# Patient Record
Sex: Female | Born: 1950 | Race: White | Hispanic: No | Smoking: Former smoker
Health system: Southern US, Community
[De-identification: ages and names within clinical notes are randomized; demographics above are authoritative.]

## PROBLEM LIST (undated history)

## (undated) DIAGNOSIS — E039 Hypothyroidism, unspecified: Secondary | ICD-10-CM

## (undated) DIAGNOSIS — I10 Essential (primary) hypertension: Secondary | ICD-10-CM

## (undated) DIAGNOSIS — E119 Type 2 diabetes mellitus without complications: Secondary | ICD-10-CM

## (undated) DIAGNOSIS — E785 Hyperlipidemia, unspecified: Secondary | ICD-10-CM

## (undated) DIAGNOSIS — R011 Cardiac murmur, unspecified: Secondary | ICD-10-CM

## (undated) HISTORY — DX: Type 2 diabetes mellitus without complications: E11.9

## (undated) HISTORY — DX: Cardiac murmur, unspecified: R01.1

## (undated) HISTORY — DX: Hypothyroidism, unspecified: E03.9

## (undated) HISTORY — DX: Essential (primary) hypertension: I10

## (undated) HISTORY — PX: BREAST SURGERY: SHX581

## (undated) HISTORY — DX: Hyperlipidemia, unspecified: E78.5

---

## 1989-04-22 HISTORY — PX: ROBOTIC ASSISTED LAPAROSCOPIC VAGINAL HYSTERECTOMY WITH FIBROID REMOVAL: SHX5387

## 1997-09-29 ENCOUNTER — Other Ambulatory Visit: Admission: RE | Admit: 1997-09-29 | Discharge: 1997-09-29 | Payer: Self-pay | Admitting: Obstetrics and Gynecology

## 1998-04-22 HISTORY — PX: ABDOMINAL HYSTERECTOMY: SHX81

## 1998-08-09 ENCOUNTER — Other Ambulatory Visit: Admission: RE | Admit: 1998-08-09 | Discharge: 1998-08-09 | Payer: Self-pay | Admitting: Obstetrics and Gynecology

## 1998-09-11 ENCOUNTER — Other Ambulatory Visit: Admission: RE | Admit: 1998-09-11 | Discharge: 1998-09-11 | Payer: Self-pay | Admitting: Obstetrics and Gynecology

## 1998-10-03 ENCOUNTER — Inpatient Hospital Stay (HOSPITAL_COMMUNITY): Admission: RE | Admit: 1998-10-03 | Discharge: 1998-10-06 | Payer: Self-pay | Admitting: Obstetrics and Gynecology

## 2000-01-30 ENCOUNTER — Other Ambulatory Visit: Admission: RE | Admit: 2000-01-30 | Discharge: 2000-01-30 | Payer: Self-pay | Admitting: Obstetrics and Gynecology

## 2009-04-22 DIAGNOSIS — C50919 Malignant neoplasm of unspecified site of unspecified female breast: Secondary | ICD-10-CM | POA: Insufficient documentation

## 2009-04-22 HISTORY — PX: MASTECTOMY: SHX3

## 2009-04-22 HISTORY — DX: Malignant neoplasm of unspecified site of unspecified female breast: C50.919

## 2017-04-22 HISTORY — PX: CATARACT EXTRACTION: SUR2

## 2017-12-29 LAB — HM DEXA SCAN

## 2019-03-30 LAB — CBC AND DIFFERENTIAL
HCT: 40 (ref 36–46)
Hemoglobin: 13.6 (ref 12.0–16.0)
Platelets: 230 (ref 150–399)
WBC: 6.6

## 2019-03-30 LAB — CBC: RBC: 4.63 (ref 3.87–5.11)

## 2019-03-30 LAB — BASIC METABOLIC PANEL
BUN: 12 (ref 4–21)
CO2: 28 — AB (ref 13–22)
Chloride: 95 — AB (ref 99–108)
Creatinine: 0.7 (ref 0.5–1.1)
Glucose: 125
Potassium: 4.1 (ref 3.4–5.3)
Sodium: 132 — AB (ref 137–147)

## 2019-03-30 LAB — HEPATIC FUNCTION PANEL
ALT: 14 (ref 7–35)
AST: 15 (ref 13–35)
Alkaline Phosphatase: 51 (ref 25–125)
Bilirubin, Total: 0.6

## 2019-03-30 LAB — HEMOGLOBIN A1C: Hemoglobin A1C: 6.9

## 2019-03-31 LAB — COMPREHENSIVE METABOLIC PANEL
Albumin: 4.7 (ref 3.5–5.0)
Calcium: 10 (ref 8.7–10.7)
GFR calc Af Amer: 100
GFR calc non Af Amer: 86
Globulin: 2.6

## 2019-03-31 LAB — LIPID PANEL
Cholesterol: 153 (ref 0–200)
HDL: 64 (ref 35–70)
LDL Cholesterol: 69
Triglycerides: 110 (ref 40–160)

## 2019-03-31 LAB — MICROALBUMIN, URINE: Microalb, Ur: NORMAL

## 2019-07-22 HISTORY — PX: BELPHAROPTOSIS REPAIR: SHX369

## 2019-11-05 LAB — MICROALBUMIN, URINE: Microalb, Ur: NORMAL

## 2019-11-05 LAB — HEMOGLOBIN A1C: Hemoglobin A1C: 7.5

## 2020-02-08 ENCOUNTER — Other Ambulatory Visit: Payer: Self-pay

## 2020-02-08 ENCOUNTER — Encounter: Payer: Self-pay | Admitting: Physician Assistant

## 2020-02-08 ENCOUNTER — Ambulatory Visit (INDEPENDENT_AMBULATORY_CARE_PROVIDER_SITE_OTHER): Payer: Medicare Other | Admitting: Physician Assistant

## 2020-02-08 VITALS — BP 152/85 | HR 86 | Temp 97.7°F | Ht 65.0 in | Wt 201.4 lb

## 2020-02-08 DIAGNOSIS — I10 Essential (primary) hypertension: Secondary | ICD-10-CM | POA: Insufficient documentation

## 2020-02-08 DIAGNOSIS — I700 Atherosclerosis of aorta: Secondary | ICD-10-CM | POA: Insufficient documentation

## 2020-02-08 DIAGNOSIS — E119 Type 2 diabetes mellitus without complications: Secondary | ICD-10-CM | POA: Insufficient documentation

## 2020-02-08 DIAGNOSIS — M8580 Other specified disorders of bone density and structure, unspecified site: Secondary | ICD-10-CM | POA: Insufficient documentation

## 2020-02-08 DIAGNOSIS — E039 Hypothyroidism, unspecified: Secondary | ICD-10-CM | POA: Insufficient documentation

## 2020-02-08 DIAGNOSIS — E785 Hyperlipidemia, unspecified: Secondary | ICD-10-CM | POA: Insufficient documentation

## 2020-02-08 DIAGNOSIS — I890 Lymphedema, not elsewhere classified: Secondary | ICD-10-CM | POA: Insufficient documentation

## 2020-02-08 DIAGNOSIS — R011 Cardiac murmur, unspecified: Secondary | ICD-10-CM | POA: Insufficient documentation

## 2020-02-08 DIAGNOSIS — R918 Other nonspecific abnormal finding of lung field: Secondary | ICD-10-CM | POA: Insufficient documentation

## 2020-02-08 NOTE — Patient Instructions (Signed)
It was great to see you!  Please make an appointment with the lab on your way out. I would like for you to return for lab work within 1-2 weeks. After midnight on the day of the lab draw, please do not eat anything. You may have water, black coffee, unsweetened tea.  Please make an appointment for no sooner than 2 days after labwork for [place] to review your labs in the office and review your medications.  Take care,  Inda Coke PA-C

## 2020-02-08 NOTE — Progress Notes (Signed)
Amanda Rollins is a 69 y.o. female here for a new problem.  History of Present Illness:   Chief Complaint  Patient presents with   Establish Care   Weight Gain    HPI   Diabetes Current DM meds: amaryl 4 mg daily. Has been on metformin prior but had significant diarrhea -- cannot tolerate. Patient is compliant with medications. Denies: hypoglycemic or hyperglycemic episodes or symptoms. This patient's diabetes is complicated by HTN and HLD.  Osteopenia DEXA showed osteoporosis, per patient. She has been on fosamax for a few months and is tolerating well so far. We do not have record of most recent DEXA.  HTN Currently taking Norvasc 5 mg, Losartan-HCTZ 100-12.5 mg. At home blood pressure readings are: well controlled per patient report. Patient denies chest pain, SOB, blurred vision, dizziness, unusual headaches, lower leg swelling. Patient is compliant with medication. Denies excessive caffeine intake, stimulant usage, excessive alcohol intake, or increase in salt consumption.  BP Readings from Last 3 Encounters:  02/08/20 (!) 152/85   HLD Currently on crestor 5 mg daily. Has been on for years. Tolerating well, denies myalgias.  Hypothyroidism Currently on synthroid 50 mcg daily. Tolerating well. Reports that her labs have been stable for a few years.  Past Medical History:  Diagnosis Date   Breast cancer ([place]) 2011   Diabetes mellitus type 2, controlled ([place])    Heart murmur    Hyperlipidemia    Hypertension    Hypothyroidism      Social History   Tobacco Use   Smoking status: Former Smoker    Start date: 02/07/1970   Smokeless tobacco: Never Used  Substance Use Topics   Alcohol use: Yes    Alcohol/week: 3.0 standard drinks    Types: 3 Glasses of wine per week   Drug use: Never    Past Surgical History:  Procedure Laterality Date   ABDOMINAL HYSTERECTOMY  2000   Still has ovaries   BELPHAROPTOSIS REPAIR  07/2019   BREAST SURGERY      CATARACT EXTRACTION  2019   Left eye 2019, Right eye 2020   CESAREAN SECTION  12/08/1990   MASTECTOMY  2011   Bilateral   ROBOTIC ASSISTED LAPAROSCOPIC VAGINAL HYSTERECTOMY WITH FIBROID REMOVAL  1991    Family History  Adopted: Yes  Problem Relation Age of Onset   Cancer Mother        small cell cancer of liver and lungs    No Known Allergies  Current Medications:   Current Outpatient Medications:    alendronate (FOSAMAX) 70 MG tablet, Take 70 mg by mouth once a week., Disp: , Rfl:    amLODipine (NORVASC) 5 MG tablet, Take 5 mg by mouth daily., Disp: , Rfl:    calcium-vitamin D (OSCAL WITH D) 250-125 MG-UNIT tablet, Take 1 tablet by mouth daily., Disp: , Rfl:    glimepiride (AMARYL) 4 MG tablet, Take 4 mg by mouth daily with breakfast., Disp: , Rfl:    levothyroxine (SYNTHROID) 50 MCG tablet, Take 50 mcg by mouth daily., Disp: , Rfl:    losartan-hydrochlorothiazide (HYZAAR) 100-12.5 MG tablet, Take 1 tablet by mouth daily., Disp: , Rfl:    magnesium gluconate (MAGONATE) 500 MG tablet, Take 500 mg by mouth 2 (two) times daily., Disp: , Rfl:    rosuvastatin (CRESTOR) 5 MG tablet, Take 5 mg by mouth daily., Disp: , Rfl:    Review of Systems:   ROS  Negative unless otherwise specified per HPI.  Vitals:  Vitals:   02/08/20 1034  BP: (!) 152/85  Pulse: 86  Temp: 97.7 F (36.5 C)  TempSrc: Temporal  SpO2: 97%  Weight: 201 lb 6.4 oz (91.4 kg)  Height: 5\' 5"  (1.651 m)     Body mass index is 33.51 kg/m.  Physical Exam:   Physical Exam Vitals and nursing note reviewed.  Constitutional:      General: She is not in acute distress.    Appearance: She is well-developed. She is not ill-appearing or toxic-appearing.  Cardiovascular:     Rate and Rhythm: Normal rate and regular rhythm.     Pulses: Normal pulses.     Heart sounds: Normal heart sounds, S1 normal and S2 normal.     Comments: No LE edema Pulmonary:     Effort: Pulmonary effort is normal.      Breath sounds: Normal breath sounds.  Skin:    General: Skin is warm and dry.     Findings: Erythema:    Neurological:     Mental Status: She is alert.     GCS: GCS eye subscore is 4. GCS verbal subscore is 5. GCS motor subscore is 6.  Psychiatric:        Speech: Speech normal.        Behavior: Behavior normal. Behavior is cooperative.     No results found for this or any previous visit.  Assessment and Plan:   B[place] was seen today for establish care and weight gain.  Diagnoses and all orders for this visit:  Controlled type 2 diabetes mellitus without complication, without long-term current use of insulin ([place]) She would like for [place] to await her records (which were done in June 2021?) to determine if she needs repeat labs now or in January.  Update HgbA1c when indicated. Will trial stopping glimepiride and adding GLP-1 for weight loss if agreeable.  Primary hypertension Currently above goal. Will continue current regimen of Losartan 100 - HCTZ 12.5 mg and Norvasc 5 mg daily. If remains elevated at follow-up, will increase Norvasc to 10 mg daily. If BP consistently > 150/90, return to office, otherwise follow-up for scheduled follow-ups.  Hyperlipidemia, unspecified hyperlipidemia type She would like for [place] to await her records (which were done in June 2021?) to determine if she needs repeat labs now or in January.  Will adjust crestor 5 mg as indicated.  Acquired hypothyroidism She would like for [place] to await her records (which were done in June 2021?) to determine if she needs repeat labs now or in January.  Will adjust synthroid 50 mcg as indicated.  Osteopenia, unspecified location Has been on fosamax for a few months. Will await records to review when last DEXA was to schedule next.  Time spent with patient today was 50 minutes which consisted of chart review, discussing diagnosis, work up, treatment answering questions and documentation.   Inda Coke, PA-C

## 2020-02-13 ENCOUNTER — Encounter: Payer: Self-pay | Admitting: Physician Assistant

## 2020-03-27 ENCOUNTER — Other Ambulatory Visit: Payer: Self-pay

## 2020-03-27 ENCOUNTER — Other Ambulatory Visit: Payer: Self-pay | Admitting: Physician Assistant

## 2020-03-27 ENCOUNTER — Encounter: Payer: Self-pay | Admitting: Physician Assistant

## 2020-03-27 ENCOUNTER — Ambulatory Visit (INDEPENDENT_AMBULATORY_CARE_PROVIDER_SITE_OTHER): Payer: Medicare Other | Admitting: Physician Assistant

## 2020-03-27 VITALS — BP 151/76 | HR 82 | Temp 97.8°F | Ht 65.0 in | Wt 197.6 lb

## 2020-03-27 DIAGNOSIS — M8580 Other specified disorders of bone density and structure, unspecified site: Secondary | ICD-10-CM

## 2020-03-27 DIAGNOSIS — I10 Essential (primary) hypertension: Secondary | ICD-10-CM | POA: Diagnosis not present

## 2020-03-27 DIAGNOSIS — E119 Type 2 diabetes mellitus without complications: Secondary | ICD-10-CM

## 2020-03-27 DIAGNOSIS — E785 Hyperlipidemia, unspecified: Secondary | ICD-10-CM

## 2020-03-27 DIAGNOSIS — Z23 Encounter for immunization: Secondary | ICD-10-CM | POA: Diagnosis not present

## 2020-03-27 DIAGNOSIS — E039 Hypothyroidism, unspecified: Secondary | ICD-10-CM

## 2020-03-27 MED ORDER — ALENDRONATE SODIUM 70 MG PO TABS: 70.0000 mg | ORAL_TABLET | ORAL | 1 refills | Status: DC

## 2020-03-27 MED ORDER — AMLODIPINE BESYLATE 5 MG PO TABS: 5.0000 mg | ORAL_TABLET | Freq: Every day | ORAL | 3 refills | Status: DC

## 2020-03-27 MED ORDER — LOSARTAN POTASSIUM-HCTZ 100-12.5 MG PO TABS: 1.0000 | ORAL_TABLET | Freq: Every day | ORAL | 3 refills | Status: DC

## 2020-03-27 MED ORDER — FLUTICASONE PROPIONATE 50 MCG/ACT NA SUSP: 2.0000 | Freq: Every day | NASAL | 2 refills | Status: DC

## 2020-03-27 NOTE — Progress Notes (Signed)
Amanda Rollins is a 69 y.o. female here for a new problem.  History of Present Illness:   Chief Complaint  Patient presents with  . Ear Pain    Bilateral ear pain, pt states 2 weeks ago when she was flying once she got in the air it felt like her ears were going to explode. States her ears has been itching, burning and a ringing noise   . Medication Refill    Fosamax, Crestor and Norvasc    HPI   Ear pain Bilateral ear pain, pt states 2 weeks ago when she was flying once she got in the air it felt like her ears were going to explode. States her ears has been itching, burning and a ringing noise. Has constant noise at baseline so this is not new for her. This happened to her once in the past when she was in airplane travel as well, went away on its own. Denies: nasal congestion, HA, dizziness.  HTN Currently taking Losartan-HCTZ 100-12.5 mg, Norvasc 5 mg. At home blood pressure readings are: not checked. Patient denies chest pain, SOB, blurred vision, dizziness, unusual headaches, lower leg swelling. Patient is compliant with medication -- except for this week she has been out of norvasc 5 mg daily. Denies excessive caffeine intake, stimulant usage, excessive alcohol intake, or increase in salt consumption.  BP Readings from Last 3 Encounters:  03/27/20 (!) 151/76  02/08/20 (!) 152/85   HLD Currently on crestor 5 mg daily. Tolerates without any issues. Due for lipid recheck.  Hypothyroidism Currently on levothyroxine 50 mcg daily. Tolerating well. Would like her TSH rechecked today. Denies any concerning symptoms regarding lack of thyroid control (dry hair, changes in skin, etc.)  Diabetes 6 month follow-up. Current DM meds: amaryl 4 mg daily. Blood sugars at home are: not checked. Patient is compliant with medications. Denies: hypoglycemic or hyperglycemic episodes or symptoms.  Lab Results  Component Value Date   HGBA1C 6.9 03/30/2019   Osteopenia Last DEXA was Sep 2019,  showed osteopenia. She is taking fosamax regularly, denies concerns with this medication. Takes regular Vit D and calcium supplement.    Past Medical History:  Diagnosis Date  . Breast cancer ([place]) 2011  . Diabetes mellitus type 2, controlled ([place])   . Heart murmur   . Hyperlipidemia   . Hypertension   . Hypothyroidism      Social History   Tobacco Use  . Smoking status: Former Smoker    Start date: 02/07/1970  . Smokeless tobacco: Never Used  Substance Use Topics  . Alcohol use: Yes    Alcohol/week: 3.0 standard drinks    Types: 3 Glasses of wine per week  . Drug use: Never    Past Surgical History:  Procedure Laterality Date  . ABDOMINAL HYSTERECTOMY  2000   Still has ovaries  . [place]  07/2019  . BREAST SURGERY    . CATARACT EXTRACTION  2019   Left eye 2019, Right eye 2020  . CESAREAN SECTION  12/08/1990  . MASTECTOMY  2011   Bilateral  . ROBOTIC ASSISTED LAPAROSCOPIC VAGINAL HYSTERECTOMY WITH FIBROID REMOVAL  1991    Family History  Adopted: Yes  Problem Relation Age of Onset  . Cancer Mother        small cell cancer of liver and lungs    No Known Allergies  Current Medications:   Current Outpatient Medications:  .  alendronate (FOSAMAX) 70 MG tablet, Take 1 tablet (70 mg total) by mouth  once a week., Disp: 30 tablet, Rfl: 1 .  amLODipine (NORVASC) 5 MG tablet, Take 1 tablet (5 mg total) by mouth daily., Disp: 90 tablet, Rfl: 3 .  calcium-vitamin D (OSCAL WITH D) 250-125 MG-UNIT tablet, Take 1 tablet by mouth daily., Disp: , Rfl:  .  glimepiride (AMARYL) 4 MG tablet, Take 4 mg by mouth daily with breakfast., Disp: , Rfl:  .  levothyroxine (SYNTHROID) 50 MCG tablet, Take 50 mcg by mouth daily., Disp: , Rfl:  .  losartan-hydrochlorothiazide (HYZAAR) 100-12.5 MG tablet, Take 1 tablet by mouth daily., Disp: 90 tablet, Rfl: 3 .  magnesium gluconate (MAGONATE) 500 MG tablet, Take 500 mg by mouth 2 (two) times daily., Disp: , Rfl:  .   rosuvastatin (CRESTOR) 5 MG tablet, Take 5 mg by mouth daily., Disp: , Rfl:  .  fluticasone (FLONASE) 50 MCG/ACT nasal spray, Place 2 sprays into both nostrils daily., Disp: 16 g, Rfl: 2   Review of Systems:   ROS Negative unless otherwise specified per HPI.  Vitals:   Vitals:   03/27/20 0932  BP: (!) 151/76  Pulse: 82  Temp: 97.8 F (36.6 C)  TempSrc: Temporal  SpO2: 97%  Weight: 197 lb 9.6 oz (89.6 kg)  Height: 5\' 5"  (1.651 m)     Body mass index is 32.88 kg/m.  Physical Exam:   Physical Exam Vitals and nursing note reviewed.  Constitutional:      General: She is not in acute distress.    Appearance: She is well-developed. She is not ill-appearing or toxic-appearing.  HENT:     Head: Normocephalic and atraumatic.     Right Ear: Tympanic membrane, ear canal and external ear normal. Tympanic membrane is not erythematous, retracted or bulging.     Left Ear: Tympanic membrane, ear canal and external ear normal. Tympanic membrane is not erythematous, retracted or bulging.     Nose: Nose normal.     Right Sinus: No maxillary sinus tenderness or frontal sinus tenderness.     Left Sinus: No maxillary sinus tenderness or frontal sinus tenderness.     Mouth/Throat:     Pharynx: Uvula midline. No posterior oropharyngeal erythema.  Eyes:     General: Lids are normal.     Conjunctiva/sclera: Conjunctivae normal.  Neck:     Trachea: Trachea normal.  Cardiovascular:     Rate and Rhythm: Normal rate and regular rhythm.     Pulses: Normal pulses.     Heart sounds: Normal heart sounds, S1 normal and S2 normal.     Comments: No LE edema Pulmonary:     Effort: Pulmonary effort is normal.     Breath sounds: Normal breath sounds. No decreased breath sounds, wheezing, rhonchi or rales.  Lymphadenopathy:     Cervical: No cervical adenopathy.  Skin:    General: Skin is warm and dry.  Neurological:     Mental Status: She is alert.     GCS: GCS eye subscore is 4. GCS verbal subscore  is 5. GCS motor subscore is 6.  Psychiatric:        Speech: Speech normal.        Behavior: Behavior normal. Behavior is cooperative.       Assessment and Plan:   Marieclaire was seen today for ear pain and medication refill.  Diagnoses and all orders for this visit:  Osteopenia, unspecified location Due for updated DEXA. Will repeat DEXA. Continue fosamax, vit D and calcium supplementation. Further recommendations based on DEXA results. -  DG Bone Density; Future  Primary hypertension Above goal today, but has been without norvasc x 1 week. Resume norvasc 5 mg daily and losartan-hctz 100-12.5 mg daily. Follow-up in 4 months, sooner if concerns.  Hyperlipidemia, unspecified hyperlipidemia type Update lipid panel today and adjust crestor 5 mg daily as indicated. -     CBC with Differential/Platelet; Future -     Comprehensive metabolic panel; Future -     Lipid panel; Future  Controlled type 2 diabetes mellitus without complication, without long-term current use of insulin ([facility]) Update A1c today. Likely stop amaryl and trial GLP-1. She is injection-resistant, will possibly sample rybelsus. Follow-up in 4 months, sooner if concerns.  Acquired hypothyroidism Update TSH today and adjust 50 mcg synthroid as indicated by TSH. -     TSH; Future  Other orders -     fluticasone (FLONASE) 50 MCG/ACT nasal spray; Place 2 sprays into both nostrils daily. -     losartan-hydrochlorothiazide (HYZAAR) 100-12.5 MG tablet; Take 1 tablet by mouth daily. -     amLODipine (NORVASC) 5 MG tablet; Take 1 tablet (5 mg total) by mouth daily. -     alendronate (FOSAMAX) 70 MG tablet; Take 1 tablet (70 mg total) by mouth once a week.   Inda Coke, PA-C

## 2020-03-27 NOTE — Patient Instructions (Addendum)
It was great to see you!  For your ears: trial flonase for possible eustachian tube dysfunction.  Labs today.  Follow-up in 4 months.  Please schedule your DEXA scan on the way out.  Eustachian Tube Dysfunction  Eustachian tube dysfunction refers to a condition in which a blockage develops in the narrow passage that connects the middle ear to the back of the nose (eustachian tube). The eustachian tube regulates air pressure in the middle ear by letting air move between the ear and nose. It also helps to drain fluid from the middle ear space. Eustachian tube dysfunction can affect one or both ears. When the eustachian tube does not function properly, air pressure, fluid, or both can build up in the middle ear. What are the causes? This condition occurs when the eustachian tube becomes blocked or cannot open normally. Common causes of this condition include:  Ear infections.  Colds and other infections that affect the nose, mouth, and throat (upper respiratory tract).  Allergies.  Irritation from cigarette smoke.  Irritation from stomach acid coming up into the esophagus (gastroesophageal reflux). The esophagus is the tube that carries food from the mouth to the stomach.  Sudden changes in air pressure, such as from descending in an airplane or scuba diving.  Abnormal growths in the nose or throat, such as: ? Growths that line the nose (nasal polyps). ? Abnormal growth of cells (tumors). ? Enlarged tissue at the back of the throat (adenoids). What increases the risk? You are more likely to develop this condition if:  You smoke.  You are overweight.  You are a child who has: ? Certain birth defects of the mouth, such as cleft palate. ? Large tonsils or adenoids. What are the signs or symptoms? Common symptoms of this condition include:  A feeling of fullness in the ear.  Ear pain.  Clicking or popping noises in the ear.  Ringing in the ear.  Hearing loss.  Loss of  balance.  Dizziness. Symptoms may get worse when the air pressure around you changes, such as when you travel to an area of high elevation, fly on an airplane, or go scuba diving. How is this diagnosed? This condition may be diagnosed based on:  Your symptoms.  A physical exam of your ears, nose, and throat.  Tests, such as those that measure: ? The movement of your eardrum (tympanogram). ? Your hearing (audiometry). How is this treated? Treatment depends on the cause and severity of your condition.  In mild cases, you may relieve your symptoms by moving air into your ears. This is called "popping the ears."  In more severe cases, or if you have symptoms of fluid in your ears, treatment may include: ? Medicines to relieve congestion (decongestants). ? Medicines that treat allergies (antihistamines). ? Nasal sprays or ear drops that contain medicines that reduce swelling (steroids). ? A procedure to drain the fluid in your eardrum (myringotomy). In this procedure, a small tube is placed in the eardrum to:  Drain the fluid.  Restore the air in the middle ear space. ? A procedure to insert a balloon device through the nose to inflate the opening of the eustachian tube (balloon dilation). Follow these instructions at home: Lifestyle  Do not do any of the following until your health care provider approves: ? Travel to high altitudes. ? Fly in airplanes. ? Work in a Pension scheme manager or room. ? Scuba dive.  Do not use any products that contain nicotine or tobacco, such  as cigarettes and e-cigarettes. If you need help quitting, ask your health care provider.  Keep your ears dry. Wear fitted earplugs during showering and bathing. Dry your ears completely after. General instructions  Take over-the-counter and prescription medicines only as told by your health care provider.  Use techniques to help pop your ears as recommended by your health care provider. These may include: ?  Chewing gum. ? Yawning. ? Frequent, forceful swallowing. ? Closing your mouth, holding your nose closed, and gently blowing as if you are trying to blow air out of your nose.  Keep all follow-up visits as told by your health care provider. This is important. Contact a health care provider if:  Your symptoms do not go away after treatment.  Your symptoms come back after treatment.  You are unable to pop your ears.  You have: ? A fever. ? Pain in your ear. ? Pain in your head or neck. ? Fluid draining from your ear.  Your hearing suddenly changes.  You become very dizzy.  You lose your balance. Summary  Eustachian tube dysfunction refers to a condition in which a blockage develops in the eustachian tube.  It can be caused by ear infections, allergies, inhaled irritants, or abnormal growths in the nose or throat.  Symptoms include ear pain, hearing loss, or ringing in the ears.  Mild cases are treated with maneuvers to unblock the ears, such as yawning or ear popping.  Severe cases are treated with medicines. Surgery may also be done (rare). This information is not intended to replace advice given to you by your health care provider. Make sure you discuss any questions you have with your health care provider. Document Revised: 07/29/2017 Document Reviewed: 07/29/2017 Elsevier Patient Education  [place].

## 2020-03-28 ENCOUNTER — Other Ambulatory Visit: Payer: Self-pay | Admitting: Physician Assistant

## 2020-03-28 LAB — CBC WITH DIFFERENTIAL/PLATELET
Absolute Monocytes: 635 cells/uL (ref 200–950)
Basophils Absolute: 73 cells/uL (ref 0–200)
Basophils Relative: 1 %
Eosinophils Absolute: 110 cells/uL (ref 15–500)
Eosinophils Relative: 1.5 %
HCT: 42.1 % (ref 35.0–45.0)
Hemoglobin: 14.1 g/dL (ref 11.7–15.5)
Lymphs Abs: 1409 cells/uL (ref 850–3900)
MCH: 29.1 pg (ref 27.0–33.0)
MCHC: 33.5 g/dL (ref 32.0–36.0)
MCV: 87 fL (ref 80.0–100.0)
MPV: 10.5 fL (ref 7.5–12.5)
Monocytes Relative: 8.7 %
Neutro Abs: 5074 cells/uL (ref 1500–7800)
Neutrophils Relative %: 69.5 %
Platelets: 254 10*3/uL (ref 140–400)
RBC: 4.84 10*6/uL (ref 3.80–5.10)
RDW: 12.4 % (ref 11.0–15.0)
Total Lymphocyte: 19.3 %
WBC: 7.3 10*3/uL (ref 3.8–10.8)

## 2020-03-28 LAB — HEMOGLOBIN A1C
Hgb A1c MFr Bld: 7.5 % of total Hgb — ABNORMAL HIGH (ref <5.7)
Mean Plasma Glucose: 169 mg/dL
eAG (mmol/L): 9.3 mmol/L

## 2020-03-28 LAB — COMPREHENSIVE METABOLIC PANEL
AG Ratio: 1.8 (calc) (ref 1.0–2.5)
ALT: 13 U/L (ref 6–29)
AST: 14 U/L (ref 10–35)
Albumin: 4.5 g/dL (ref 3.6–5.1)
Alkaline phosphatase (APISO): 36 U/L — ABNORMAL LOW (ref 37–153)
BUN: 16 mg/dL (ref 7–25)
CO2: 29 mmol/L (ref 20–32)
Calcium: 10 mg/dL (ref 8.6–10.4)
Chloride: 100 mmol/L (ref 98–110)
Creat: 0.78 mg/dL (ref 0.60–0.93)
Globulin: 2.5 g/dL (calc) (ref 1.9–3.7)
Glucose, Bld: 170 mg/dL — ABNORMAL HIGH (ref 65–99)
Potassium: 4.1 mmol/L (ref 3.5–5.3)
Sodium: 138 mmol/L (ref 135–146)
Total Bilirubin: 0.5 mg/dL (ref 0.2–1.2)
Total Protein: 7 g/dL (ref 6.1–8.1)

## 2020-03-28 LAB — LIPID PANEL
Cholesterol: 167 mg/dL (ref <200)
HDL: 64 mg/dL (ref >=50)
LDL Cholesterol (Calc): 75 mg/dL (calc)
Non-HDL Cholesterol (Calc): 103 mg/dL (calc) (ref <130)
Total CHOL/HDL Ratio: 2.6 (calc) (ref <5.0)
Triglycerides: 181 mg/dL — ABNORMAL HIGH (ref <150)

## 2020-03-28 LAB — TSH: TSH: 2.49 mIU/L (ref 0.40–4.50)

## 2020-03-28 MED ORDER — ROSUVASTATIN CALCIUM 5 MG PO TABS: 5.0000 mg | ORAL_TABLET | Freq: Every day | ORAL | 3 refills | Status: DC

## 2020-03-30 ENCOUNTER — Inpatient Hospital Stay: Admission: RE | Admit: 2020-03-30 | Payer: Medicare Other | Source: Ambulatory Visit

## 2020-04-03 ENCOUNTER — Ambulatory Visit (INDEPENDENT_AMBULATORY_CARE_PROVIDER_SITE_OTHER)
Admission: RE | Admit: 2020-04-03 | Discharge: 2020-04-03 | Disposition: A | Discharge status: Home / Self Care | Payer: Medicare Other | Source: Ambulatory Visit | Attending: Physician Assistant | Admitting: Physician Assistant

## 2020-04-03 ENCOUNTER — Other Ambulatory Visit: Payer: Self-pay

## 2020-04-03 DIAGNOSIS — M8580 Other specified disorders of bone density and structure, unspecified site: Secondary | ICD-10-CM | POA: Diagnosis not present

## 2020-04-04 ENCOUNTER — Encounter: Payer: Self-pay | Admitting: Family Medicine

## 2020-04-04 ENCOUNTER — Other Ambulatory Visit: Payer: Self-pay

## 2020-04-04 ENCOUNTER — Telehealth: Payer: Medicare Other | Admitting: Family Medicine

## 2020-04-04 ENCOUNTER — Ambulatory Visit (INDEPENDENT_AMBULATORY_CARE_PROVIDER_SITE_OTHER): Payer: Medicare Other | Admitting: Family Medicine

## 2020-04-04 VITALS — BP 136/86 | HR 98 | Temp 98.4°F | Ht 65.0 in | Wt 197.0 lb

## 2020-04-04 DIAGNOSIS — L03113 Cellulitis of right upper limb: Secondary | ICD-10-CM | POA: Diagnosis not present

## 2020-04-04 DIAGNOSIS — R519 Headache, unspecified: Secondary | ICD-10-CM

## 2020-04-04 DIAGNOSIS — R229 Localized swelling, mass and lump, unspecified: Secondary | ICD-10-CM

## 2020-04-04 DIAGNOSIS — R110 Nausea: Secondary | ICD-10-CM

## 2020-04-04 MED ORDER — CEPHALEXIN 500 MG PO CAPS: 500.0000 mg | ORAL_CAPSULE | Freq: Four times a day (QID) | ORAL | 0 refills | Status: AC

## 2020-04-04 NOTE — Progress Notes (Signed)
Virtual Visit via Telephone Note  I connected with Amanda Rollins on 04/04/20 at  1:00 PM EST by telephone and verified that I am speaking with the correct person using two identifiers.   I discussed the limitations, risks, security and privacy concerns of performing an evaluation and management service by telephone and the availability of in person appointments. I also discussed with the patient that there may be a patient responsible charge related to this service. The patient expressed understanding and agreed to proceed.  Location patient: home, [name] Location provider: work or home office Participants present for the call: patient, provider Patient did not have a visit with me in the prior 7 days to address this/these issue(s).   History of Present Illness:  Acute telemedicine visit for swelling and redness of the skin: -has hx of mild lymphedema in the R arm from remote surgery for breast ca  -however, starting last night noticed redness and hot, tender and swollen skin in the arm that has spread up the entire arm and now is extending onto the chest, mild HA and some nausea -denies fever, malaise   Observations/Objective: Patient sounds cheerful and well on the phone. I do not appreciate any SOB. Speech and thought processing are grossly intact. Patient reported vitals:  Assessment and Plan:  Skin swelling  Nonintractable headache, unspecified chronicity pattern, unspecified headache type  Nausea  -we discussed possible serious and likely etiologies, options for evaluation and workup, limitations of telemedicine visit vs in person visit, treatment, treatment risks and precautions. Pt prefers to treat via telemedicine empirically rather than in person at this moment. However, given rapid spreading and severity of symptoms advised prompt inperson evaluation right away today. She reports PCP office if full. Discussed other options for inperson care, she agrees to go. Since she was  not able to do video visit do to tech issues and requires inperson care, advised will not charge for this visit.    Lucretia Kern, DO

## 2020-04-04 NOTE — Patient Instructions (Signed)
Seek inperson evaluation today, right away.   I hope you are feeling better soon!  It was nice to meet you today. I help [facility] out with telemedicine visits on Tuesdays and Thursdays and am available for visits on those days. If you have any concerns or questions following this visit please schedule a follow up visit with your Primary Care doctor or seek care at a local urgent care clinic to avoid delays in care.

## 2020-04-04 NOTE — Progress Notes (Signed)
Phone [phone] In person visit   Subjective:   Amanda Rollins is a 69 y.o. year old very pleasant female patient who presents for/with See problem oriented charting Chief Complaint  Patient presents with  . lymphedema    Pt R arm is swollen chest and stomach is red. Pt states the areas are hot, mild burning and the elbow kind of hurt   . Headache    Pt complains of a mild headache    This visit occurred during the SARS-CoV-2 public health emergency.  Safety protocols were in place, including screening questions prior to the visit, additional usage of staff PPE, and extensive cleaning of exam room while observing appropriate contact time as indicated for disinfecting solutions.   Past Medical History-  Patient Active Problem List   Diagnosis Date Noted  . Osteopenia 02/08/2020  . Lymphedema 02/08/2020  . Pulmonary nodules 02/08/2020  . Atherosclerosis of abdominal aorta ([place]) 02/08/2020  . Hypothyroidism   . Hypertension   . Hyperlipidemia   . Heart murmur   . Diabetes mellitus type 2, controlled ([place])   . Breast cancer ([place]) 2011    Medications- reviewed and updated Current Outpatient Medications  Medication Sig Dispense Refill  . alendronate (FOSAMAX) 70 MG tablet TAKE 1 TABLET BY MOUTH ONCE A WEEK 12 tablet 3  . amLODipine (NORVASC) 5 MG tablet Take 1 tablet (5 mg total) by mouth daily. 90 tablet 3  . calcium-vitamin D (OSCAL WITH D) 250-125 MG-UNIT tablet Take 1 tablet by mouth daily.    . fluticasone (FLONASE) 50 MCG/ACT nasal spray SHAKE LIQUID AND USE 2 SPRAYS IN EACH NOSTRIL DAILY 16 g 2  . levothyroxine (SYNTHROID) 50 MCG tablet Take 50 mcg by mouth daily.    Marland Kitchen losartan-hydrochlorothiazide (HYZAAR) 100-12.5 MG tablet Take 1 tablet by mouth daily. 90 tablet 3  . magnesium gluconate (MAGONATE) 500 MG tablet Take 500 mg by mouth 2 (two) times daily.    . rosuvastatin (CRESTOR) 5 MG tablet Take 1 tablet (5 mg total) by mouth daily. 90 tablet 3  . cephALEXin  (KEFLEX) 500 MG capsule Take 1 capsule (500 mg total) by mouth 4 (four) times daily for 7 days. 28 capsule 0   No current facility-administered medications for this visit.     Objective:  BP 136/86   Pulse 98   Temp 98.4 F (36.9 C) (Temporal)   Ht 5\' 5"  (1.651 m)   Wt 197 lb (89.4 kg)   SpO2 97%   BMI 32.78 kg/m  Gen: NAD, resting comfortably, well appearing CV: RRR no murmurs rubs or gallops Lungs: CTAB no crackles, wheeze, rhonchi Ext: significant edema, swelling, warmth in right arm compared to left- extends onto the right upper chest and even some onto abdomen.  Skin: warm, dry       Assessment and Plan   # Right arm swelling, redness, warmth, perhaps mild pain at elbow mild burning S:history of lymphedema in right arm after breast cancer treatment. Arm is always somewhat swollen- wears a compression sleeve at baseline as long as changes sleeve every 6 months . In the past had to see lymphedema specialist in the past and they used an unscented creme.   Last night put some lotion on legs and left arm- opted to put some on right arm (jergens body creme). In the past had a reaction when put a lotion on her stomach and saw dermatology and they recommended only extremities. Wonders if using scented lotion caused issue  Similar  episode of redness in the arm not as extreme that responded to antibiotics.   Has mild headache (but also just started rybelsus x 4 pills) and mild nausea (wondres if from rybelsus). Does not feel bad overall other than perhaps mild fatigue- did not feel like walking to office today and lives at complext next door- but felt ok to drive over and walk in. She didn't sleep well last night A/P: 69 year old female with history of right arm lymphedema after prior breast cancer treatment presenting with right arm swelling/redness/warmth concerning for potential cellulitis -Treat with Keflex 500 mg four times a day -Close follow-up with PCP on Friday.  Thankfully  PCP Inda Coke was able to see patient in the lab and visualize the arm before patient left today -I am suspicious that systemic symptoms of mild headache and nausea may be related to Rybelsus -This could be a reaction to Rybelsus itself though though I think unlikely -We will hold Rybelsus due to potential for causing some of above symptoms and reevaluate once this is cleared whether she can restart -Could also be reaction to scented lotion that she placed on the arm -We will get CBC and CMP though I doubt this would change management unless significant abnormalities  Recommended follow up: Friday follow up or ED if worsens Future Appointments  Date Time Provider [place]  04/07/2020  8:00 AM Inda Coke, [place] [facility]  07/26/2020  9:00 AM Inda Coke, [place] [facility]   Lab/Order associations:   ICD-10-CM   1. Cellulitis of right upper extremity  L03.113 CBC With Differential/Platelet    COMPLETE METABOLIC PANEL WITH GFR    COMPLETE METABOLIC PANEL WITH GFR    CBC With Differential/Platelet   Meds ordered this encounter  Medications  . cephALEXin (KEFLEX) 500 MG capsule    Sig: Take 1 capsule (500 mg total) by mouth 4 (four) times daily for 7 days.    Dispense:  28 capsule    Refill:  0   Return precautions advised.  Garret Reddish, MD

## 2020-04-04 NOTE — Patient Instructions (Addendum)
Hold rybelsus until swelling and redness improves/resolves.   Schedule follow up on Friday if possible with [facility] for recheck  Start keflex four times a day, as soon as you get it and then before bed tonight.   If worsening symptoms or fever, I do want you to go the hospital  Continue to avoid the new lotion.   Recommended follow up: Friday plus Return for as needed for new, worsening, persistent symptoms.   Please stop by lab before you go If you have mychart- we will send your results within 3 business days of [place] receiving them.  If you do not have mychart- we will call you about results within 5 business days of [place] receiving them.  *please note we are currently using [facility] which has a longer processing time than  typically so labs may not come back as quickly as in the past *please also note that you will see labs on mychart as soon as they post. I will later go in and write notes on them- will say "notes from Dr. Yong Channel"

## 2020-04-05 ENCOUNTER — Encounter: Payer: Self-pay | Admitting: Family Medicine

## 2020-04-05 ENCOUNTER — Encounter: Payer: Self-pay | Admitting: Physician Assistant

## 2020-04-05 DIAGNOSIS — F320 Major depressive disorder, single episode, mild: Secondary | ICD-10-CM | POA: Insufficient documentation

## 2020-04-05 LAB — COMPLETE METABOLIC PANEL WITH GFR
AG Ratio: 1.7 (calc) (ref 1.0–2.5)
ALT: 16 U/L (ref 6–29)
AST: 15 U/L (ref 10–35)
Albumin: 4.5 g/dL (ref 3.6–5.1)
Alkaline phosphatase (APISO): 39 U/L (ref 37–153)
BUN: 14 mg/dL (ref 7–25)
CO2: 29 mmol/L (ref 20–32)
Calcium: 10.4 mg/dL (ref 8.6–10.4)
Chloride: 96 mmol/L — ABNORMAL LOW (ref 98–110)
Creat: 0.71 mg/dL (ref 0.60–0.93)
GFR, Est African American: 99 mL/min/{1.73_m2} (ref >=60)
GFR, Est Non African American: 86 mL/min/{1.73_m2} (ref >=60)
Globulin: 2.6 g/dL (calc) (ref 1.9–3.7)
Glucose, Bld: 193 mg/dL — ABNORMAL HIGH (ref 65–99)
Potassium: 3.8 mmol/L (ref 3.5–5.3)
Sodium: 134 mmol/L — ABNORMAL LOW (ref 135–146)
Total Bilirubin: 0.8 mg/dL (ref 0.2–1.2)
Total Protein: 7.1 g/dL (ref 6.1–8.1)

## 2020-04-05 LAB — CBC WITH DIFFERENTIAL/PLATELET
Absolute Monocytes: 869 cells/uL (ref 200–950)
Basophils Absolute: 42 cells/uL (ref 0–200)
Basophils Relative: 0.2 %
Eosinophils Absolute: 0 cells/uL — ABNORMAL LOW (ref 15–500)
Eosinophils Relative: 0 %
HCT: 40.6 % (ref 35.0–45.0)
Hemoglobin: 13.9 g/dL (ref 11.7–15.5)
Lymphs Abs: 1378 cells/uL (ref 850–3900)
MCH: 29.7 pg (ref 27.0–33.0)
MCHC: 34.2 g/dL (ref 32.0–36.0)
MCV: 86.8 fL (ref 80.0–100.0)
MPV: 10.9 fL (ref 7.5–12.5)
Monocytes Relative: 4.1 %
Neutro Abs: 18910 cells/uL — ABNORMAL HIGH (ref 1500–7800)
Neutrophils Relative %: 89.2 %
Platelets: 254 10*3/uL (ref 140–400)
RBC: 4.68 10*6/uL (ref 3.80–5.10)
RDW: 12.3 % (ref 11.0–15.0)
Total Lymphocyte: 6.5 %
WBC: 21.2 10*3/uL — ABNORMAL HIGH (ref 3.8–10.8)

## 2020-04-07 ENCOUNTER — Ambulatory Visit (INDEPENDENT_AMBULATORY_CARE_PROVIDER_SITE_OTHER): Payer: Medicare Other | Admitting: Physician Assistant

## 2020-04-07 ENCOUNTER — Encounter: Payer: Self-pay | Admitting: Physician Assistant

## 2020-04-07 ENCOUNTER — Other Ambulatory Visit: Payer: Self-pay | Admitting: Physician Assistant

## 2020-04-07 ENCOUNTER — Other Ambulatory Visit: Payer: Self-pay

## 2020-04-07 VITALS — BP 124/76 | HR 83 | Temp 97.5°F | Ht 65.0 in | Wt 195.0 lb

## 2020-04-07 DIAGNOSIS — H6981 Other specified disorders of Eustachian tube, right ear: Secondary | ICD-10-CM | POA: Diagnosis not present

## 2020-04-07 DIAGNOSIS — L03113 Cellulitis of right upper limb: Secondary | ICD-10-CM

## 2020-04-07 DIAGNOSIS — C50919 Malignant neoplasm of unspecified site of unspecified female breast: Secondary | ICD-10-CM

## 2020-04-07 DIAGNOSIS — I890 Lymphedema, not elsewhere classified: Secondary | ICD-10-CM

## 2020-04-07 LAB — CBC WITH DIFFERENTIAL/PLATELET
Absolute Monocytes: 703 cells/uL (ref 200–950)
Basophils Absolute: 50 cells/uL (ref 0–200)
Basophils Relative: 0.7 %
Eosinophils Absolute: 192 cells/uL (ref 15–500)
Eosinophils Relative: 2.7 %
HCT: 42.1 % (ref 35.0–45.0)
Hemoglobin: 13.8 g/dL (ref 11.7–15.5)
Lymphs Abs: 1818 cells/uL (ref 850–3900)
MCH: 28.8 pg (ref 27.0–33.0)
MCHC: 32.8 g/dL (ref 32.0–36.0)
MCV: 87.7 fL (ref 80.0–100.0)
MPV: 10.6 fL (ref 7.5–12.5)
Monocytes Relative: 9.9 %
Neutro Abs: 4338 cells/uL (ref 1500–7800)
Neutrophils Relative %: 61.1 %
Platelets: 288 10*3/uL (ref 140–400)
RBC: 4.8 10*6/uL (ref 3.80–5.10)
RDW: 12.4 % (ref 11.0–15.0)
Total Lymphocyte: 25.6 %
WBC: 7.1 10*3/uL (ref 3.8–10.8)

## 2020-04-07 NOTE — Patient Instructions (Signed)
It was great to see you!  Your arm looks much improved! Continue keflex.  We will update your white blood cell counts today to make sure they are improving as well.  Reach out to me if you need me, I'm here Mon - Wed next week.  Take care,  Inda Coke PA-C

## 2020-04-07 NOTE — Progress Notes (Signed)
Amanda Rollins is a 69 y.o. female here for a follow up of a pre-existing problem.   History of Present Illness:   Chief Complaint  Patient presents with   Follow-up    HPI  Cellulitis Patient was seen by my colleague on 04/04/20 for cellulitis. She was started on 500 mg keflex QID x 7 days. CBC at that time was 21.1. Since that time she has had improvement of symptoms. She is taking medication as prescribed without concerning side effects.  Denies: fevers, chills, malaise, pain  ETD Using flonase daily for her ear pressure. Not getting significant relief. Denies: nasal congestion, fever, chills, headaches.    Past Medical History:  Diagnosis Date   Breast cancer ([facility]) 2011   Diabetes mellitus type 2, controlled ([place])    Heart murmur    Hyperlipidemia    Hypertension    Hypothyroidism      Social History   Tobacco Use   Smoking status: Former Smoker    Start date: 02/07/1970   Smokeless tobacco: Never Used  Substance Use Topics   Alcohol use: Yes    Alcohol/week: 3.0 standard drinks    Types: 3 Glasses of wine per week   Drug use: Never    Past Surgical History:  Procedure Laterality Date   ABDOMINAL HYSTERECTOMY  2000   Still has ovaries   BELPHAROPTOSIS REPAIR  07/2019   BREAST SURGERY     CATARACT EXTRACTION  2019   Left eye 2019, Right eye 2020   CESAREAN SECTION  12/08/1990   MASTECTOMY  2011   Bilateral   ROBOTIC ASSISTED LAPAROSCOPIC VAGINAL HYSTERECTOMY WITH FIBROID REMOVAL  1991    Family History  Adopted: Yes  Problem Relation Age of Onset   Cancer Mother        small cell cancer of liver and lungs    Allergies  Allergen Reactions   Sulfa Antibiotics Rash   Metformin Diarrhea    Current Medications:   Current Outpatient Medications:    alendronate (FOSAMAX) 70 MG tablet, TAKE 1 TABLET BY MOUTH ONCE A WEEK, Disp: 12 tablet, Rfl: 3   amLODipine (NORVASC) 5 MG tablet, Take 1 tablet (5 mg total) by mouth  daily., Disp: 90 tablet, Rfl: 3   calcium-vitamin D (OSCAL WITH D) 250-125 MG-UNIT tablet, Take 1 tablet by mouth daily., Disp: , Rfl:    cephALEXin (KEFLEX) 500 MG capsule, Take 1 capsule (500 mg total) by mouth 4 (four) times daily for 7 days., Disp: 28 capsule, Rfl: 0   fluticasone (FLONASE) 50 MCG/ACT nasal spray, SHAKE LIQUID AND USE 2 SPRAYS IN EACH NOSTRIL DAILY, Disp: 16 g, Rfl: 2   levothyroxine (SYNTHROID) 50 MCG tablet, Take 50 mcg by mouth daily., Disp: , Rfl:    losartan-hydrochlorothiazide (HYZAAR) 100-12.5 MG tablet, Take 1 tablet by mouth daily., Disp: 90 tablet, Rfl: 3   magnesium gluconate (MAGONATE) 500 MG tablet, Take 500 mg by mouth 2 (two) times daily., Disp: , Rfl:    rosuvastatin (CRESTOR) 5 MG tablet, Take 1 tablet (5 mg total) by mouth daily., Disp: 90 tablet, Rfl: 3   Review of Systems:   ROS  Negative unless otherwise specified per HPI.  Vitals:   Vitals:   04/07/20 0750  BP: 124/76  Pulse: 83  Temp: (!) 97.5 F (36.4 C)  TempSrc: Temporal  SpO2: 97%  Weight: 195 lb (88.5 kg)  Height: 5\' 5"  (1.651 m)     Body mass index is 32.45 kg/m.  Physical Exam:  Physical Exam Vitals and nursing note reviewed.  Constitutional:      General: She is not in acute distress.    Appearance: She is well-developed. She is not ill-appearing, toxic-appearing or sickly-appearing.  HENT:     Head: Normocephalic and atraumatic.     Right Ear: Tympanic membrane, ear canal and external ear normal. Tympanic membrane is not erythematous, retracted or bulging.     Left Ear: Tympanic membrane, ear canal and external ear normal. Tympanic membrane is not erythematous, retracted or bulging.     Nose: Nose normal.     Right Sinus: No maxillary sinus tenderness or frontal sinus tenderness.     Left Sinus: No maxillary sinus tenderness or frontal sinus tenderness.     Mouth/Throat:     Pharynx: Uvula midline. No posterior oropharyngeal edema or posterior oropharyngeal  erythema.  Eyes:     General: Lids are normal.     Conjunctiva/sclera: Conjunctivae normal.  Neck:     Trachea: Trachea normal.  Cardiovascular:     Rate and Rhythm: Normal rate and regular rhythm.     Pulses: Normal pulses.     Heart sounds: Normal heart sounds, S1 normal and S2 normal.     Comments: No LE edema Pulmonary:     Effort: Pulmonary effort is normal.     Breath sounds: Normal breath sounds. No decreased breath sounds, wheezing, rhonchi or rales.  Lymphadenopathy:     Cervical: No cervical adenopathy.  Skin:    General: Skin is warm, dry and intact.     Comments: R arm with very mild erythema; baseline lymphedema  Significantly improved from last visit  Neurological:     Mental Status: She is alert.     GCS: GCS eye subscore is 4. GCS verbal subscore is 5. GCS motor subscore is 6.  Psychiatric:        Mood and Affect: Mood and affect normal.        Speech: Speech normal.        Behavior: Behavior normal. Behavior is cooperative.       Assessment and Plan:   Amanda Rollins was seen today for follow-up.  Diagnoses and all orders for this visit:  Cellulitis of right upper extremity Improved. Continue keflex as prescribed. Update WBC to ensure trending appropriately. Follow-up as needed. She is aware of worsening precautions. -     CBC with Differential/Platelet; Future -     CBC with Differential/Platelet  Dysfunction of right eustachian tube Trial nasal saline primer, followed by flonase. Discussed doing this BID and following up as needed if no improvement.  CMA or LPN served as scribe during this visit. History, Physical, and Plan performed by medical provider. The above documentation has been reviewed and is accurate and complete.  Inda Coke, PA-C

## 2020-04-09 ENCOUNTER — Encounter: Payer: Self-pay | Admitting: Physician Assistant

## 2020-04-11 ENCOUNTER — Ambulatory Visit: Payer: Medicare Other | Attending: Physician Assistant

## 2020-04-11 ENCOUNTER — Encounter: Payer: Self-pay | Admitting: Physician Assistant

## 2020-04-11 ENCOUNTER — Other Ambulatory Visit: Payer: Self-pay

## 2020-04-11 DIAGNOSIS — R293 Abnormal posture: Secondary | ICD-10-CM | POA: Diagnosis not present

## 2020-04-11 DIAGNOSIS — Z483 Aftercare following surgery for neoplasm: Secondary | ICD-10-CM | POA: Insufficient documentation

## 2020-04-11 DIAGNOSIS — I890 Lymphedema, not elsewhere classified: Secondary | ICD-10-CM | POA: Insufficient documentation

## 2020-04-11 NOTE — Therapy (Signed)
[place], [place], [address] Phone: [phone]   Fax:  [phone]  Physical Therapy Evaluation  Patient Details  Name: Amanda Rollins MRN: [id number] Date of Birth: 08-Mar-1951 Referring Provider (PT): Dr. Morene Rankins   Encounter Date: 04/11/2020   PT End of Session - 04/11/20 1725    Visit Number 1    Number of Visits 19    Date for PT Re-Evaluation 05/23/20    PT Start Time 1003    PT Stop Time [id number]    PT Time Calculation (min) 42 min    Activity Tolerance Patient tolerated treatment well    Behavior During Therapy [facility] for tasks assessed/performed           Past Medical History:  Diagnosis Date   Breast cancer ([place]) 2011   Diabetes mellitus type 2, controlled ([place])    Heart murmur    Hyperlipidemia    Hypertension    Hypothyroidism     Past Surgical History:  Procedure Laterality Date   ABDOMINAL HYSTERECTOMY  2000   Still has ovaries   BELPHAROPTOSIS REPAIR  07/2019   BREAST SURGERY     CATARACT EXTRACTION  2019   Left eye 2019, Right eye 2020   CESAREAN SECTION  12/08/1990   MASTECTOMY  2011   Bilateral   ROBOTIC ASSISTED LAPAROSCOPIC VAGINAL HYSTERECTOMY WITH FIBROID REMOVAL  1991    There were no vitals filed for this visit.    Subjective Assessment - 04/11/20 1008    Subjective Bilateral mastectomies in December  2011 with 20 or so right lymph nodes removed.  [place] in November 2011 and fx right shoulder and developed immediate swelling prior to having breast surgery.  Has been treated 3 different occasions in [place].  Developed cellulitis on Apr 04, 2020 and just finished Keflex .  No longer has pain, and swelling has reduced some.    Pertinent History Pt had bilateral mastectomies with ALND of approx 20 LN in December 2011. Pt. developed cellulitis on Dec 14, and just finished Keflex.   She has  had treatment for lymphedema 3 times in the past at Cancer center  in [place], most recently in 2017.  Pt. wears a  sleeve during the day, and has done some MLD. Right shoulder ROM has been limited since fracturing her humerus in 2011, and left shoulder was hurting some today secondary to a recent fall on stairs.    Currently in Pain? No/denies              [facility] PT Assessment - 04/11/20 0001      Assessment   Medical Diagnosis Right breast Cancer    Referring Provider (PT) Dr. Morene Rankins    Onset Date/Surgical Date --   November 2011   Hand Dominance Right    Prior Therapy yes   3 times in [facility]     Precautions   Precaution Comments risk of infection   just came off antibiotics for cellulitis     Balance Screen   Has the patient fallen in the past 6 months Yes    How many times? 1    Has the patient had a decrease in activity level because of a fear of falling?  Yes    Is the patient reluctant to leave their home because of a fear of falling?  No      Home Social worker Private residence    Living Arrangements Alone  Available Help at Discharge Family;Friend(s)    Type of Home Apartment      Prior Function   Level of Independence Independent    Vocation Retired    Leisure not active      Cognition   Overall Cognitive Status Within Functional Limits for tasks assessed      Observation/Other Assessments   Observations significant swelling throughout right UE.  Improved since cellulitis resolved    Skin Integrity intact      Sensation   Light Touch Appears Intact      Posture/Postural Control   Posture/Postural Control Postural limitations    Postural Limitations Rounded Shoulders;Forward head      AROM   Right/Left Shoulder --   right shoulder limited since fall in 2011, left limited since fall last week   Right Shoulder Extension 43 Degrees    Right Shoulder Flexion 98 Degrees    Right Shoulder ABduction 68 Degrees    Left Shoulder Flexion 147 Degrees    Left Shoulder ABduction 130 Degrees              LYMPHEDEMA/ONCOLOGY QUESTIONNAIRE - 04/11/20 0001      Surgeries   Mastectomy Date --   December 2011   Other Surgery Date --   December 2011   Number Lymph Nodes Removed 20      Treatment   Active Chemotherapy Treatment No    Past Chemotherapy Treatment Yes    Date --   2012   Active Radiation Treatment No    Past Radiation Treatment Yes    Date --   2012   Current Hormone Treatment No    Past Hormone Therapy No      What other symptoms do you have   Are you Having Heaviness or Tightness Yes    Are you having pitting edema Yes    Body Site hand, ulnar border    Is it Hard or Difficult finding clothes that fit Yes    Do you have infections Yes      Lymphedema Stage   Stage STAGE 2 SPONTANEOUSLY IRREVERSIBLE      Right Upper Extremity Lymphedema   At Axilla  40.3 cm    15 cm Proximal to Olecranon Process 36 cm    10 cm Proximal to Olecranon Process 35 cm    Olecranon Process 30 cm    15 cm Proximal to Ulnar Styloid Process 32 cm    10 cm Proximal to Ulnar Styloid Process 27.1 cm    Just Proximal to Ulnar Styloid Process 20.2 cm    Across Hand at [employer] 20.9 cm    At [place] of 2nd Digit 6.7 cm    At [facility] of Thumb 6.4 cm      Left Upper Extremity Lymphedema   At Axilla  35.7 cm    15 cm Proximal to Olecranon Process 32.7 cm    10 cm Proximal to Olecranon Process 29.7 cm    Olecranon Process 25 cm    15 cm Proximal to Ulnar Styloid Process 21.7 cm    10 cm Proximal to Ulnar Styloid Process 18.3 cm    Just Proximal to Ulnar Styloid Process 15.7 cm    Across Hand at [employer] 19.3 cm    At [place] of 2nd Digit 6.5 cm    At [facility] of Thumb 6.2 cm                 [facility]  from 04/11/2020 in [place]  Lymphedema Life Impact Scale Total Score 30.88 %      Objective measurements completed on examination: See above findings.               PT Education - 04/11/20 1100    Education  Details Discussed treatment of lymphedema, and answered pt questions.    Person(s) Educated Patient    Methods Explanation    Comprehension Verbalized understanding               PT Long Term Goals - 04/11/20 1736      PT LONG TERM GOAL #1   Title Pt weill be independent with compression bandaging    Time 3    Period Weeks    Status New    Target Date 05/02/20      PT LONG TERM GOAL #2   Title Pt will be independent with self MLD    Time 4    Period Weeks    Status New    Target Date 05/09/20      PT LONG TERM GOAL #3   Title Pt will have decreased edema at 10 cm prox to ulna and oolecranon by greater than 2.5 cm    Time 6    Period Weeks    Status New    Target Date 05/23/20      PT LONG TERM GOAL #4   Title Pt will be understanding of precautions to prevent exacerbation of lymphedema/cellulits and will know warning signs    Time 6    Period Weeks    Status New    Target Date 05/23/20                  Plan - 04/11/20 1727    Clinical Impression Statement pt is s/p Bilateral Mastectomies with Right ALND in 2011.  She has been treated at a Cancer clinic in [place] 3 times since then.  She recently had cellulitis with increased swelling which has now resolved, and although swelling has reduced some, she could reduce further with rx.  She will benefit from complete decongestive Therapy including bandaging, MLD, Lymphatic exercises, and Education in precautions.  She is wearing a 1 piece sleeve/gauntlet.  She does not have a night time option and does not self bandage. She would benefit from a night time garment and Flexitouch.    Stability/Clinical Decision Making Stable/Uncomplicated    Clinical Decision Making Low    Rehab Potential Good    PT Frequency 3x / week    PT Duration 6 weeks    PT Treatment/Interventions Therapeutic exercise;Patient/family education;Orthotic Fit/Training;Manual techniques;Passive range of motion;Compression bandaging;Manual lymph  drainage;Vasopneumatic Device    PT Next Visit Plan Pt will be away over holidays, returns for a week and is gone again for a week.  Try to teach pt compression bandaging if she will do while away, MLD and instruct pt,    Recommended Other Services Flexitouch?, New sleeve when reduced, Night time garment    Consulted and Agree with Plan of Care Patient           Patient will benefit from skilled therapeutic intervention in order to improve the following deficits and impairments:  Impaired UE functional use,Decreased knowledge of precautions,Increased edema,Postural dysfunction,Decreased range of motion  Visit Diagnosis: Aftercare following surgery for neoplasm  Secondary lymphedema  Abnormal posture     Problem List Patient Active Problem List   Diagnosis Date Noted   Depression, major,  single episode, mild ([place]) 04/05/2020   Osteopenia 02/08/2020   Lymphedema 02/08/2020   Pulmonary nodules 02/08/2020   Atherosclerosis of abdominal aorta ([place]) 02/08/2020   Hypothyroidism    Hypertension    Hyperlipidemia    Heart murmur    Diabetes mellitus type 2, controlled ([place])    Breast cancer [facility]) 2011    Claris Pong 04/11/2020, 5:44 PM  [place], [place], [address] Phone: [phone]   Fax:  [phone]  Name: Amanda Rollins MRN: [id number] Date of Birth: [birth year] Cheral [place]mas, PT 04/11/20 5:45 PM

## 2020-04-24 ENCOUNTER — Ambulatory Visit: Payer: Medicare Other | Attending: Physician Assistant

## 2020-04-24 ENCOUNTER — Other Ambulatory Visit: Payer: Self-pay

## 2020-04-24 DIAGNOSIS — Z483 Aftercare following surgery for neoplasm: Secondary | ICD-10-CM | POA: Insufficient documentation

## 2020-04-24 DIAGNOSIS — R293 Abnormal posture: Secondary | ICD-10-CM | POA: Insufficient documentation

## 2020-04-24 DIAGNOSIS — I890 Lymphedema, not elsewhere classified: Secondary | ICD-10-CM | POA: Diagnosis not present

## 2020-04-24 NOTE — Therapy (Signed)
[facility] [address] [place], [place], [address] Phone: [phone]   Fax:  [phone]  Physical Therapy Treatment  Patient Details  Name: Amanda Rollins MRN: [id number] Date of Birth: 1951-02-22 Referring Provider (PT): Dr. Bufford Buttner   Encounter Date: 04/24/2020   PT End of Session - 04/24/20 0906    Visit Number 2    Number of Visits 19    Date for PT Re-Evaluation 05/23/20    PT Start Time 0759    PT Stop Time 0855    PT Time Calculation (min) 56 min    Activity Tolerance Patient tolerated treatment well    Behavior During Therapy [facility] for tasks assessed/performed           Past Medical History:  Diagnosis Date  . Breast cancer (HCC) 2011  . Diabetes mellitus type 2, controlled (HCC)   . Heart murmur   . Hyperlipidemia   . Hypertension   . Hypothyroidism     Past Surgical History:  Procedure Laterality Date  . ABDOMINAL HYSTERECTOMY  2000   Still has ovaries  . BELPHAROPTOSIS REPAIR  07/2019  . BREAST SURGERY    . CATARACT EXTRACTION  2019   Left eye 2019, Right eye 2020  . CESAREAN SECTION  12/08/1990  . MASTECTOMY  2011   Bilateral  . ROBOTIC ASSISTED LAPAROSCOPIC VAGINAL HYSTERECTOMY WITH FIBROID REMOVAL  1991    There were no vitals filed for this visit.   Subjective Assessment - 04/24/20 0757    Subjective Redness is still gone.  I had to cancel Wednesdays appt  to take a friend for surgery.    Pertinent History Pt had bilateral mastectomies with ALND of approx 20 LN in December 2011. Pt. developed cellulitis on Dec 14, and just finished Keflex.   She has  had treatment for lymphedema 3 times in the past at Cancer center in [place], most recently in 2017.  Pt. wears a  sleeve during the day, and has done some MLD. Right shoulder ROM has been limited since fracturing her humerus in 2011, and left shoulder was hurting some today secondary to a recent fall on stairs.    Currently in Pain? No/denies                      Flowsheet Row Outpatient Rehab from [facility]/11/[facility]  Lymphedema Life Impact Scale Total Score 30.88 %            [facility] Adult PT Treatment/Exercise - 04/24/20 0001      Manual Therapy   Manual Therapy Manual Lymphatic Drainage (MLD);Compression Bandaging    Manual Lymphatic Drainage (MLD) short neck, activated right inguinal and left axillary LN's, 5 diaphragmatic breaths,Anterior interaxillary pathway, right axillo-inguinal pathway, Right lateral arm, medial to lateral and lateral upper arm retracing pathways and to right forearm and hand retracing all pathways.  VC's to pt while performing    Compression Bandaging applied coconut oil, TG soft hand to upper arm.  Wrapped fingers 1-5 with elastomull, artiflex hand to axilla.  6 cm wrap to hand and 2 12 cm wraps hand to axilla with TG soft pulled down over top of bandage. Pt educated in exercises to loosen wraps and shown how to check capillary refill                  PT Education - 04/24/20 0908    Education Details Pt was instructed in compression bandaging as  I was performing and was given written instructions.  She declined practicing herself.  MLD was also verbalized to pt as I was performing.    Person(s) Educated Patient    Methods Explanation    Comprehension Verbalized understanding               PT Long Term Goals - 04/11/20 1736      PT LONG TERM GOAL #1   Title Pt weill be independent with compression bandaging    Time 3    Period Weeks    Status New    Target Date 05/02/20      PT LONG TERM GOAL #2   Title Pt will be independent with self MLD    Time 4    Period Weeks    Status New    Target Date 05/09/20      PT LONG TERM GOAL #3   Title Pt will have decreased edema at 10 cm prox to ulna and oolecranon by greater than 2.5 cm    Time 6    Period Weeks    Status New    Target Date 05/23/20      PT LONG TERM GOAL #4    Title Pt will be understanding of precautions to prevent exacerbation of lymphedema/cellulits and will know warning signs    Time 6    Period Weeks    Status New    Target Date 05/23/20                 Plan - 04/24/20 0909    Clinical Impression Statement therapy consisted of  MLD to Right UE and pathways and compression bandaging to right UE.  Each techniques was verbalized to pt. while performing and discussed easiest way to wrap the arm.  Arm continues with significant swelling from hand to axilla.  Wrapped the easiest way today but may need to add a wrap or foam next visit or 2.    Stability/Clinical Decision Making Stable/Uncomplicated    Clinical Decision Making Low    Rehab Potential Good    PT Frequency 3x / week    PT Duration 6 weeks    PT Treatment/Interventions Therapeutic exercise;Patient/family education;Orthotic Fit/Training;Manual techniques;Passive range of motion;Compression bandaging;Manual lymph drainage;Vasopneumatic Device    PT Next Visit Plan continue MLD and instruct pt, Continue bandaging and instruct pt    Consulted and Agree with Plan of Care Patient           Patient will benefit from skilled therapeutic intervention in order to improve the following deficits and impairments:  Impaired UE functional use,Decreased knowledge of precautions,Increased edema,Postural dysfunction,Decreased range of motion  Visit Diagnosis: Aftercare following surgery for neoplasm  Secondary lymphedema  Abnormal posture     Problem List Patient Active Problem List   Diagnosis Date Noted  . Depression, major, single episode, mild ([place]) 04/05/2020  . Osteopenia 02/08/2020  . Lymphedema 02/08/2020  . Pulmonary nodules 02/08/2020  . Atherosclerosis of abdominal aorta ([place]) 02/08/2020  . Hypothyroidism   . Hypertension   . Hyperlipidemia   . Heart murmur   . Diabetes mellitus type 2, controlled ([place])   . Breast cancer [facility]) 2011    Claris Pong 04/24/2020, 9:13 AM  [place], [place], [address] Phone: [phone]   Fax:  [phone]  Name: Amanda Rollins MRN: [id number] Date of Birth: Mar 01, 1951  Cheral Almas, PT 04/24/20 9:15 AM

## 2020-04-24 NOTE — Patient Instructions (Signed)
Pt was verbally instructed in finger, hand and arm wrapping and was given illustrated and written instructions.  She declined practicing herself, but she has wrapped in the past.

## 2020-04-25 LAB — HM HIV SCREENING LAB: HM HIV Screening: NEGATIVE

## 2020-04-26 ENCOUNTER — Ambulatory Visit: Payer: Medicare Other

## 2020-04-26 ENCOUNTER — Encounter: Payer: Medicare Other | Admitting: Physical Therapy

## 2020-04-26 ENCOUNTER — Other Ambulatory Visit: Payer: Self-pay

## 2020-04-26 DIAGNOSIS — Z483 Aftercare following surgery for neoplasm: Secondary | ICD-10-CM

## 2020-04-26 DIAGNOSIS — R293 Abnormal posture: Secondary | ICD-10-CM

## 2020-04-26 DIAGNOSIS — I890 Lymphedema, not elsewhere classified: Secondary | ICD-10-CM | POA: Diagnosis not present

## 2020-04-26 NOTE — Therapy (Signed)
[facility] [address] [place], [place], [address] Phone: [phone]   Fax:  [phone]  Physical Therapy Treatment  Patient Details  Name: Andreina Outten MRN: [id number] Date of Birth: 04-29-1950 Referring Provider (PT): Dr. Bufford Buttner   Encounter Date: 04/26/2020   PT End of Session - 04/26/20 1735    Visit Number 3    Number of Visits 19    Date for PT Re-Evaluation 05/23/20    PT Start Time 1300    PT Stop Time 1403    PT Time Calculation (min) 63 min    Activity Tolerance Patient tolerated treatment well    Behavior During Therapy [facility] for tasks assessed/performed           Past Medical History:  Diagnosis Date  . Breast cancer (HCC) 2011  . Diabetes mellitus type 2, controlled (HCC)   . Heart murmur   . Hyperlipidemia   . Hypertension   . Hypothyroidism     Past Surgical History:  Procedure Laterality Date  . ABDOMINAL HYSTERECTOMY  2000   Still has ovaries  . BELPHAROPTOSIS REPAIR  07/2019  . BREAST SURGERY    . CATARACT EXTRACTION  2019   Left eye 2019, Right eye 2020  . CESAREAN SECTION  12/08/1990  . MASTECTOMY  2011   Bilateral  . ROBOTIC ASSISTED LAPAROSCOPIC VAGINAL HYSTERECTOMY WITH FIBROID REMOVAL  1991    There were no vitals filed for this visit.   Subjective Assessment - 04/26/20 1257    Subjective Have on the same wrap that we put on Monday.  Finger wrap came off on Tuesday and today. Felt tight the first night and it was hard to sleep, but better last night.    Pertinent History Pt had bilateral mastectomies with ALND of approx 20 LN in December 2011. Pt. developed cellulitis on Dec 14, and just finished Keflex.   She has  had treatment for lymphedema 3 times in the past at Cancer center in [place], most recently in 2017.  Pt. wears a  sleeve during the day, and has done some MLD. Right shoulder ROM has been limited since fracturing her humerus in 2011, and left shoulder was hurting some  today secondary to a recent fall on stairs.    Currently in Pain? No/denies                 LYMPHEDEMA/ONCOLOGY QUESTIONNAIRE - 04/26/20 0001      Right Upper Extremity Lymphedema   10 cm Proximal to Olecranon Process 35 cm (P)     Olecranon Process 28.6 cm (P)     15 cm Proximal to Ulnar Styloid Process 30.6 cm (P)     10 cm Proximal to Ulnar Styloid Process 26.5 cm (P)               Flowsheet Row Outpatient Rehab from 04/11/2020 [facility]  Lymphedema Life Impact Scale Total Score 30.88 %            [facility] Adult PT Treatment/Exercise - 04/26/20 0001      Manual Therapy   Manual Therapy Manual Lymphatic Drainage (MLD);Compression Bandaging    Manual Lymphatic Drainage (MLD) short neck, activated right inguinal and left axillary LN's, 5 diaphragmatic breaths,Anterior interaxillary pathway, right axillo-inguinal pathway, Right lateral arm, medial to lateral and lateral upper arm retracing pathways and to right forearm and hand retracing all pathways.  VC's to pt while performing    Compression Bandaging applied coconut  oil, TG soft hand to upper arm.  Wrapped fingers 1-5 with elastomull, artiflex hand to axilla.  6 cm wrap to hand and 2 12 cm wraps hand to axilla with TG soft pulled down over top of bandage. Pt. was educated in and performed finger wrap x 2 and hand wrap x 1 with mod VC's                  PT Education - 04/26/20 1734    Education Details Pt practiced finger wrap and hand wrap today and required mod VC's/instruction.  She had previously been fgiven illustrated and written instructions    Person(s) Educated Patient    Methods Explanation;Demonstration;Verbal cues    Comprehension Verbalized understanding;Verbal cues required;Need further instruction               PT Long Term Goals - 04/11/20 1736      PT LONG TERM GOAL #1   Title Pt weill be independent with compression bandaging    Time 3     Period Weeks    Status New    Target Date 05/02/20      PT LONG TERM GOAL #2   Title Pt will be independent with self MLD    Time 4    Period Weeks    Status New    Target Date 05/09/20      PT LONG TERM GOAL #3   Title Pt will have decreased edema at 10 cm prox to ulna and oolecranon by greater than 2.5 cm    Time 6    Period Weeks    Status New    Target Date 05/23/20      PT LONG TERM GOAL #4   Title Pt will be understanding of precautions to prevent exacerbation of lymphedema/cellulits and will know warning signs    Time 6    Period Weeks    Status New    Target Date 05/23/20                 Plan - 04/26/20 1735    Clinical Impression Statement Pts wrap had become loose and she cut off the finger wraps.  Arm appeared smaller throughout and was slightly smaller at several measurements taken today.  Pt. practiced finger and hand wrap and does require further review of this.  She will be out of town all week next week but intends to wrap while she is away.    Stability/Clinical Decision Making Stable/Uncomplicated    Clinical Decision Making Low    Rehab Potential Good    PT Frequency 3x / week    PT Duration 6 weeks    PT Treatment/Interventions Therapeutic exercise;Patient/family education;Orthotic Fit/Training;Manual techniques;Passive range of motion;Compression bandaging;Manual lymph drainage;Vasopneumatic Device    PT Next Visit Plan review finger,hand and arm wrap with pt, MLD    Consulted and Agree with Plan of Care Patient           Patient will benefit from skilled therapeutic intervention in order to improve the following deficits and impairments:  Impaired UE functional use,Decreased knowledge of precautions,Increased edema,Postural dysfunction,Decreased range of motion  Visit Diagnosis: Aftercare following surgery for neoplasm  Secondary lymphedema  Abnormal posture     Problem List Patient Active Problem List   Diagnosis Date Noted  .  Depression, major, single episode, mild (HCC) 04/05/2020  . Osteopenia 02/08/2020  . Lymphedema 02/08/2020  . Pulmonary nodules 02/08/2020  . Atherosclerosis of abdominal aorta (HCC) 02/08/2020  . Hypothyroidism   .  Hypertension   . Hyperlipidemia   . Heart murmur   . Diabetes mellitus type 2, controlled (HCC)   . Breast cancer [facility]) 2011    Waynette Buttery 04/26/2020, 5:39 PM  [facility] [address] [place], [place], [address] Phone: [phone]   Fax:  [phone]  Name: Kaitlynn Tramontana MRN: [id number] Date of Birth: 29-May-1950 Alvira Monday, PT 04/26/20 5:40 PM

## 2020-04-28 ENCOUNTER — Ambulatory Visit: Payer: Medicare Other

## 2020-04-28 ENCOUNTER — Other Ambulatory Visit: Payer: Self-pay

## 2020-04-28 DIAGNOSIS — R293 Abnormal posture: Secondary | ICD-10-CM | POA: Diagnosis not present

## 2020-04-28 DIAGNOSIS — I890 Lymphedema, not elsewhere classified: Secondary | ICD-10-CM | POA: Diagnosis not present

## 2020-04-28 DIAGNOSIS — Z483 Aftercare following surgery for neoplasm: Secondary | ICD-10-CM

## 2020-04-28 NOTE — Therapy (Signed)
[place], [place], [address] Phone: [phone]   Fax:  [phone]  Physical Therapy Treatment  Patient Details  Name: Amanda Rollins MRN: [id number] Date of Birth: 1950-06-06 Referring Provider (PT): Dr. Morene Rankins   Encounter Date: 04/28/2020   PT End of Session - 04/28/20 1243    Visit Number 4    Number of Visits 19    Date for PT Re-Evaluation 05/23/20    PT Start Time 1005    PT Stop Time 1055    PT Time Calculation (min) 50 min    Activity Tolerance Patient tolerated treatment well    Behavior During Therapy [facility] for tasks assessed/performed           Past Medical History:  Diagnosis Date  . Breast cancer ([place]) 2011  . Diabetes mellitus type 2, controlled ([place])   . Heart murmur   . Hyperlipidemia   . Hypertension   . Hypothyroidism     Past Surgical History:  Procedure Laterality Date  . ABDOMINAL HYSTERECTOMY  2000   Still has ovaries  . [place]  07/2019  . BREAST SURGERY    . CATARACT EXTRACTION  2019   Left eye 2019, Right eye 2020  . CESAREAN SECTION  12/08/1990  . MASTECTOMY  2011   Bilateral  . ROBOTIC ASSISTED LAPAROSCOPIC VAGINAL HYSTERECTOMY WITH FIBROID REMOVAL  1991    There were no vitals filed for this visit.   Subjective Assessment - 04/28/20 1241    Subjective The finger wrapis so hard to keep on.  It sticks to velcro and I have been trying to care for my friends dogs and its been tough.  I had to cut them off.  This is the wrap you put on Wed.  I didn't have time to redo it.    Pertinent History Pt had bilateral mastectomies with ALND of approx 20 LN in December 2011. Pt. developed cellulitis on Dec 14, and just finished Keflex.   She has  had treatment for lymphedema 3 times in the past at Cancer center in [place], most recently in 2017.  Pt. wears a  sleeve during the day, and has done some MLD. Right shoulder ROM has been limited since fracturing  her humerus in 2011, and left shoulder was hurting some today secondary to a recent fall on stairs.    Currently in Pain? No/denies                     Flowsheet Row Outpatient Rehab from 04/11/2020 [facility]  Lymphedema Life Impact Scale Total Score 30.88 %            [facility] Adult PT Treatment/Exercise - 04/28/20 0001      Manual Therapy   Manual Lymphatic Drainage (MLD) short neck, activated right inguinal and left axillary LN's, 5 diaphragmatic breaths,Anterior interaxillary pathway, right axillo-inguinal pathway, Right lateral arm, medial to lateral and lateral upper arm retracing pathways and to right forearm and hand retracing all pathways.  VC's to pt while performing    Compression Bandaging applied coconut oil, TG soft hand to upper arm.  Wrapped fingers 1-5 with elastomull, artiflex hand to axilla.  6 cm wrap to hand and 2 12 cm wraps hand to axilla with TG soft pulled down over top of bandage.                       PT Long  Term Goals - 04/11/20 1736      PT LONG TERM GOAL #1   Title Pt weill be independent with compression bandaging    Time 3    Period Weeks    Status New    Target Date 05/02/20      PT LONG TERM GOAL #2   Title Pt will be independent with self MLD    Time 4    Period Weeks    Status New    Target Date 05/09/20      PT LONG TERM GOAL #3   Title Pt will have decreased edema at 10 cm prox to ulna and oolecranon by greater than 2.5 cm    Time 6    Period Weeks    Status New    Target Date 05/23/20      PT LONG TERM GOAL #4   Title Pt will be understanding of precautions to prevent exacerbation of lymphedema/cellulits and will know warning signs    Time 6    Period Weeks    Status New    Target Date 05/23/20                 Plan - 04/28/20 1244    Clinical Impression Statement Wraps removed and arm is visibly improved except for fingers where she had cut the wraps off.   She will be out of town now for several days but will try to get a friend to help her wrap while she is in [place]. Pt reminded the wrap must be removed if uncomfortable.  Advised pt to bring her old sleeve with her in case she needs it.   Stability/Clinical Decision Making Stable/Uncomplicated    Rehab Potential Good    PT Frequency 3x / week    PT Duration 6 weeks    PT Treatment/Interventions Therapeutic exercise;Patient/family education;Orthotic Fit/Training;Manual techniques;Passive range of motion;Compression bandaging;Manual lymph drainage;Vasopneumatic Device    PT Next Visit Plan review finger,hand and arm wrap with pt, MLD    Consulted and Agree with Plan of Care Patient           Patient will benefit from skilled therapeutic intervention in order to improve the following deficits and impairments:  Impaired UE functional use,Decreased knowledge of precautions,Increased edema,Postural dysfunction,Decreased range of motion  Visit Diagnosis: Aftercare following surgery for neoplasm  Secondary lymphedema  Abnormal posture     Problem List Patient Active Problem List   Diagnosis Date Noted  . Depression, major, single episode, mild ([place]) 04/05/2020  . Osteopenia 02/08/2020  . Lymphedema 02/08/2020  . Pulmonary nodules 02/08/2020  . Atherosclerosis of abdominal aorta ([place]) 02/08/2020  . Hypothyroidism   . Hypertension   . Hyperlipidemia   . Heart murmur   . Diabetes mellitus type 2, controlled ([place])   . Breast cancer [facility]) 2011    Claris Pong 04/28/2020, 12:46 PM  [place], [place], [address] Phone: [phone]   Fax:  [phone]  Name: Amanda Rollins MRN: [id number] Date of Birth: December 16, 1950 Cheral Almas, PT 04/28/20 12:47 PM

## 2020-05-05 LAB — HEPATITIS B SURFACE ANTIGEN: Hepatitis B Surface Ag: NONREACTIVE

## 2020-05-05 LAB — HIV ANTIBODY (ROUTINE TESTING W REFLEX): HIV 1&2 Ab, 4th Generation: NONREACTIVE

## 2020-05-05 LAB — HM HEPATITIS C SCREENING LAB: HM Hepatitis Screen: NEGATIVE

## 2020-05-11 ENCOUNTER — Telehealth: Payer: Self-pay

## 2020-05-11 NOTE — Telephone Encounter (Signed)
Amy is returning a call from [facility], states they are returning Katie's call for a prior authorization.

## 2020-05-11 NOTE — Telephone Encounter (Signed)
Katie, please see message.

## 2020-05-12 ENCOUNTER — Ambulatory Visit: Payer: Medicare Other

## 2020-05-15 NOTE — Telephone Encounter (Signed)
Scheduled pt for 05/25/2020 at 1:30 pm.

## 2020-05-15 NOTE — Telephone Encounter (Signed)
Please schedule patient for Prolia injection. Approval  # [id number] Member ID # [id number] Jan 24-Jun 15 2020

## 2020-05-16 ENCOUNTER — Other Ambulatory Visit: Payer: Self-pay

## 2020-05-16 ENCOUNTER — Ambulatory Visit: Payer: Medicare Other

## 2020-05-16 DIAGNOSIS — R293 Abnormal posture: Secondary | ICD-10-CM | POA: Diagnosis not present

## 2020-05-16 DIAGNOSIS — I890 Lymphedema, not elsewhere classified: Secondary | ICD-10-CM

## 2020-05-16 DIAGNOSIS — Z483 Aftercare following surgery for neoplasm: Secondary | ICD-10-CM | POA: Diagnosis not present

## 2020-05-16 NOTE — Therapy (Signed)
[place], [place], [address] Phone: [phone]   Fax:  [phone]  Physical Therapy Treatment  Patient Details  Name: Amanda Rollins MRN: [id number] Date of Birth: 25-Dec-1950 Referring Provider (PT): Dr. Morene Rankins   Encounter Date: 05/16/2020   PT End of Session - 05/16/20 1216    Visit Number 5    Number of Visits 19    Date for PT Re-Evaluation 05/23/20    PT Start Time 0958    PT Stop Time 1056    PT Time Calculation (min) 58 min    Activity Tolerance Patient tolerated treatment well    Behavior During Therapy [facility] for tasks assessed/performed           Past Medical History:  Diagnosis Date  . Breast cancer ([place]) 2011  . Diabetes mellitus type 2, controlled ([place])   . Heart murmur   . Hyperlipidemia   . Hypertension   . Hypothyroidism     Past Surgical History:  Procedure Laterality Date  . ABDOMINAL HYSTERECTOMY  2000   Still has ovaries  . [place]  07/2019  . BREAST SURGERY    . CATARACT EXTRACTION  2019   Left eye 2019, Right eye 2020  . CESAREAN SECTION  12/08/1990  . MASTECTOMY  2011   Bilateral  . ROBOTIC ASSISTED LAPAROSCOPIC VAGINAL HYSTERECTOMY WITH FIBROID REMOVAL  1991    There were no vitals filed for this visit.   Subjective Assessment - 05/16/20 0959    Subjective Have been out of town for several weeks.  I tried wrapping but didn't do too well.  We were so busy. Have been wearing my sleeve and gauntlet.  Arm is still swollen, but not as bad as when I first came.    Currently in Pain? No/denies                 LYMPHEDEMA/ONCOLOGY QUESTIONNAIRE - 05/16/20 0001      Right Upper Extremity Lymphedema   At Axilla  40.8 cm (P)     15 cm Proximal to Olecranon Process 36.9 cm (P)     10 cm Proximal to Olecranon Process 33.6 cm (P)     Olecranon Process 30 cm (P)     15 cm Proximal to Ulnar Styloid Process 30.7 cm (P)     10 cm Proximal to  Ulnar Styloid Process 26.7 cm (P)     Just Proximal to Ulnar Styloid Process 20.6 cm (P)     Across Hand at [employer] 21.7 cm (P)     At [place] of 2nd Digit 6.7 cm (P)               Flowsheet Row [facility]patient [facility]  Lymphedema Life Impact Scale Total Score 30.88 %            [facility] Adult PT Treatment/Exercise - 05/16/20 0001      Manual Therapy   Manual Lymphatic Drainage (MLD) short neck, activated right inguinal and left axillary LN's, 5 diaphragmatic breaths,Anterior interaxillary pathway, right axillo-inguinal pathway, Right lateral arm, medial to lateral and lateral upper arm retracing pathways and to right forearm and hand retracing all pathways.  VC's to pt while performing    Compression Bandaging applied coconut oil,  forgot TG soft hand to upper arm.  Wrapped fingers 1-5 with elastomull, artiflex hand to axilla.  6 cm wrap to hand and 2 12 cm wraps hand  to axilla. applied paper tape over fingers to prevent sliding/snagging                  PT Education - 05/16/20 1103    Education Details discussed flexi touch with pt and pt is interested in pursuing if her insurance covers.  We also discussed the need to get her swelling down as much as possible before ordering garments.  She will go out of town again in Feb.    Person(s) Educated Patient    Methods Explanation               PT Long Term Goals - 04/11/20 1736      PT LONG TERM GOAL #1   Title Pt weill be independent with compression bandaging    Time 3    Period Weeks    Status New    Target Date 05/02/20      PT LONG TERM GOAL #2   Title Pt will be independent with self MLD    Time 4    Period Weeks    Status New    Target Date 05/09/20      PT LONG TERM GOAL #3   Title Pt will have decreased edema at 10 cm prox to ulna and oolecranon by greater than 2.5 cm    Time 6    Period Weeks    Status New    Target Date 05/23/20       PT LONG TERM GOAL #4   Title Pt will be understanding of precautions to prevent exacerbation of lymphedema/cellulits and will know warning signs    Time 6    Period Weeks    Status New    Target Date 05/23/20                 Plan - 05/16/20 1217    Clinical Impression Statement P has not been seen in clinic for 2 and a half weeks secondary to being out of town. She did not wrap during that time, but did wear her sleeve.  Her hand is very swollen today and there is some light redness in her forearm likely from the swelling.  We discussed the need to wrap on a daily basis to get best results before ordering garments.    Stability/Clinical Decision Making Stable/Uncomplicated    Rehab Potential Good    PT Frequency 3x / week    PT Duration 6 weeks    PT Treatment/Interventions Therapeutic exercise;Patient/family education;Orthotic Fit/Training;Manual techniques;Passive range of motion;Compression bandaging;Manual lymph drainage;Vasopneumatic Device    PT Next Visit Plan add rosidal foam if available, foam at top of hand, instruct pt is wrapping    Consulted and Agree with Plan of Care Patient           Patient will benefit from skilled therapeutic intervention in order to improve the following deficits and impairments:  Impaired UE functional use,Decreased knowledge of precautions,Increased edema,Postural dysfunction,Decreased range of motion  Visit Diagnosis: Aftercare following surgery for neoplasm  Secondary lymphedema  Abnormal posture     Problem List Patient Active Problem List   Diagnosis Date Noted  . Depression, major, single episode, mild ([place]) 04/05/2020  . Osteopenia 02/08/2020  . Lymphedema 02/08/2020  . Pulmonary nodules 02/08/2020  . Atherosclerosis of abdominal aorta ([place]) 02/08/2020  . Hypothyroidism   . Hypertension   . Hyperlipidemia   . Heart murmur   . Diabetes mellitus type 2, controlled ([place])   . Breast cancer [facility]) 2011  Claris Pong 05/16/2020, 12:20 PM  [place], [place], [address] Phone: [phone]   Fax:  [phone]  Name: Natsha Guidry MRN: [id number] Date of Birth: 15-Feb-1951  Cheral Almas, PT 05/16/20 12:21 PM

## 2020-05-18 ENCOUNTER — Ambulatory Visit: Payer: Medicare Other | Admitting: Physical Therapy

## 2020-05-18 ENCOUNTER — Telehealth: Payer: Self-pay

## 2020-05-18 ENCOUNTER — Encounter: Payer: Self-pay | Admitting: Physical Therapy

## 2020-05-18 ENCOUNTER — Other Ambulatory Visit: Payer: Self-pay

## 2020-05-18 DIAGNOSIS — I890 Lymphedema, not elsewhere classified: Secondary | ICD-10-CM

## 2020-05-18 DIAGNOSIS — Z483 Aftercare following surgery for neoplasm: Secondary | ICD-10-CM | POA: Diagnosis not present

## 2020-05-18 DIAGNOSIS — R293 Abnormal posture: Secondary | ICD-10-CM | POA: Diagnosis not present

## 2020-05-18 NOTE — Therapy (Signed)
[place], [place], [address] Phone: [phone]   Fax:  [phone]  Physical Therapy Treatment  Patient Details  Name: Amanda Rollins MRN: [id number] Date of Birth: 26-Jan-1951 Referring Provider (PT): Dr. Morene Rankins   Encounter Date: 05/18/2020   PT End of Session - 05/18/20 1100    Visit Number 6    Number of Visits 19    Date for PT Re-Evaluation 05/23/20    PT Start Time 1006    PT Stop Time 1059    PT Time Calculation (min) 53 min    Activity Tolerance Patient tolerated treatment well    Behavior During Therapy [facility] for tasks assessed/performed           Past Medical History:  Diagnosis Date  . Breast cancer ([place]) 2011  . Diabetes mellitus type 2, controlled ([place])   . Heart murmur   . Hyperlipidemia   . Hypertension   . Hypothyroidism     Past Surgical History:  Procedure Laterality Date  . ABDOMINAL HYSTERECTOMY  2000   Still has ovaries  . [place]  07/2019  . BREAST SURGERY    . CATARACT EXTRACTION  2019   Left eye 2019, Right eye 2020  . CESAREAN SECTION  12/08/1990  . MASTECTOMY  2011   Bilateral  . ROBOTIC ASSISTED LAPAROSCOPIC VAGINAL HYSTERECTOMY WITH FIBROID REMOVAL  1991    There were no vitals filed for this visit.   Subjective Assessment - 05/18/20 1007    Subjective I took the bandages off last night and took a shower and then I put them back on. It is awkward to do with one hand.I took the bandages off again this morning and put the sleeve on.    Pertinent History Pt had bilateral mastectomies with ALND of approx 20 LN in December 2011. Pt. developed cellulitis on Dec 14, and just finished Keflex.   She has  had treatment for lymphedema 3 times in the past at Cancer center in [place], most recently in 2017.  Pt. wears a  sleeve during the day, and has done some MLD. Right shoulder ROM has been limited since fracturing her humerus in 2011, and left shoulder  was hurting some today secondary to a recent fall on stairs.    Currently in Pain? No/denies                 LYMPHEDEMA/ONCOLOGY QUESTIONNAIRE - 05/18/20 0001      Right Upper Extremity Lymphedema   At Axilla  41 cm    15 cm Proximal to Olecranon Process 36.5 cm    10 cm Proximal to Olecranon Process 34.1 cm    Olecranon Process 29.5 cm    15 cm Proximal to Ulnar Styloid Process 29.3 cm    10 cm Proximal to Ulnar Styloid Process 36.5 cm    Just Proximal to Ulnar Styloid Process 19.4 cm    Across Hand at [employer] 20.6 cm    At [place] of 2nd Digit 6.5 cm              Flowsheet Row [facility]patient Re[facility]  Lymphedema Life Impact Scale Total Score 30.88 %            [facility] Adult PT Treatment/Exercise - 05/18/20 0001      Manual Therapy   Manual Lymphatic Drainage (MLD) short neck, activated right inguinal and left axillary LN's, 5 diaphragmatic breaths,Anterior interaxillary pathway,  right axillo-inguinal pathway, Right lateral arm, medial to lateral and lateral upper arm retracing pathways and to right forearm and hand retracing all pathways.    Compression Bandaging applied lotion, rosidal from wrist to upper arm.  Wrapped fingers 1-5 with elastomull, .  6 cm wrap to hand and 2 12 cm wraps hand to axilla. applied paper tape over fingers to prevent sliding/snagging                       PT Long Term Goals - 04/11/20 1736      PT LONG TERM GOAL #1   Title Pt weill be independent with compression bandaging    Time 3    Period Weeks    Status New    Target Date 05/02/20      PT LONG TERM GOAL #2   Title Pt will be independent with self MLD    Time 4    Period Weeks    Status New    Target Date 05/09/20      PT LONG TERM GOAL #3   Title Pt will have decreased edema at 10 cm prox to ulna and oolecranon by greater than 2.5 cm    Time 6    Period Weeks    Status New    Target Date  05/23/20      PT LONG TERM GOAL #4   Title Pt will be understanding of precautions to prevent exacerbation of lymphedema/cellulits and will know warning signs    Time 6    Period Weeks    Status New    Target Date 05/23/20                 Plan - 05/18/20 1101    Clinical Impression Statement Remeasured circumferences today and pt demonstrates decreases since last session. She was able to leave bandages intact until last night and then she showered and rebandaged herself. Added rosidal to entire arm to continue to decrease swelling. Signed pt up to be measured for a compression sleeve on 2/4. Educated pt to be very consistent with banding so her arm will be reduced as much as possible prior to being measured.    PT Frequency 3x / week    PT Duration 6 weeks    PT Treatment/Interventions Therapeutic exercise;Patient/family education;Orthotic Fit/Training;Manual techniques;Passive range of motion;Compression bandaging;Manual lymph drainage;Vasopneumatic Device    PT Next Visit Plan see how rosidal was - add foam to back of hand    Consulted and Agree with Plan of Care Patient           Patient will benefit from skilled therapeutic intervention in order to improve the following deficits and impairments:  Impaired UE functional use,Decreased knowledge of precautions,Increased edema,Postural dysfunction,Decreased range of motion  Visit Diagnosis: Secondary lymphedema     Problem List Patient Active Problem List   Diagnosis Date Noted  . Depression, major, single episode, mild ([place]) 04/05/2020  . Osteopenia 02/08/2020  . Lymphedema 02/08/2020  . Pulmonary nodules 02/08/2020  . Atherosclerosis of abdominal aorta ([place]) 02/08/2020  . Hypothyroidism   . Hypertension   . Hyperlipidemia   . Heart murmur   . Diabetes mellitus type 2, controlled ([place])   . Breast cancer [facility]) 2011    Allyson Sabal [facility] 05/18/2020, 11:04 AM  [place], [place], [address] Phone: [phone]   Fax:  [phone]  Name: Rogan Ecklund MRN: [id number] Date of Birth:  10/24/1950  Manus Gunning, PT 05/18/20 11:04 AM

## 2020-05-18 NOTE — Telephone Encounter (Signed)
Spoke to Addison asked her to please fax the form over and I will have [place] sign and fax back. Addison verbalized understanding.

## 2020-05-18 NOTE — Telephone Encounter (Signed)
Received fax put in Samantha's box to sign.

## 2020-05-18 NOTE — Telephone Encounter (Signed)
Addison from [employer] is calling in stating they have a form that needs to be filled out for a medical device for patients Lymphedema. Asked if Dr.[place] or her could sign this today if all possible or able to give them a call.

## 2020-05-23 ENCOUNTER — Ambulatory Visit: Payer: Medicare Other | Attending: Physician Assistant

## 2020-05-23 ENCOUNTER — Other Ambulatory Visit: Payer: Self-pay

## 2020-05-23 DIAGNOSIS — R293 Abnormal posture: Secondary | ICD-10-CM | POA: Diagnosis not present

## 2020-05-23 DIAGNOSIS — Z483 Aftercare following surgery for neoplasm: Secondary | ICD-10-CM | POA: Insufficient documentation

## 2020-05-23 DIAGNOSIS — I972 Postmastectomy lymphedema syndrome: Secondary | ICD-10-CM | POA: Diagnosis not present

## 2020-05-23 DIAGNOSIS — I890 Lymphedema, not elsewhere classified: Secondary | ICD-10-CM | POA: Insufficient documentation

## 2020-05-23 NOTE — Therapy (Signed)
[place], [place], [address] Phone: [phone]   Fax:  [phone]  Physical Therapy Treatment  Patient Details  Name: Amanda Rollins MRN: [id number] Date of Birth: 12-14-1950 Referring Provider (PT): Dr. Morene Rankins   Encounter Date: 05/23/2020   PT End of Session - 05/23/20 1007    Visit Number 7    Number of Visits 19    Date for PT Re-Evaluation 07/04/20    PT Start Time 1003    PT Stop Time 1102    PT Time Calculation (min) 59 min    Activity Tolerance Patient tolerated treatment well    Behavior During Therapy [facility] for tasks assessed/performed           Past Medical History:  Diagnosis Date  . Breast cancer ([place]) 2011  . Diabetes mellitus type 2, controlled ([place])   . Heart murmur   . Hyperlipidemia   . Hypertension   . Hypothyroidism     Past Surgical History:  Procedure Laterality Date  . ABDOMINAL HYSTERECTOMY  2000   Still has ovaries  . [place]  07/2019  . BREAST SURGERY    . CATARACT EXTRACTION  2019   Left eye 2019, Right eye 2020  . CESAREAN SECTION  12/08/1990  . MASTECTOMY  2011   Bilateral  . ROBOTIC ASSISTED LAPAROSCOPIC VAGINAL HYSTERECTOMY WITH FIBROID REMOVAL  1991    There were no vitals filed for this visit.   Subjective Assessment - 05/23/20 1003    Subjective They set up the Flexi touch for me yesterday.  I loved it. It felt really good.  I hope insurance approves it.  Had the wrap on through yesterday am. Addison told me to put the sleeve back on so I have kept it on until this am.  Feel like my arm is doing better    Pertinent History Pt had bilateral mastectomies with ALND of approx 20 LN in December 2011. Pt. developed cellulitis on Dec 14, and just finished Keflex.   She has  had treatment for lymphedema 3 times in the past at Cancer center in [place], most recently in 2017.  Pt. wears a  sleeve during the day, and has done some MLD. Right  shoulder ROM has been limited since fracturing her humerus in 2011, and left shoulder was hurting some today secondary to a recent fall on stairs.                 LYMPHEDEMA/ONCOLOGY QUESTIONNAIRE - 05/23/20 0001      Surgeries   Mastectomy Date --   December 2011   Number Lymph Nodes Removed 20      What other symptoms do you have   Do you have infections Yes    Comments has had 2 cellulitis infections      Lymphedema Stage   Stage STAGE 2 SPONTANEOUSLY IRREVERSIBLE      Right Upper Extremity Lymphedema   At Axilla  39.7 cm    15 cm Proximal to Olecranon Process 37.3 cm    10 cm Proximal to Olecranon Process 33.3 cm    Olecranon Process 29 cm    15 cm Proximal to Ulnar Styloid Process 30 cm    10 cm Proximal to Ulnar Styloid Process 25 cm    Just Proximal to Ulnar Styloid Process 19.1 cm    Across Hand at [employer] 20.5 cm    At [place] of 2nd Digit 6.7 cm  Flowsheet Row Outpatient Rehab from 04/11/2020 in [place]  Lymphedema Life Impact Scale Total Score 30.88 %            [facility] Adult PT Treatment/Exercise - 05/23/20 0001      Manual Therapy   Edema Management chip pack made for dorsum of hand    Manual Lymphatic Drainage (MLD) short neck, activated right inguinal and left axillary LN's, 5 diaphragmatic breaths,Anterior interaxillary pathway, right axillo-inguinal pathway, Right lateral arm, medial to lateral and lateral upper arm retracing pathways and to right forearm and hand retracing all pathways.    Compression Bandaging applied coconut oil, TG soft hand to upper arm.  Wrapped fingers 1-5 with elastomull, Rosidal hand to axilla.  6 cm wrap to hand over chip pack made to decrease swelling and 2 12 cm wraps hand to axilla with TG soft pulled down over top of bandage.                       PT Long Term Goals - 05/23/20 1130      PT LONG TERM GOAL #1   Title Pt weill be independent  with compression bandaging    Baseline is wrapping, but has some difficulty still    Time 3    Period Weeks    Status On-going    Target Date 06/13/20      PT LONG TERM GOAL #2   Title Pt will be independent with self MLD    Period Weeks    Status On-going    Target Date 06/20/20      PT LONG TERM GOAL #3   Title Pt will have decreased edema at 10 cm prox to ulna and oolecranon by greater than 2.5 cm    Baseline 2 cm today    Time 6    Period Weeks    Status On-going    Target Date 07/04/20      PT LONG TERM GOAL #4   Title Pt will be understanding of precautions to prevent exacerbation of lymphedema/cellulits and will know warning signs    Time 3    Period Weeks    Status On-going      PT LONG TERM GOAL #5   Title Pt will be fit for new compression garment for improved edema management    Time 6    Period Weeks    Status New    Target Date 07/04/20                 Plan - 05/23/20 1117    Clinical Impression Statement Pt. has had limited visits over 6 weeks secondary to being out of town. Pt was remeasured today with approx 2 cm decrease in circumference at forearm and upper arm but with continued ability to further reduce.  Chip pack was made for dorsum of hand and applied during wrapping by PT.  Pt did Flexi touch trial yesterday and felt it was very beneficial.  Pt has had over 4 weeks of conservative  home therapies  including compression, elevation and exercise and is also noted to have areas of hyperplasia and trunkal edema remaining.  She would benefit from a Flexi touch to further reduce her right UE and to allow her to be independent in her self management upon release from PT.  This is imperative as Lymphedema requires lifelong management as it has no known cure.    Stability/Clinical Decision Making Stable/Uncomplicated    Rehab Potential  Good    PT Frequency 3x / week    PT Duration 6 weeks    PT Treatment/Interventions Therapeutic exercise;Patient/family  education;Orthotic Fit/Training;Manual techniques;Passive range of motion;Compression bandaging;Manual lymph drainage;Vasopneumatic Device    PT Next Visit Plan assess benefit of chip pack, determine if ready for measuring Friday, review self MLD    Consulted and Agree with Plan of Care Patient           Patient will benefit from skilled therapeutic intervention in order to improve the following deficits and impairments:  Impaired UE functional use,Decreased knowledge of precautions,Increased edema,Postural dysfunction,Decreased range of motion  Visit Diagnosis: Secondary lymphedema  Postmastectomy lymphedema syndrome  Aftercare following surgery for neoplasm  Abnormal posture     Problem List Patient Active Problem List   Diagnosis Date Noted  . Depression, major, single episode, mild ([place]) 04/05/2020  . Osteopenia 02/08/2020  . Lymphedema 02/08/2020  . Pulmonary nodules 02/08/2020  . Atherosclerosis of abdominal aorta ([place]) 02/08/2020  . Hypothyroidism   . Hypertension   . Hyperlipidemia   . Heart murmur   . Diabetes mellitus type 2, controlled ([place])   . Breast cancer [facility]) 2011    Claris Pong 05/23/2020, 11:34 AM  [place], [place], [address] Phone: [phone]   Fax:  [phone]  Name: Amanda Rollins MRN: [id number] Date of Birth: 05/11/1950 Cheral Almas, PT 05/23/20 11:36 AM

## 2020-05-24 ENCOUNTER — Ambulatory Visit: Payer: Medicare Other

## 2020-05-25 ENCOUNTER — Ambulatory Visit (INDEPENDENT_AMBULATORY_CARE_PROVIDER_SITE_OTHER): Payer: Medicare Other

## 2020-05-25 ENCOUNTER — Other Ambulatory Visit: Payer: Self-pay

## 2020-05-25 ENCOUNTER — Ambulatory Visit: Payer: Medicare Other

## 2020-05-25 DIAGNOSIS — R293 Abnormal posture: Secondary | ICD-10-CM

## 2020-05-25 DIAGNOSIS — Z483 Aftercare following surgery for neoplasm: Secondary | ICD-10-CM | POA: Diagnosis not present

## 2020-05-25 DIAGNOSIS — I972 Postmastectomy lymphedema syndrome: Secondary | ICD-10-CM

## 2020-05-25 DIAGNOSIS — I890 Lymphedema, not elsewhere classified: Secondary | ICD-10-CM

## 2020-05-25 DIAGNOSIS — M810 Age-related osteoporosis without current pathological fracture: Secondary | ICD-10-CM | POA: Diagnosis not present

## 2020-05-25 MED ORDER — DENOSUMAB 60 MG/ML ~~LOC~~ SOSY
60.0000 mg | PREFILLED_SYRINGE | Freq: Once | SUBCUTANEOUS | Status: AC
  Administered 2020-05-25: 60 mg via SUBCUTANEOUS

## 2020-05-25 NOTE — Therapy (Signed)
[place], [place], [address] Phone: [phone]   Fax:  [phone]  Physical Therapy Treatment  Patient Details  Name: Amanda Rollins MRN: [id number] Date of Birth: 22-May-1950 Referring Provider (PT): Dr. Morene Rankins   Encounter Date: 05/25/2020   PT End of Session - 05/25/20 1110    Visit Number 8    Number of Visits 19    Date for PT Re-Evaluation 07/04/20    PT Start Time 1003    PT Stop Time 1059    PT Time Calculation (min) 56 min    Activity Tolerance Patient tolerated treatment well    Behavior During Therapy [facility] for tasks assessed/performed           Past Medical History:  Diagnosis Date  . Breast cancer ([place]) 2011  . Diabetes mellitus type 2, controlled ([place])   . Heart murmur   . Hyperlipidemia   . Hypertension   . Hypothyroidism     Past Surgical History:  Procedure Laterality Date  . ABDOMINAL HYSTERECTOMY  2000   Still has ovaries  . [place]  07/2019  . BREAST SURGERY    . CATARACT EXTRACTION  2019   Left eye 2019, Right eye 2020  . CESAREAN SECTION  12/08/1990  . MASTECTOMY  2011   Bilateral  . ROBOTIC ASSISTED LAPAROSCOPIC VAGINAL HYSTERECTOMY WITH FIBROID REMOVAL  1991    There were no vitals filed for this visit.   Subjective Assessment - 05/25/20 1105    Subjective My arm is doing better.  It all looks reduced to me.  the chip pack did seem to really help the back of my hand and made lots of impressions on it.  I took the wrap off this am so I could roll the bandages and put on my sleeve.    Pertinent History Pt had bilateral mastectomies with ALND of approx 20 LN in December 2011. Pt. developed cellulitis on Dec 14, and just finished Keflex.   She has  had treatment for lymphedema 3 times in the past at Cancer center in [place], most recently in 2017.  Pt. wears a  sleeve during the day, and has done some MLD. Right shoulder ROM has been limited since  fracturing her humerus in 2011, and left shoulder was hurting some today secondary to a recent fall on stairs.    Currently in Pain? No/denies                     Flowsheet Row Outpatient Re[facility]  Lymphedema Life Impact Scale Total Score 30.88 %            [facility] Adult PT Treatment/Exercise - 05/25/20 0001      Manual Therapy   Manual Lymphatic Drainage (MLD) short neck, activated right inguinal and left axillary LN's, 5 diaphragmatic breaths,Anterior interaxillary pathway, right axillo-inguinal pathway, Right lateral arm, medial to lateral and lateral upper arm retracing pathways and to right forearm and hand retracing all pathways.    Compression Bandaging applied coconut oil, TG soft hand to upper arm.  Wrapped fingers 1-5 with elastomull, Rosidal hand to axilla.  6 cm wrap to hand over chip pack made to decrease swelling and 2 12 cm wraps hand to axilla with TG soft pulled down over top of bandage.                  PT Education - 05/25/20 1108  Education Details Discussed flat knit sleeve/glove and tribute for night time.  Pt will be measured tomorrow and will order after she knows the cost to her.  Will also try the pink custom sleeve in our office for fit.    Person(s) Educated Patient    Methods Explanation    Comprehension Verbalized understanding               PT Long Term Goals - 05/23/20 1130      PT LONG TERM GOAL #1   Title Pt weill be independent with compression bandaging    Baseline is wrapping, but has some difficulty still    Time 3    Period Weeks    Status On-going    Target Date 06/13/20      PT LONG TERM GOAL #2   Title Pt will be independent with self MLD    Period Weeks    Status On-going    Target Date 06/20/20      PT LONG TERM GOAL #3   Title Pt will have decreased edema at 10 cm prox to ulna and oolecranon by greater than 2.5 cm    Baseline 2 cm today     Time 6    Period Weeks    Status On-going    Target Date 07/04/20      PT LONG TERM GOAL #4   Title Pt will be understanding of precautions to prevent exacerbation of lymphedema/cellulits and will know warning signs    Time 3    Period Weeks    Status On-going      PT LONG TERM GOAL #5   Title Pt will be fit for new compression garment for improved edema management    Time 6    Period Weeks    Status New    Target Date 07/04/20                 Plan - 05/25/20 1110    Stability/Clinical Decision Making Stable/Uncomplicated    Rehab Potential Good    PT Frequency 3x / week    PT Duration 6 weeks    PT Treatment/Interventions Therapeutic exercise;Patient/family education;Orthotic Fit/Training;Manual techniques;Passive range of motion;Compression bandaging;Manual lymph drainage;Vasopneumatic Device    PT Next Visit Plan being measured tomorrow, try pink sleeve from office, continue MLD, Bandaging until garments arrive.    Consulted and Agree with Plan of Care Patient           Patient will benefit from skilled therapeutic intervention in order to improve the following deficits and impairments:  Impaired UE functional use,Decreased knowledge of precautions,Increased edema,Postural dysfunction,Decreased range of motion  Visit Diagnosis: Postmastectomy lymphedema syndrome  Secondary lymphedema  Aftercare following surgery for neoplasm  Abnormal posture     Problem List Patient Active Problem List   Diagnosis Date Noted  . Depression, major, single episode, mild ([place]) 04/05/2020  . Osteopenia 02/08/2020  . Lymphedema 02/08/2020  . Pulmonary nodules 02/08/2020  . Atherosclerosis of abdominal aorta ([place]) 02/08/2020  . Hypothyroidism   . Hypertension   . Hyperlipidemia   . Heart murmur   . Diabetes mellitus type 2, controlled ([place])   . Breast cancer [facility]) 2011    Claris Pong 05/25/2020, [place], [place], [address] Phone: [phone]   Fax:  [phone]  Name: Amanda Rollins MRN: [id number] Date of Birth: Aug 13, 1950 Cheral Almas, PT 05/25/20 11:15 AM

## 2020-05-25 NOTE — Progress Notes (Addendum)
Amanda Rollins 69 yr old female presents to office today for her first Prolia injection per Inda Coke, [place]. Administered DENOSUMAB 60 mg/mL subcutaneous left arm. Patient tolerated well.   Agree with the above information.  Inda Coke PA-C

## 2020-05-26 ENCOUNTER — Ambulatory Visit (INDEPENDENT_AMBULATORY_CARE_PROVIDER_SITE_OTHER): Payer: Medicare Other

## 2020-05-26 VITALS — BP 110/68 | HR 97 | Temp 98.2°F | Wt 194.8 lb

## 2020-05-26 DIAGNOSIS — I972 Postmastectomy lymphedema syndrome: Secondary | ICD-10-CM | POA: Diagnosis not present

## 2020-05-26 DIAGNOSIS — Z0000 Encounter for general adult medical examination without abnormal findings: Secondary | ICD-10-CM

## 2020-05-26 NOTE — Progress Notes (Addendum)
Subjective:   Amanda Rollins is a 70 y.o. female who presents for an Initial Medicare Annual Wellness Visit.  Review of Systems     Cardiac Risk Factors include: advanced age (>94men, >57 women);diabetes mellitus;dyslipidemia;hypertension;obesity (BMI >30kg/m2)     Objective:    Today's Vitals   05/26/20 1305 05/26/20 1309  BP: 110/68   Pulse: 97   Temp: 98.2 F (36.8 C)   SpO2: 97%   Weight: 194 lb 12.8 oz (88.4 kg)   PainSc:  1    Body mass index is 32.42 kg/m.  Advanced Directives 05/26/2020 04/11/2020  Does Patient Have a Medical Advance Directive? Yes Yes  Type of Paramedic of [place];Living will;Out of facility DNR (pink MOST or yellow form)  Copy of [place] in Chart? No - copy requested -    Current Medications (verified) Outpatient Encounter Medications as of 05/26/2020  Medication Sig  . amLODipine (NORVASC) 5 MG tablet Take 1 tablet (5 mg total) by mouth daily.  . calcium-vitamin D (OSCAL WITH D) 250-125 MG-UNIT tablet Take 1 tablet by mouth daily.  Marland Kitchen denosumab (PROLIA) 60 MG/ML SOSY injection Inject 60 mg into the skin every 6 (six) months.  . fluticasone (FLONASE) 50 MCG/ACT nasal spray SHAKE LIQUID AND USE 2 SPRAYS IN EACH NOSTRIL DAILY  . levothyroxine (SYNTHROID) 50 MCG tablet Take 50 mcg by mouth daily.  Marland Kitchen losartan-hydrochlorothiazide (HYZAAR) 100-12.5 MG tablet Take 1 tablet by mouth daily.  . magnesium gluconate (MAGONATE) 500 MG tablet Take 500 mg by mouth 2 (two) times daily.  . rosuvastatin (CRESTOR) 5 MG tablet Take 1 tablet (5 mg total) by mouth daily.  . Semaglutide (RYBELSUS PO) Take by mouth.  . [DISCONTINUED] alendronate (FOSAMAX) 70 MG tablet TAKE 1 TABLET BY MOUTH ONCE A WEEK (Patient not taking: Reported on 05/26/2020)   No facility-administered encounter medications on file as of 05/26/2020.    Allergies (verified) Sulfa antibiotics and Metformin   History: Past  Medical History:  Diagnosis Date  . Breast cancer ([place]) 2011  . Diabetes mellitus type 2, controlled ([place])   . Heart murmur   . Hyperlipidemia   . Hypertension   . Hypothyroidism    Past Surgical History:  Procedure Laterality Date  . ABDOMINAL HYSTERECTOMY  2000   Still has ovaries  . [place]  07/2019  . BREAST SURGERY    . CATARACT EXTRACTION  2019   Left eye 2019, Right eye 2020  . CESAREAN SECTION  12/08/1990  . MASTECTOMY  2011   Bilateral  . ROBOTIC ASSISTED LAPAROSCOPIC VAGINAL HYSTERECTOMY WITH FIBROID REMOVAL  1991   Family History  Adopted: Yes  Problem Relation Age of Onset  . Cancer Mother        small cell cancer of liver and lungs   Social History   Socioeconomic History  . Marital status: Widowed    Spouse name: Not on file  . Number of children: Not on file  . Years of education: Not on file  . Highest education level: Not on file  Occupational History  . Occupation: Retired   Tobacco Use  . Smoking status: Former Smoker    Start date: 02/07/1970  . Smokeless tobacco: Never Used  Substance and Sexual Activity  . Alcohol use: Yes    Alcohol/week: 3.0 standard drinks    Types: 3 Glasses of wine per week  . Drug use: Never  . Sexual activity: Not on file  Other Topics Concern  . Not on file  Social History Narrative   Widow   Moved from [place] in about    Dentist -- former   Social Determinants of Radio broadcast assistant Strain: Low Risk   . Difficulty of Paying Living Expenses: Not hard at all  Food Insecurity: No Food Insecurity  . Worried About Charity fundraiser in the Last Year: Never true  . Ran Out of Food in the Last Year: Never true  Transportation Needs: No Transportation Needs  . Lack of Transportation (Medical): No  . Lack of Transportation (Non-Medical): No  Physical Activity: Inactive  . Days of Exercise per Week: 0 days  . Minutes of Exercise per Session: 0 min  Stress: No Stress Concern Present   . Feeling of Stress : Not at all  Social Connections: Moderately Integrated  . Frequency of Communication with Friends and Family: Twice a week  . Frequency of Social Gatherings with Friends and Family: Once a week  . Attends Religious Services: More than 4 times per year  . Active Member of Clubs or Organizations: Yes  . Attends Archivist Meetings: 1 to 4 times per year  . Marital Status: Widowed    Tobacco Counseling Counseling given: Not Answered   Clinical Intake:  Pre-visit preparation completed: Yes  Pain : 0-10 Pain Score: 1  Pain Location: Shoulder Pain Orientation: Left Pain Descriptors / Indicators: Stabbing Pain Frequency: Intermittent     BMI - recorded: 32.42 Nutritional Status: BMI > 30  Obese Nutritional Risks: None Diabetes: Yes CBG done?: No Did pt. bring in CBG monitor from home?: No  How often do you need to have someone help you when you read instructions, pamphlets, or other written materials from your doctor or pharmacy?: 1 - Never  Diabetic?Nutrition Risk Assessment:  Has the patient had any N/V/D within the last 2 months?  No  Does the patient have any non-healing wounds?  No  Has the patient had any unintentional weight loss or weight gain?  No   Diabetes:  Is the patient diabetic?  Yes  If diabetic, was a CBG obtained today?  No  Did the patient bring in their glucometer from home?  No  How often do you monitor your CBG's? N/A.   Financial Strains and Diabetes Management:  Are you having any financial strains with the device, your supplies or your medication? No .  Does the patient want to be seen by Chronic Care Management for management of their diabetes?  No  Would the patient like to be referred to a Nutritionist or for Diabetic Management?  No   Diabetic Exams:  Diabetic Eye Exam: Overdue for diabetic eye exam. Pt has been advised about the importance in completing this exam. Patient advised to call and schedule an  eye exam. Diabetic Foot Exam: Overdue, Pt has been advised about the importance in completing this exam. Pt is scheduled for diabetic foot exam on 08/15/20 appt with Dr.   Caryl Asp Needed?: No  Information entered by :: Charlott Rakes, LPN   Activities of Daily Living In your present state of health, do you have any difficulty performing the following activities: 05/26/2020 02/08/2020  Hearing? Y Y  Comment mild loss Pt says that her hearing is not what it used to be.  Vision? N N  Difficulty concentrating or making decisions? N N  Walking or climbing stairs? N N  Dressing or bathing? N N  Doing errands,  shopping? N N  Preparing Food and eating ? N -  Using the Toilet? N -  Managing your Medications? N -  Managing your Finances? N -  Housekeeping or managing your Housekeeping? N -  Some recent data might be hidden    Patient Care Team: Inda Coke, [place] as PCP - General (Physician Assistant)  Indicate any recent Medical Services you may have received from other than [facility] providers in the past year (date may be approximate).     Assessment:   This is a routine wellness examination for Sanyiah.  Hearing/Vision screen  Hearing Screening   125Hz  250Hz  500Hz  1000Hz  2000Hz  3000Hz  4000Hz  6000Hz  8000Hz   Right ear:           Left ear:           Comments: Mild loss   Vision Screening Comments: Pt has just moved and will need to find provider  Dietary issues and exercise activities discussed: Current Exercise Habits: The patient does not participate in regular exercise at present  Goals    . Patient Stated     Lose weight       Depression Screen PHQ 2/9 Scores 05/26/2020 02/08/2020  PHQ - 2 Score 0 0    Fall Risk Fall Risk  05/26/2020 02/08/2020  Falls in the past year? 1 0  Number falls in past yr: 1 -  Injury with Fall? 0 -  Risk for fall due to : - No Fall Risks  Follow up Falls prevention discussed -    [place]:  Any  stairs in or around the home? Yes  If so, are there any without handrails? No  Home free of loose throw rugs in walkways, pet beds, electrical cords, etc? Yes  Adequate lighting in your home to reduce risk of falls? Yes   ASSISTIVE DEVICES UTILIZED TO PREVENT FALLS:  Life alert? No  Use of a cane, walker or w/c? No  Grab bars in the bathroom? No  Shower chair or bench in shower? No  Elevated toilet seat or a handicapped toilet? No   TIMED UP AND GO:  Was the test performed? Yes .  Length of time to ambulate 10 feet: 15 sec.   Gait steady and fast without use of assistive device  Cognitive Function:     6CIT Screen 05/26/2020  What Year? 0 points  What month? 0 points  Count back from 20 0 points  Months in reverse 0 points  Repeat phrase 2 points    Immunizations Immunization History  Administered Date(s) Administered  . Fluad Quad(high Dose 65+) 04/07/2019, 03/27/2020  . Influenza Split 02/05/2012  . Moderna Sars-Covid-2 Vaccination 06/25/2019, 07/23/2019, 04/11/2020  . Pneumococcal Conjugate-13 12/10/2017  . Pneumococcal Polysaccharide-23 03/24/2019  . Td 03/02/2010    TDAP status: Due, Education has been provided regarding the importance of this vaccine. Advised may receive this vaccine at local pharmacy or Health Dept. Aware to provide a copy of the vaccination record if obtained from local pharmacy or Health Dept. Verbalized acceptance and understanding.  Flu Vaccine status: Up to date  Done 03/27/20 Pneumococcal vaccine status: Up to date  Covid-19 vaccine status: Completed vaccines  Qualifies for Shingles Vaccine? Yes   Zostavax completed No   Shingrix Completed?: No.    Education has been provided regarding the importance of this vaccine. Patient has been advised to call insurance company to determine out of pocket expense if they have not yet received this vaccine.  Advised may also receive vaccine at local pharmacy or Health Dept. Verbalized acceptance and  understanding.  Screening Tests Health Maintenance  Topic Date Due  . Hepatitis C Screening  Never done  . FOOT EXAM  07/21/2020 (Originally 09/06/1960)  . OPHTHALMOLOGY EXAM  07/21/2020 (Originally 09/06/1960)  . COLONOSCOPY (Pts 45-6yrs Insurance coverage will need to be confirmed)  05/26/2021 (Originally 09/05/1993)  . TETANUS/TDAP  05/26/2021 (Originally 03/02/2020)  . HEMOGLOBIN A1C  09/25/2020  . COVID-19 Vaccine (4 - Booster for Moderna series) 10/10/2020  . INFLUENZA VACCINE  Completed  . DEXA SCAN  Completed  . PNA vac Low Risk Adult  Completed  . MAMMOGRAM  Discontinued    Health Maintenance  Health Maintenance Due  Topic Date Due  . Hepatitis C Screening  Never done    Colorectal cancer screening: No longer required.   Mammogram status: No longer required due to pt .  Bone Density status: Completed 04/03/20. Results reflect: Bone density results: OSTEOPENIA. Repeat every 2 years.    Additional Screening:  Hepatitis C Screening: does qualify  Vision Screening: Recommended annual ophthalmology exams for early detection of glaucoma and other disorders of the eye. Is the patient up to date with their annual eye exam?  No  Who is the provider or what is the name of the office in which the patient attends annual eye exams? Needs to find provider If pt is not established with a provider, would they like to be referred to a provider to establish care? Yes .   Dental Screening: Recommended annual dental exams for proper oral hygiene  Community Resource Referral / Chronic Care Management: CRR required this visit?  No   CCM required this visit?  No      Plan:     I have personally reviewed and noted the following in the patient's chart:   . Medical and social history . Use of alcohol, tobacco or illicit drugs  . Current medications and supplements . Functional ability and status . Nutritional status . Physical activity . Advanced directives . List of other  physicians . Hospitalizations, surgeries, and ER visits in previous 12 months . Vitals . Screenings to include cognitive, depression, and falls . Referrals and appointments  In addition, I have reviewed and discussed with patient certain preventive protocols, quality metrics, and best practice recommendations. A written personalized care plan for preventive services as well as general preventive health recommendations were provided to patient.     Willette Brace, LPN   [id number]   Nurse Notes: None  I have reviewed documentation for AWV and Advance Care planning provided by [facility], I agree with documentation, I was immediately available for any questions. Inda Coke, [place]

## 2020-05-26 NOTE — Patient Instructions (Addendum)
Amanda Rollins , Thank you for taking time to come for your Medicare Wellness Visit. I appreciate your ongoing commitment to your health goals. Please review the following plan we discussed and let me know if I can assist you in the future.   Screening recommendations/referrals: Colonoscopy: Declined  Mammogram: No longer required Bone Density: Done 04/03/20 Recommended yearly ophthalmology/optometry visit for glaucoma screening and checkup Recommended yearly dental visit for hygiene and checkup  Vaccinations: Influenza vaccine: Up to date Pneumococcal vaccine: Up to date Tdap vaccine: Due and discussed Shingles vaccine: Shingrix discussed. Please contact your pharmacy for coverage information.    Covid-19:Completed 06/25/19, 07/23/19, & 04/11/20  Advanced directives: Please bring a copy of your health care power of attorney and living will to the office at your convenience.  Conditions/risks identified: Lose weight   Next appointment: Follow up in one year for your annual wellness visit    Preventive Care 72 Years and Older, Female Preventive care refers to lifestyle choices and visits with your health care provider that can promote health and wellness. What does preventive care include?  A yearly physical exam. This is also called an annual well check.  Dental exams once or twice a year.  Routine eye exams. Ask your health care provider how often you should have your eyes checked.  Personal lifestyle choices, including:  Daily care of your teeth and gums.  Regular physical activity.  Eating a healthy diet.  Avoiding tobacco and drug use.  Limiting alcohol use.  Practicing safe sex.  Taking low-dose aspirin every day.  Taking vitamin and mineral supplements as recommended by your health care provider. What happens during an annual well check? The services and screenings done by your health care provider during your annual well check will depend on your age, overall health,  lifestyle risk factors, and family history of disease. Counseling  Your health care provider may ask you questions about your:  Alcohol use.  Tobacco use.  Drug use.  Emotional well-being.  Home and relationship well-being.  Sexual activity.  Eating habits.  History of falls.  Memory and ability to understand (cognition).  Work and work Statistician.  Reproductive health. Screening  You may have the following tests or measurements:  Height, weight, and BMI.  Blood pressure.  Lipid and cholesterol levels. These may be checked every 5 years, or more frequently if you are over 60 years old.  Skin check.  Lung cancer screening. You may have this screening every year starting at age 72 if you have a 30-pack-year history of smoking and currently smoke or have quit within the past 15 years.  Fecal occult blood test (FOBT) of the stool. You may have this test every year starting at age 72.  Flexible sigmoidoscopy or colonoscopy. You may have a sigmoidoscopy every 5 years or a colonoscopy every 10 years starting at age 72.  Hepatitis C blood test.  Hepatitis B blood test.  Sexually transmitted disease (STD) testing.  Diabetes screening. This is done by checking your blood sugar (glucose) after you have not eaten for a while (fasting). You may have this done every 1-3 years.  Bone density scan. This is done to screen for osteoporosis. You may have this done starting at age 72.  Mammogram. This may be done every 1-2 years. Talk with your health care provider about how often you should have regular mammograms. Talk with your health care provider about how often you should have regular mammograms.  Talk to your health care provider about how often you should have regular mammograms. Talk with your health care provider about your test results, treatment options, and if necessary, the need for more tests.  Vaccines  Your health care provider may recommend certain vaccines, such as:  Influenza vaccine. This is recommended every year.  Tetanus, diphtheria, and acellular pertussis (Tdap, Td) vaccine. You may need a Td booster every 10 years.  Zoster  vaccine. You may need this after age 72.  Pneumococcal 13-valent conjugate (PCV13) vaccine. One dose is recommended after age 72.  Pneumococcal polysaccharide (PPSV23) vaccine. One dose is recommended after age 72. Talk to your health care provider about which screenings and vaccines you need and how often you need them. This information is not intended to replace advice given to you by your health care provider. Make sure you discuss any questions you have with your health care provider. Document Released: 05/05/2015 Document Revised: 12/27/2015 Document Reviewed: 02/07/2015 Elsevier Interactive Patient Education  2017 [place] Prevention in the Home Falls can cause injuries. They can happen to people of all ages. There are many things you can do to make your home safe and to help prevent falls. What can I do on the outside of my home?  Regularly fix the edges of walkways and driveways and fix any cracks.  Remove anything that might make you trip as you walk through a door, such as a raised step or threshold.  Trim any bushes or trees on the path to your home.  Use bright outdoor lighting.  Clear any walking paths of anything that might make someone trip, such as rocks or tools.  Regularly check to see if handrails are loose or broken. Make sure that both sides of any steps have handrails.  Any raised decks and porches should have guardrails on the edges.  Have any leaves, snow, or ice cleared regularly.  Use sand or salt on walking paths during winter.  Clean up any spills in your garage right away. This includes oil or grease spills. What can I do in the bathroom?  Use night lights.  Install grab bars by the toilet and in the tub and shower. Do not use towel bars as grab bars.  Use non-skid mats or decals in the tub or shower.  If you need to sit down in the shower, use a plastic, non-slip stool.  Keep the floor dry. Clean up any water that spills on the  floor as soon as it happens.  Remove soap buildup in the tub or shower regularly.  Attach bath mats securely with double-sided non-slip rug tape.  Do not have throw rugs and other things on the floor that can make you trip. What can I do in the bedroom?  Use night lights.  Make sure that you have a light by your bed that is easy to reach.  Do not use any sheets or blankets that are too big for your bed. They should not hang down onto the floor.  Have a firm chair that has side arms. You can use this for support while you get dressed.  Do not have throw rugs and other things on the floor that can make you trip. What can I do in the kitchen?  Clean up any spills right away.  Avoid walking on wet floors.  Keep items that you use a lot in easy-to-reach places.  If you need to reach something above you, use a strong step stool that has a grab bar.  Keep electrical cords out of the way.  Do not use floor polish or wax that makes floors slippery. If you must use wax, use non-skid floor wax.  Do not have throw rugs and other things on the floor that can make you trip. What can I do with my stairs?  Do not leave any items on the stairs.  Make sure that there are handrails on both sides of the stairs and use them. Fix handrails that are broken or loose. Make sure that handrails are as long as the stairways.  Check any carpeting to make sure that it is firmly attached to the stairs. Fix any carpet that is loose or worn.  Avoid having throw rugs at the top or bottom of the stairs. If you do have throw rugs, attach them to the floor with carpet tape.  Make sure that you have a light switch at the top of the stairs and the bottom of the stairs. If you do not have them, ask someone to add them for you. What else can I do to help prevent falls?  Wear shoes that:  Do not have high heels.  Have rubber bottoms.  Are comfortable and fit you well.  Are closed at the toe. Do not wear  sandals.  If you use a stepladder:  Make sure that it is fully opened. Do not climb a closed stepladder.  Make sure that both sides of the stepladder are locked into place.  Ask someone to hold it for you, if possible.  Clearly mark and make sure that you can see:  Any grab bars or handrails.  First and last steps.  Where the edge of each step is.  Use tools that help you move around (mobility aids) if they are needed. These include:  Canes.  Walkers.  Scooters.  Crutches.  Turn on the lights when you go into a dark area. Replace any light bulbs as soon as they burn out.  Set up your furniture so you have a clear path. Avoid moving your furniture around.  If any of your floors are uneven, fix them.  If there are any pets around you, be aware of where they are.  Review your medicines with your doctor. Some medicines can make you feel dizzy. This can increase your chance of falling. Ask your doctor what other things that you can do to help prevent falls. This information is not intended to replace advice given to you by your health care provider. Make sure you discuss any questions you have with your health care provider. Document Released: 02/02/2009 Document Revised: 09/14/2015 Document Reviewed: 05/13/2014 Elsevier Interactive Patient Education  2017 [employer].

## 2020-05-30 ENCOUNTER — Other Ambulatory Visit: Payer: Self-pay

## 2020-05-30 ENCOUNTER — Ambulatory Visit: Payer: Medicare Other | Admitting: Physical Therapy

## 2020-05-30 ENCOUNTER — Encounter: Payer: Self-pay | Admitting: Physical Therapy

## 2020-05-30 DIAGNOSIS — I890 Lymphedema, not elsewhere classified: Secondary | ICD-10-CM | POA: Diagnosis not present

## 2020-05-30 DIAGNOSIS — I972 Postmastectomy lymphedema syndrome: Secondary | ICD-10-CM

## 2020-05-30 DIAGNOSIS — Z483 Aftercare following surgery for neoplasm: Secondary | ICD-10-CM | POA: Diagnosis not present

## 2020-05-30 DIAGNOSIS — R293 Abnormal posture: Secondary | ICD-10-CM | POA: Diagnosis not present

## 2020-05-30 NOTE — Therapy (Signed)
[place], [place], [address] Phone: [phone]   Fax:  [phone]  Physical Therapy Treatment  Patient Details  Name: Amanda Rollins MRN: [id number] Date of Birth: 1950-11-14 Referring Provider (PT): Dr. Morene Rankins   Encounter Date: 05/30/2020   PT End of Session - 05/30/20 1245    Visit Number 9    Number of Visits 19    Date for PT Re-Evaluation 07/04/20    PT Start Time 1003    PT Stop Time 1054    PT Time Calculation (min) 51 min    Activity Tolerance Patient tolerated treatment well    Behavior During Therapy [facility] for tasks assessed/performed           Past Medical History:  Diagnosis Date  . Breast cancer ([place]) 2011  . Diabetes mellitus type 2, controlled ([place])   . Heart murmur   . Hyperlipidemia   . Hypertension   . Hypothyroidism     Past Surgical History:  Procedure Laterality Date  . ABDOMINAL HYSTERECTOMY  2000   Still has ovaries  . [place]  07/2019  . BREAST SURGERY    . CATARACT EXTRACTION  2019   Left eye 2019, Right eye 2020  . CESAREAN SECTION  12/08/1990  . MASTECTOMY  2011   Bilateral  . ROBOTIC ASSISTED LAPAROSCOPIC VAGINAL HYSTERECTOMY WITH FIBROID REMOVAL  1991    There were no vitals filed for this visit.   Subjective Assessment - 05/30/20 1004    Subjective I got my glove but they had to special order my sleeve. It is hard to wrap my own arm. I tried to do it the other day but it didn't go well and I had to take them off the next day.    Pertinent History Pt had bilateral mastectomies with ALND of approx 20 LN in December 2011. Pt. developed cellulitis on Dec 14, and just finished Keflex.   She has  had treatment for lymphedema 3 times in the past at Cancer center in [place], most recently in 2017.  Pt. wears a  sleeve during the day, and has done some MLD. Right shoulder ROM has been limited since fracturing her humerus in 2011, and left shoulder  was hurting some today secondary to a recent fall on stairs.    Currently in Pain? No/denies    Pain Score 0-No pain                 LYMPHEDEMA/ONCOLOGY QUESTIONNAIRE - 05/30/20 0001      Right Upper Extremity Lymphedema   At Axilla  41.4 cm    15 cm Proximal to Olecranon Process 36 cm    10 cm Proximal to Olecranon Process 33.5 cm    Olecranon Process 29 cm    15 cm Proximal to Ulnar Styloid Process 28 cm    10 cm Proximal to Ulnar Styloid Process 24.8 cm    Just Proximal to Ulnar Styloid Process 18.7 cm    Across Hand at [employer] 20.8 cm    At [place] of 2nd Digit 6.3 cm              Flowsheet Row Outpatient [facility]  Lymphedema Life Impact Scale Total Score 30.88 %            [facility] Adult PT Treatment/Exercise - 05/30/20 0001      Manual Therapy   Manual Lymphatic Drainage (MLD) short neck,  activated right inguinal and left axillary LN's, 5 diaphragmatic breaths,Anterior interaxillary pathway, right axillo-inguinal pathway, Right lateral arm, medial to lateral and lateral upper arm retracing pathways and to right forearm and hand retracing all pathways.    Compression Bandaging applied coconut oil, TG soft hand to upper arm.  Wrapped fingers 1-5 with elastomull, Rosidal hand to axilla.  6 cm wrap to hand over chip pack made to decrease swelling and 2 12 cm wraps hand to axilla with TG soft pulled down over top of bandage.                       PT Long Term Goals - 05/23/20 1130      PT LONG TERM GOAL #1   Title Pt weill be independent with compression bandaging    Baseline is wrapping, but has some difficulty still    Time 3    Period Weeks    Status On-going    Target Date 06/13/20      PT LONG TERM GOAL #2   Title Pt will be independent with self MLD    Period Weeks    Status On-going    Target Date 06/20/20      PT LONG TERM GOAL #3   Title Pt will have decreased  edema at 10 cm prox to ulna and oolecranon by greater than 2.5 cm    Baseline 2 cm today    Time 6    Period Weeks    Status On-going    Target Date 07/04/20      PT LONG TERM GOAL #4   Title Pt will be understanding of precautions to prevent exacerbation of lymphedema/cellulits and will know warning signs    Time 3    Period Weeks    Status On-going      PT LONG TERM GOAL #5   Title Pt will be fit for new compression garment for improved edema management    Time 6    Period Weeks    Status New    Target Date 07/04/20                 Plan - 05/30/20 1246    Clinical Impression Statement Remeasured pt's circumferences today and overall she has reduced. She received her compression glove and still awaiting her sleeve. Continued with MLD to RUE and compression bandaging. Pt will be receiving her compression pump soon. Will continue to treat pt until she receives her compression sleeve and is able to independently manage.    PT Frequency 3x / week    PT Duration 6 weeks    PT Treatment/Interventions Therapeutic exercise;Patient/family education;Orthotic Fit/Training;Manual techniques;Passive range of motion;Compression bandaging;Manual lymph drainage;Vasopneumatic Device    PT Next Visit Plan try pink sleeve from office, continue MLD, Bandaging until garments arrive.    Consulted and Agree with Plan of Care Patient           Patient will benefit from skilled therapeutic intervention in order to improve the following deficits and impairments:  Impaired UE functional use,Decreased knowledge of precautions,Increased edema,Postural dysfunction,Decreased range of motion  Visit Diagnosis: Postmastectomy lymphedema syndrome     Problem List Patient Active Problem List   Diagnosis Date Noted  . Depression, major, single episode, mild ([place]) 04/05/2020  . Osteopenia 02/08/2020  . Lymphedema 02/08/2020  . Pulmonary nodules 02/08/2020  . Atherosclerosis of abdominal aorta  ([place]) 02/08/2020  . Hypothyroidism   . Hypertension   . Hyperlipidemia   .  Heart murmur   . Diabetes mellitus type 2, controlled ([place])   . Breast cancer [facility]) 2011    Allyson Sabal [facility] 05/30/2020, 12:49 PM  [place], [place], [address] Phone: [phone]   Fax:  [phone]  Name: Verbena Boeding MRN: [id number] Date of Birth: 1950-12-13  Manus Gunning, PT 05/30/20 12:49 PM

## 2020-06-01 ENCOUNTER — Ambulatory Visit: Payer: Medicare Other

## 2020-06-01 ENCOUNTER — Other Ambulatory Visit: Payer: Self-pay

## 2020-06-01 DIAGNOSIS — Z483 Aftercare following surgery for neoplasm: Secondary | ICD-10-CM | POA: Diagnosis not present

## 2020-06-01 DIAGNOSIS — I972 Postmastectomy lymphedema syndrome: Secondary | ICD-10-CM | POA: Diagnosis not present

## 2020-06-01 DIAGNOSIS — I890 Lymphedema, not elsewhere classified: Secondary | ICD-10-CM

## 2020-06-01 DIAGNOSIS — R293 Abnormal posture: Secondary | ICD-10-CM

## 2020-06-01 NOTE — Therapy (Signed)
[place], [place], [address] Phone: [phone]   Fax:  [phone]  Physical Therapy Treatment  Patient Details  Name: Amanda Rollins MRN: [id number] Date of Birth: [birth year] Referring Provider (PT): Dr. Morene Rankins   Encounter Date: 06/01/2020   PT End of Session - 06/01/20 1107    Visit Number 10    Number of Visits 19    Date for PT Re-Evaluation 07/04/20    PT Start Time 1005    PT Stop Time 1102    PT Time Calculation (min) 57 min    Activity Tolerance Patient tolerated treatment well    Behavior During Therapy [facility] for tasks assessed/performed           Past Medical History:  Diagnosis Date  . Breast cancer ([place]) 2011  . Diabetes mellitus type 2, controlled ([place])   . Heart murmur   . Hyperlipidemia   . Hypertension   . Hypothyroidism     Past Surgical History:  Procedure Laterality Date  . ABDOMINAL HYSTERECTOMY  2000   Still has ovaries  . [place]  07/2019  . BREAST SURGERY    . CATARACT EXTRACTION  2019   Left eye 2019, Right eye 2020  . CESAREAN SECTION  12/08/1990  . MASTECTOMY  2011   Bilateral  . ROBOTIC ASSISTED LAPAROSCOPIC VAGINAL HYSTERECTOMY WITH FIBROID REMOVAL  1991    There were no vitals filed for this visit.   Subjective Assessment - 06/01/20 1104    Pertinent History I got my glove and the flexi touch.  I tried the glove on and it feels good.  I might try and set up the flexi touch today.  Advised her to call [facility] and see if someone can go out and help her set it up.  My arm is looking good.  Its still hard to wrap it myself    Currently in Pain? No/denies                     Flowsheet Row Outpatient Re[facility]11/[facility]  Lymphedema Life Impact Scale Total Score 30.88 %            [facility] Adult PT Treatment/Exercise - 06/01/20 0001      Manual Therapy   Manual Lymphatic  Drainage (MLD) short neck, activated right inguinal and left axillary LN's, 5 diaphragmatic breaths,Anterior interaxillary pathway, right axillo-inguinal pathway, Right lateral arm, medial to lateral and lateral upper arm retracing pathways and to right forearm and hand retracing all pathways.    Compression Bandaging applied coconut oil, TG soft hand to upper arm.  Wrapped fingers 1-5 with elastomull, Rosidal hand to axilla.  6 cm wrap to hand over chip pack made to decrease swelling and 2 12 cm wraps hand to axilla with TG soft pulled down over top of bandage.                       PT Long Term Goals - 05/23/20 1130      PT LONG TERM GOAL #1   Title Pt weill be independent with compression bandaging    Baseline is wrapping, but has some difficulty still    Time 3    Period Weeks    Status On-going    Target Date 06/13/20      PT LONG TERM GOAL #2   Title Pt will be independent with self MLD  Period Weeks    Status On-going    Target Date 06/20/20      PT LONG TERM GOAL #3   Title Pt will have decreased edema at 10 cm prox to ulna and oolecranon by greater than 2.5 cm    Baseline 2 cm today    Time 6    Period Weeks    Status On-going    Target Date 07/04/20      PT LONG TERM GOAL #4   Title Pt will be understanding of precautions to prevent exacerbation of lymphedema/cellulits and will know warning signs    Time 3    Period Weeks    Status On-going      PT LONG TERM GOAL #5   Title Pt will be fit for new compression garment for improved edema management    Time 6    Period Weeks    Status New    Target Date 07/04/20                 Plan - 06/01/20 1108    Clinical Impression Statement Pts hand and arm continues to look very good.  She tried on the glove and it is comfortable, but is still awaiting her sleeve.  Compression pump arrived yesterday.  Pt was advised to call [facility] to have someone come out to set it up for her.  Awaiting compression  sleeve and will continue treatment until she can independently manage her lymphedema.  She goes out of town next Tuuesday through the weekend.    Stability/Clinical Decision Making Stable/Uncomplicated    Rehab Potential Good    PT Frequency 3x / week    PT Duration 6 weeks    PT Treatment/Interventions Therapeutic exercise;Patient/family education;Orthotic Fit/Training;Manual techniques;Passive range of motion;Compression bandaging;Manual lymph drainage;Vasopneumatic Device    PT Next Visit Plan continue MLD, bandaging until garments arrive    Consulted and Agree with Plan of Care Patient           Patient will benefit from skilled therapeutic intervention in order to improve the following deficits and impairments:  Impaired UE functional use,Decreased knowledge of precautions,Increased edema,Postural dysfunction,Decreased range of motion  Visit Diagnosis: Postmastectomy lymphedema syndrome  Secondary lymphedema  Aftercare following surgery for neoplasm  Abnormal posture     Problem List Patient Active Problem List   Diagnosis Date Noted  . Depression, major, single episode, mild ([place]) 04/05/2020  . Osteopenia 02/08/2020  . Lymphedema 02/08/2020  . Pulmonary nodules 02/08/2020  . Atherosclerosis of abdominal aorta ([place]) 02/08/2020  . Hypothyroidism   . Hypertension   . Hyperlipidemia   . Heart murmur   . Diabetes mellitus type 2, controlled ([place])   . Breast cancer [facility]) 2011    Amanda Rollins 06/01/2020, 11:12 AM  [place], [place], [address] Phone: [phone]   Fax:  [phone]  Name: Amanda Rollins MRN: [id number] Date of Birth: Oct 31, 1950 Cheral Almas, PT 06/01/20 11:12 AM

## 2020-06-05 ENCOUNTER — Ambulatory Visit: Payer: Medicare Other

## 2020-06-05 ENCOUNTER — Other Ambulatory Visit: Payer: Self-pay

## 2020-06-05 DIAGNOSIS — Z483 Aftercare following surgery for neoplasm: Secondary | ICD-10-CM | POA: Diagnosis not present

## 2020-06-05 DIAGNOSIS — I972 Postmastectomy lymphedema syndrome: Secondary | ICD-10-CM | POA: Diagnosis not present

## 2020-06-05 DIAGNOSIS — R293 Abnormal posture: Secondary | ICD-10-CM | POA: Diagnosis not present

## 2020-06-05 DIAGNOSIS — I890 Lymphedema, not elsewhere classified: Secondary | ICD-10-CM | POA: Diagnosis not present

## 2020-06-05 NOTE — Therapy (Signed)
[place], [place], [address] Phone: [phone]   Fax:  [phone]  Physical Therapy Treatment  Patient Details  Name: Amanda Rollins MRN: [id number] Date of Birth: 1951/03/01 Referring Provider (PT): Dr. Morene Rankins   Encounter Date: 06/05/2020   PT End of Session - 06/05/20 1158    Visit Number 11    Number of Visits 19    Date for PT Re-Evaluation 07/04/20    PT Start Time 1004    PT Stop Time 1100    PT Time Calculation (min) 56 min    Activity Tolerance Patient tolerated treatment well    Behavior During Therapy [facility] for tasks assessed/performed           Past Medical History:  Diagnosis Date  . Breast cancer ([place]) 2011  . Diabetes mellitus type 2, controlled ([place])   . Heart murmur   . Hyperlipidemia   . Hypertension   . Hypothyroidism     Past Surgical History:  Procedure Laterality Date  . ABDOMINAL HYSTERECTOMY  2000   Still has ovaries  . [place]  07/2019  . BREAST SURGERY    . CATARACT EXTRACTION  2019   Left eye 2019, Right eye 2020  . CESAREAN SECTION  12/08/1990  . MASTECTOMY  2011   Bilateral  . ROBOTIC ASSISTED LAPAROSCOPIC VAGINAL HYSTERECTOMY WITH FIBROID REMOVAL  1991    There were no vitals filed for this visit.   Subjective Assessment - 06/05/20 1006    Subjective Got my sleeve over the weekend.  wore it yesterday and it feels comfortable. It might need to go a little higher.  My arm looks good. [name] is coming to set up the pump next Monday.    Pertinent History Pt had bilateral mastectomies with ALND of approx 20 LN in December 2011. Pt. developed cellulitis on Dec 14, and just finished Keflex.   She has  had treatment for lymphedema 3 times in the past at Cancer center in [place], most recently in 2017.  Pt. wears a  sleeve during the day, and has done some MLD. Right shoulder ROM has been limited since fracturing her humerus in 2011, and left  shoulder was hurting from a recent fall on stairs.    Currently in Pain? No/denies                 LYMPHEDEMA/ONCOLOGY QUESTIONNAIRE - 06/05/20 0001      Right Upper Extremity Lymphedema   At Axilla  40 cm    15 cm Proximal to Olecranon Process 35 cm    10 cm Proximal to Olecranon Process 33.3 cm    Olecranon Process 28.5 cm    15 cm Proximal to Ulnar Styloid Process 28.1 cm    10 cm Proximal to Ulnar Styloid Process 24.5 cm    Just Proximal to Ulnar Styloid Process 18 cm    Across Hand at [employer] 19.6 cm    At [place] of 2nd Digit 6.4 cm              Flowsheet Row Outpatient [facility]  Lymphedema Life Impact Scale Total Score 30.88 %            [facility] Adult PT Treatment/Exercise - 06/05/20 0001      Manual Therapy   Manual Lymphatic Drainage (MLD) short neck, activated right inguinal and left axillary LN's, 5 diaphragmatic breaths,Anterior interaxillary pathway, right axillo-inguinal pathway, Right  lateral arm, medial to lateral and lateral upper arm retracing pathways and to right forearm and hand retracing all pathways.    Compression Bandaging TG soft hand to upper arm.  Wrapped fingers 1-5 with elastomull, Rosidal hand to axilla.  6 cm wrap to hand  2 12 cm wraps hand to axilla with TG soft pulled down over top of bandage tape applied to fingers 1-5 to prevent picking of elastomull                       PT Long Term Goals - 05/23/20 1130      PT LONG TERM GOAL #1   Title Pt weill be independent with compression bandaging    Baseline is wrapping, but has some difficulty still    Time 3    Period Weeks    Status On-going    Target Date 06/13/20      PT LONG TERM GOAL #2   Title Pt will be independent with self MLD    Period Weeks    Status On-going    Target Date 06/20/20      PT LONG TERM GOAL #3   Title Pt will have decreased edema at 10 cm prox to ulna and oolecranon  by greater than 2.5 cm    Baseline 2 cm today    Time 6    Period Weeks    Status On-going    Target Date 07/04/20      PT LONG TERM GOAL #4   Title Pt will be understanding of precautions to prevent exacerbation of lymphedema/cellulits and will know warning signs    Time 3    Period Weeks    Status On-going      PT LONG TERM GOAL #5   Title Pt will be fit for new compression garment for improved edema management    Time 6    Period Weeks    Status New    Target Date 07/04/20                 Plan - 06/05/20 1159    Clinical Impression Statement Pt has been wearing her compression sleeve and glove and is happy with the fit and feel although she had been wearing lower at her upper arm than she should have.  Arm was remeasured with good results.  She has recieved her flexi touch and [name] is going to meet her next Monday to help her set it up at home.  We will have a follow up next week to be sure she is all set and to make sure she can don the sleeve and wear correctly, and has no questions going forward. She is out of town until this weekend.   Stability/Clinical Decision Making Stable/Uncomplicated    Rehab Potential Good    PT Frequency 3x / week    PT Duration 6 weeks    PT Treatment/Interventions Therapeutic exercise;Patient/family education;Orthotic Fit/Training;Manual techniques;Passive range of motion;Compression bandaging;Manual lymph drainage;Vasopneumatic Device    PT Next Visit Plan Review donning/doffing sleeve, check length of sleeve, remeasure arm, check ease compliance with flexitouch, review precautions    Consulted and Agree with Plan of Care Patient           Patient will benefit from skilled therapeutic intervention in order to improve the following deficits and impairments:  Impaired UE functional use,Decreased knowledge of precautions,Increased edema,Postural dysfunction,Decreased range of motion  Visit Diagnosis: Postmastectomy lymphedema  syndrome  Secondary lymphedema  Aftercare  following surgery for neoplasm  Abnormal posture     Problem List Patient Active Problem List   Diagnosis Date Noted  . Depression, major, single episode, mild ([place]) 04/05/2020  . Osteopenia 02/08/2020  . Lymphedema 02/08/2020  . Pulmonary nodules 02/08/2020  . Atherosclerosis of abdominal aorta ([place]) 02/08/2020  . Hypothyroidism   . Hypertension   . Hyperlipidemia   . Heart murmur   . Diabetes mellitus type 2, controlled ([place])   . Breast cancer [facility]) 2011    Claris Pong 06/05/2020, 12:08 PM  [place], [place], [address] Phone: [phone]   Fax:  [phone]  Name: Amanda Rollins MRN: [id number] Date of Birth: Aug 31, 1950 Cheral Almas, PT 06/05/20 12:08 PM

## 2020-06-12 ENCOUNTER — Encounter: Payer: Self-pay | Admitting: Physician Assistant

## 2020-06-12 MED ORDER — RYBELSUS 3 MG PO TABS: 1.0000 | ORAL_TABLET | Freq: Every day | ORAL | 2 refills | Status: DC

## 2020-06-13 ENCOUNTER — Telehealth: Payer: Self-pay | Admitting: *Deleted

## 2020-06-13 NOTE — Telephone Encounter (Signed)
Received fax from pharmacy PA needed for Rybelsus. Called BC/BS at [phone] and spoke to [place] answered all questions. She said as far as she can tell will be approved will send to pharmacist and will call pt and send [place] a fax once officially approved. Awaiting response.

## 2020-06-15 ENCOUNTER — Ambulatory Visit: Payer: Medicare Other

## 2020-06-15 ENCOUNTER — Other Ambulatory Visit: Payer: Self-pay

## 2020-06-15 DIAGNOSIS — I972 Postmastectomy lymphedema syndrome: Secondary | ICD-10-CM

## 2020-06-15 DIAGNOSIS — I890 Lymphedema, not elsewhere classified: Secondary | ICD-10-CM

## 2020-06-15 DIAGNOSIS — Z483 Aftercare following surgery for neoplasm: Secondary | ICD-10-CM | POA: Diagnosis not present

## 2020-06-15 DIAGNOSIS — R293 Abnormal posture: Secondary | ICD-10-CM | POA: Diagnosis not present

## 2020-06-15 NOTE — Therapy (Signed)
[place], [place], [address] Phone: [phone]   Fax:  [phone]  Physical Therapy Treatment  Patient Details  Name: Amanda Rollins MRN: [id number] Date of Birth: 01-23-1951 Referring Provider (PT): Dr. Morene Rankins   Encounter Date: 06/15/2020   PT End of Session - 06/15/20 1211    Visit Number 12    Number of Visits 19    Date for PT Re-Evaluation 07/04/20    PT Start Time 1102    PT Stop Time 1205    PT Time Calculation (min) 63 min    Activity Tolerance Patient tolerated treatment well    Behavior During Therapy [facility] for tasks assessed/performed           Past Medical History:  Diagnosis Date  . Breast cancer ([place]) 2011  . Diabetes mellitus type 2, controlled ([place])   . Heart murmur   . Hyperlipidemia   . Hypertension   . Hypothyroidism     Past Surgical History:  Procedure Laterality Date  . ABDOMINAL HYSTERECTOMY  2000   Still has ovaries  . [place]  07/2019  . BREAST SURGERY    . CATARACT EXTRACTION  2019   Left eye 2019, Right eye 2020  . CESAREAN SECTION  12/08/1990  . MASTECTOMY  2011   Bilateral  . ROBOTIC ASSISTED LAPAROSCOPIC VAGINAL HYSTERECTOMY WITH FIBROID REMOVAL  1991    There were no vitals filed for this visit.   Subjective Assessment - 06/15/20 1101    Subjective Sleeve is doing well I think.  [name] didn't come to set up the pump on Monday but she will come tomorrow.    Pertinent History Pt had bilateral mastectomies with ALND of approx 20 LN in December 2011. Pt. developed cellulitis on Dec 14, and just finished Keflex.   She has  had treatment for lymphedema 3 times in the past at Cancer center in [place], most recently in 2017.  Pt. wears a  sleeve during the day, and has done some MLD. Right shoulder ROM has been limited since fracturing her humerus in 2011, and left shoulder was hurting from a recent fall on stairs.    Currently in Pain? No/denies     Pain Score 0-No pain                 LYMPHEDEMA/ONCOLOGY QUESTIONNAIRE - 06/15/20 0001      Right Upper Extremity Lymphedema   At Axilla  39.2 cm    15 cm Proximal to Olecranon Process 34.5 cm    10 cm Proximal to Olecranon Process 33.4 cm    Olecranon Process 28.3 cm    15 cm Proximal to Ulnar Styloid Process 28.8 cm    10 cm Proximal to Ulnar Styloid Process 24.8 cm    Just Proximal to Ulnar Styloid Process 18.6 cm    Across Hand at [employer] 20.2 cm    At [place] of 2nd Digit 6.4 cm              Flowsheet Row [facility]patient Re[facility]  Lymphedema Life Impact Scale Total Score 30.88 %            [facility] Adult PT Treatment/Exercise - 06/15/20 0001      Manual Therapy   Edema Management remeasured circumference    Manual Lymphatic Drainage (MLD) short neck, activated right inguinal and left axillary LN's, 5 diaphragmatic breaths,Anterior interaxillary pathway, right axillo-inguinal pathway, Right lateral  arm, medial to lateral and lateral upper arm retracing pathways and to right forearm and hand retracing all pathways.    Compression Bandaging TG soft hand to upper arm.  Wrapped fingers 1-5 with elastomull, Rosidal hand to axilla.  6 cm wrap to hand  over chip pack, 2 12 cm wraps hand to axilla with TG soft pulled down over top of bandage tape applied to fingers 1-5 to prevent picking of elastomull                       PT Long Term Goals - 05/23/20 1130      PT LONG TERM GOAL #1   Title Pt weill be independent with compression bandaging    Baseline is wrapping, but has some difficulty still    Time 3    Period Weeks    Status On-going    Target Date 06/13/20      PT LONG TERM GOAL #2   Title Pt will be independent with self MLD    Period Weeks    Status On-going    Target Date 06/20/20      PT LONG TERM GOAL #3   Title Pt will have decreased edema at 10 cm prox to ulna and  oolecranon by greater than 2.5 cm    Baseline 2 cm today    Time 6    Period Weeks    Status On-going    Target Date 07/04/20      PT LONG TERM GOAL #4   Title Pt will be understanding of precautions to prevent exacerbation of lymphedema/cellulits and will know warning signs    Time 3    Period Weeks    Status On-going      PT LONG TERM GOAL #5   Title Pt will be fit for new compression garment for improved edema management    Time 6    Period Weeks    Status New    Target Date 07/04/20                 Plan - 06/15/20 1212    Clinical Impression Statement pt has been compliant with sleeve and glove wear, but she has not laundered recently.  She will have Flexi touch set up tomorrow and is looking forward to using it.She is able to don/doff her sleeve and glove but does sometimes have trouble getting it high enough.  Circumferential measurements are down overall, but increased a small amount in several places since last measurement. pt will continue self management with sleeve/glove and Flexi touch and will follow up on 06/28/20    Stability/Clinical Decision Making Stable/Uncomplicated    Rehab Potential Good    PT Frequency 3x / week    PT Duration 6 weeks    PT Treatment/Interventions Therapeutic exercise;Patient/family education;Orthotic Fit/Training;Manual techniques;Passive range of motion;Compression bandaging;Manual lymph drainage;Vasopneumatic Device    PT Next Visit Plan remeasure arm, discuss flexi touchprn, review precautions.  DC or Hold,    Consulted and Agree with Plan of Care Patient           Patient will benefit from skilled therapeutic intervention in order to improve the following deficits and impairments:  Impaired UE functional use,Decreased knowledge of precautions,Increased edema,Postural dysfunction,Decreased range of motion  Visit Diagnosis: Secondary lymphedema  Postmastectomy lymphedema syndrome  Aftercare following surgery for  neoplasm  Abnormal posture     Problem List Patient Active Problem List   Diagnosis Date Noted  . Depression,  major, single episode, mild ([place]) 04/05/2020  . Osteopenia 02/08/2020  . Lymphedema 02/08/2020  . Pulmonary nodules 02/08/2020  . Atherosclerosis of abdominal aorta ([place]) 02/08/2020  . Hypothyroidism   . Hypertension   . Hyperlipidemia   . Heart murmur   . Diabetes mellitus type 2, controlled ([place])   . Breast cancer [facility]) 2011    Claris Pong 06/15/2020, 12:16 PM  [place], [place], [address] Phone: [phone]   Fax:  [phone]  Name: Danyal Adorno MRN: [id number] Date of Birth: 12/18/1950 Cheral Almas, PT 06/15/20 12:17 PM

## 2020-06-15 NOTE — Telephone Encounter (Signed)
Received fax from BC/BS for approval of Rybelsus 3 mg tablets for one year. Called [facility] and spoke to [place] told her PA for Rybelsus has been approved. Jasmine ran the Rx it went through and will be $37.00. told her okay thanks.

## 2020-06-15 NOTE — Telephone Encounter (Signed)
Spoke to pt told her PA for Rybelsus 3 mg tablets has been approved for one year. The pharmacy said it will be $37.00. Pt verbalized understanding.

## 2020-06-15 NOTE — Telephone Encounter (Signed)
Patient is returning a call from Butch Penny about medication.

## 2020-06-15 NOTE — Telephone Encounter (Signed)
Left message on voicemail to call office.  

## 2020-06-15 NOTE — Telephone Encounter (Signed)
Received call from [place] with BC/BS, calling about Rx for pt Rybelsus 3 mg has been approved. Effective 06/13/2020 through 06/13/2021. Told her okay please send fax approval. Tiffany said she will fax it over. Awaiting fax.

## 2020-06-22 ENCOUNTER — Encounter: Payer: Self-pay | Admitting: Physician Assistant

## 2020-06-22 MED ORDER — LEVOTHYROXINE SODIUM 50 MCG PO TABS: 50.0000 ug | ORAL_TABLET | Freq: Every day | ORAL | 1 refills | Status: DC

## 2020-06-22 NOTE — Telephone Encounter (Signed)
Spoke to pt told her I have not received anything about Rx's . The Crestor Rx was sent in Dec with 3 refills and it looks like we have not filled your Levothyroxine. I will send that Rx now for you, pharmacy clarified. Pt verbalized understanding.

## 2020-06-27 ENCOUNTER — Other Ambulatory Visit: Payer: Self-pay

## 2020-06-27 ENCOUNTER — Ambulatory Visit: Payer: Medicare Other | Attending: Physician Assistant

## 2020-06-27 DIAGNOSIS — I890 Lymphedema, not elsewhere classified: Secondary | ICD-10-CM | POA: Insufficient documentation

## 2020-06-27 DIAGNOSIS — I972 Postmastectomy lymphedema syndrome: Secondary | ICD-10-CM | POA: Insufficient documentation

## 2020-06-27 DIAGNOSIS — R293 Abnormal posture: Secondary | ICD-10-CM | POA: Diagnosis not present

## 2020-06-27 DIAGNOSIS — Z483 Aftercare following surgery for neoplasm: Secondary | ICD-10-CM | POA: Insufficient documentation

## 2020-06-27 NOTE — Therapy (Signed)
[place], [place], [address] Phone: [phone]   Fax:  [phone]  Physical Therapy Treatment  Patient Details  Name: Amanda Rollins MRN: [id number] Date of Birth: 03/02/1951 Referring Provider (PT): Dr. Morene Rankins   Encounter Date: 06/27/2020   PT End of Session - 06/27/20 1054    Visit Number 13    Number of Visits 19    Date for PT Re-Evaluation 07/04/20    PT Start Time 1003    PT Stop Time 1054    PT Time Calculation (min) 51 min    Activity Tolerance Patient tolerated treatment well    Behavior During Therapy [facility] for tasks assessed/performed           Past Medical History:  Diagnosis Date  . Breast cancer ([place]) 2011  . Diabetes mellitus type 2, controlled ([place])   . Heart murmur   . Hyperlipidemia   . Hypertension   . Hypothyroidism     Past Surgical History:  Procedure Laterality Date  . ABDOMINAL HYSTERECTOMY  2000   Still has ovaries  . [place]  07/2019  . BREAST SURGERY    . CATARACT EXTRACTION  2019   Left eye 2019, Right eye 2020  . CESAREAN SECTION  12/08/1990  . MASTECTOMY  2011   Bilateral  . ROBOTIC ASSISTED LAPAROSCOPIC VAGINAL HYSTERECTOMY WITH FIBROID REMOVAL  1991    There were no vitals filed for this visit.   Subjective Assessment - 06/27/20 1006    Subjective Things are going well.  I got my pump and I have used it every day.  It feels really good and my arm is alot smaller. I leave to go out of town tomorrow to [facility].    Pertinent History Pt had bilateral mastectomies with ALND of approx 20 LN in December 2011. Pt. developed cellulitis on Dec 14, and just finished Keflex.   She has  had treatment for lymphedema 3 times in the past at Cancer center in [place], most recently in 2017.  Pt. wears a  sleeve during the day, and has done some MLD. Right shoulder ROM has been limited since fracturing her humerus in 2011, and left shoulder was  hurting from a recent fall on stairs.    Currently in Pain? No/denies    Pain Score 0-No pain                 LYMPHEDEMA/ONCOLOGY QUESTIONNAIRE - 06/27/20 0001      Right Upper Extremity Lymphedema   At Axilla  39.5 cm    15 cm Proximal to Olecranon Process 35 cm    10 cm Proximal to Olecranon Process 33.4 cm    Olecranon Process 28 cm    15 cm Proximal to Ulnar Styloid Process 29.2 cm    10 cm Proximal to Ulnar Styloid Process 25 cm    Just Proximal to Ulnar Styloid Process 19 cm    Across Hand at [employer] 20.2 cm    At [place] of 2nd Digit 6.6 cm      Left Upper Extremity Lymphedema   15 cm Proximal to Olecranon Process 33.3 cm    10 cm Proximal to Olecranon Process 29.8 cm    Olecranon Process 25.2 cm    15 cm Proximal to Ulnar Styloid Process 21.4 cm    10 cm Proximal to Ulnar Styloid Process 18.1 cm    Just Proximal to Ulnar Styloid Process 15.2 cm  Across Hand at [employer] 19.5 cm    At [place] of 2nd Digit 6.6 cm              Flowsheet Row Outpatient Rehab from 04/11/2020 in [place]  Lymphedema Life Impact Scale Total Score 30.88 %            [facility] Adult PT Treatment/Exercise - 06/27/20 0001      Manual Therapy   Edema Management remeasured circumference    Manual Lymphatic Drainage (MLD) short neck, activated right inguinal and left axillary LN's, 5 diaphragmatic breaths,Anterior interaxillary pathway, right axillo-inguinal pathway, Right lateral arm, medial to lateral and lateral upper arm retracing pathways and to right forearm and hand retracing all pathways.    Compression Bandaging Pt applied compression sleeve and glove after MLD                      PT Long Term Goals - 05/23/20 1130      PT LONG TERM GOAL #1   Title Pt weill be independent with compression bandaging    Baseline is wrapping, but has some difficulty still    Time 3    Period Weeks    Status On-going     Target Date 06/13/20      PT LONG TERM GOAL #2   Title Pt will be independent with self MLD    Period Weeks    Status On-going    Target Date 06/20/20      PT LONG TERM GOAL #3   Title Pt will have decreased edema at 10 cm prox to ulna and oolecranon by greater than 2.5 cm    Baseline 2 cm today    Time 6    Period Weeks    Status On-going    Target Date 07/04/20      PT LONG TERM GOAL #4   Title Pt will be understanding of precautions to prevent exacerbation of lymphedema/cellulits and will know warning signs    Time 3    Period Weeks    Status On-going      PT LONG TERM GOAL #5   Title Pt will be fit for new compression garment for improved edema management    Time 6    Period Weeks    Status New    Target Date 07/04/20                 Plan - 06/27/20 1055    Clinical Impression Statement pt is compliant with sleeve and glove and is using her flexitouch on a daily basis.  She is very pleased with the reduction she gets with the pump.  She is not wrapping at night and does not want the cost of a [place].  We discussed that she may wrap prn if she starts to see more swelling that develops at night.  We will follow up again in 4 weeks and likely DC at that point provided she continues to do well.    Personal Factors and Comorbidities Education    Stability/Clinical Decision Making Stable/Uncomplicated    Rehab Potential Good    PT Frequency 3x / week    PT Duration 6 weeks    PT Treatment/Interventions Therapeutic exercise;Patient/family education;Orthotic Fit/Training;Manual techniques;Passive range of motion;Compression bandaging;Manual lymph drainage;Vasopneumatic Device    PT Next Visit Plan remeasure arm, discuss flexi touchprn, review precautions.  DC or Hold,    Consulted and Agree with Plan of Care Patient  Patient will benefit from skilled therapeutic intervention in order to improve the following deficits and impairments:  Impaired UE functional  use,Decreased knowledge of precautions,Increased edema,Postural dysfunction,Decreased range of motion  Visit Diagnosis: Secondary lymphedema  Postmastectomy lymphedema syndrome  Aftercare following surgery for neoplasm  Abnormal posture     Problem List Patient Active Problem List   Diagnosis Date Noted  . Depression, major, single episode, mild ([place]) 04/05/2020  . Osteopenia 02/08/2020  . Lymphedema 02/08/2020  . Pulmonary nodules 02/08/2020  . Atherosclerosis of abdominal aorta ([place]) 02/08/2020  . Hypothyroidism   . Hypertension   . Hyperlipidemia   . Heart murmur   . Diabetes mellitus type 2, controlled ([place])   . Breast cancer [facility]) 2011    Claris Pong 06/27/2020, 11:00 AM  [place], [place], [address] Phone: [phone]   Fax:  [phone]  Name: Amanda Rollins MRN: [id number] Date of Birth: 21-Jun-1950  Cheral Almas, PT 06/27/20 11:01 AM

## 2020-07-24 ENCOUNTER — Encounter: Payer: Self-pay | Admitting: Physician Assistant

## 2020-07-26 ENCOUNTER — Ambulatory Visit (INDEPENDENT_AMBULATORY_CARE_PROVIDER_SITE_OTHER): Payer: Medicare Other | Admitting: Physician Assistant

## 2020-07-26 ENCOUNTER — Encounter: Payer: Self-pay | Admitting: Physician Assistant

## 2020-07-26 ENCOUNTER — Other Ambulatory Visit: Payer: Self-pay

## 2020-07-26 ENCOUNTER — Other Ambulatory Visit: Payer: Self-pay | Admitting: Physician Assistant

## 2020-07-26 VITALS — BP 120/60 | HR 86 | Temp 97.9°F | Ht 65.0 in | Wt 189.4 lb

## 2020-07-26 DIAGNOSIS — E039 Hypothyroidism, unspecified: Secondary | ICD-10-CM | POA: Diagnosis not present

## 2020-07-26 DIAGNOSIS — L989 Disorder of the skin and subcutaneous tissue, unspecified: Secondary | ICD-10-CM

## 2020-07-26 DIAGNOSIS — I10 Essential (primary) hypertension: Secondary | ICD-10-CM | POA: Diagnosis not present

## 2020-07-26 DIAGNOSIS — E119 Type 2 diabetes mellitus without complications: Secondary | ICD-10-CM

## 2020-07-26 DIAGNOSIS — E785 Hyperlipidemia, unspecified: Secondary | ICD-10-CM | POA: Diagnosis not present

## 2020-07-26 LAB — COMPREHENSIVE METABOLIC PANEL
ALT: 12 U/L (ref 0–35)
AST: 13 U/L (ref 0–37)
Albumin: 4.4 g/dL (ref 3.5–5.2)
Alkaline Phosphatase: 35 U/L — ABNORMAL LOW (ref 39–117)
BUN: 13 mg/dL (ref 6–23)
CO2: 28 mEq/L (ref 19–32)
Calcium: 10 mg/dL (ref 8.4–10.5)
Chloride: 98 mEq/L (ref 96–112)
Creatinine, Ser: 0.72 mg/dL (ref 0.40–1.20)
GFR: 83.84 mL/min (ref >60.00)
Glucose, Bld: 157 mg/dL — ABNORMAL HIGH (ref 70–99)
Potassium: 4 mEq/L (ref 3.5–5.1)
Sodium: 135 mEq/L (ref 135–145)
Total Bilirubin: 0.5 mg/dL (ref 0.2–1.2)
Total Protein: 6.9 g/dL (ref 6.0–8.3)

## 2020-07-26 LAB — LIPID PANEL
Cholesterol: 173 mg/dL (ref 0–200)
HDL: 53.9 mg/dL (ref >39.00)
LDL Cholesterol: 92 mg/dL (ref 0–99)
NonHDL: 119.36
Total CHOL/HDL Ratio: 3
Triglycerides: 138 mg/dL (ref 0.0–149.0)
VLDL: 27.6 mg/dL (ref 0.0–40.0)

## 2020-07-26 LAB — HEMOGLOBIN A1C: Hgb A1c MFr Bld: 7.8 % — ABNORMAL HIGH (ref 4.6–6.5)

## 2020-07-26 LAB — TSH: TSH: 1.9 u[IU]/mL (ref 0.35–4.50)

## 2020-07-26 MED ORDER — AMLODIPINE BESYLATE 5 MG PO TABS: 5.0000 mg | ORAL_TABLET | Freq: Every day | ORAL | 3 refills | Status: DC

## 2020-07-26 MED ORDER — LOSARTAN POTASSIUM-HCTZ 100-12.5 MG PO TABS: 1.0000 | ORAL_TABLET | Freq: Every day | ORAL | 3 refills | Status: DC

## 2020-07-26 MED ORDER — ROSUVASTATIN CALCIUM 5 MG PO TABS: 5.0000 mg | ORAL_TABLET | Freq: Every day | ORAL | 1 refills | Status: DC

## 2020-07-26 MED ORDER — LEVOTHYROXINE SODIUM 50 MCG PO TABS: 50.0000 ug | ORAL_TABLET | Freq: Every day | ORAL | 1 refills | Status: DC

## 2020-07-26 MED ORDER — TRIAMCINOLONE ACETONIDE 0.5 % EX OINT
TOPICAL_OINTMENT | CUTANEOUS | 0 refills | Status: DC
Start: 2020-07-26 — End: 2023-07-28

## 2020-07-26 NOTE — Progress Notes (Signed)
Amanda Rollins is a 70 y.o. female is here for follow up.  I acted as a Education administrator for [employer], PA-C Anselmo Pickler, LPN   History of Present Illness:   Chief Complaint  Patient presents with  . Diabetes  . Hypertension  . Hypothyroidism  . Hyperlipidemia    HPI   Diabetes Currently taking Rybelsus 3 mg daily. Compliant with medication. She is not checking blood sugars at home. Denies symptoms of hypoglycemia or hyperglycemia. No neuropathy. Feels like it is curbing her appetite. She is walking 2+ miles daily. Has stopped artificial sugar, watching portions.  Wt Readings from Last 5 Encounters:  07/26/20 189 lb 6.1 oz (85.9 kg)  05/26/20 194 lb 12.8 oz (88.4 kg)  04/07/20 195 lb (88.5 kg)  04/04/20 197 lb (89.4 kg)  03/27/20 197 lb 9.6 oz (89.6 kg)   Lab Results  Component Value Date   HGBA1C 7.5 (H) 03/27/2020    Hypertension Currently taking Losartan-HCTZ 100-12.5 mg, Norvasc 5 mg. At home blood pressure readings are: not checked. Pt denies headaches, dizziness, blurred vision, chest pain, SOB or lower leg edema. Denies excessive caffeine intake, stimulant usage, excessive alcohol intake or increase in salt consumption.  BP Readings from Last 3 Encounters:  07/26/20 120/60  05/26/20 110/68  04/07/20 124/76    Hypothyroidism Currently taking Levothyroxine 50 mcg daily. Compliant with medication. Denies fatigue, no hair or nail changes, heat/cold intolerance, weight change, palpitations or nervousness.   Hyperlipidemia Currently taking Crestor 5 mg daily. Needs refill. Denies muscle cramps.   Skin lesion Has had a small skin lesion to her R forearm for several years. Feels like its getting more irritated/itchy/raised. Has not tried any rx cream for it. No fam hx of skin cancer.   There are no preventive care reminders to display for this patient.  Past Medical History:  Diagnosis Date  . Breast cancer ([place]) 2011  . Diabetes mellitus type 2,  controlled ([place])   . Heart murmur   . Hyperlipidemia   . Hypertension   . Hypothyroidism      Social History   Tobacco Use  . Smoking status: Former Smoker    Start date: 02/07/1970  . Smokeless tobacco: Never Used  Substance Use Topics  . Alcohol use: Yes    Alcohol/week: 3.0 standard drinks    Types: 3 Glasses of wine per week  . Drug use: Never    Past Surgical History:  Procedure Laterality Date  . ABDOMINAL HYSTERECTOMY  2000   Still has ovaries  . [place]  07/2019  . BREAST SURGERY    . CATARACT EXTRACTION  2019   Left eye 2019, Right eye 2020  . CESAREAN SECTION  12/08/1990  . MASTECTOMY  2011   Bilateral  . ROBOTIC ASSISTED LAPAROSCOPIC VAGINAL HYSTERECTOMY WITH FIBROID REMOVAL  1991    Family History  Adopted: Yes  Problem Relation Age of Onset  . Cancer Mother        small cell cancer of liver and lungs    PMHx, SurgHx, SocialHx, FamHx, Medications, and Allergies were reviewed in the Visit Navigator and updated as appropriate.   Patient Active Problem List   Diagnosis Date Noted  . Depression, major, single episode, mild ([place]) 04/05/2020  . Osteopenia 02/08/2020  . Lymphedema 02/08/2020  . Pulmonary nodules 02/08/2020  . Atherosclerosis of abdominal aorta ([place]) 02/08/2020  . Hypothyroidism   . Hypertension   . Hyperlipidemia   . Heart murmur   . Diabetes  mellitus type 2, controlled ([place])   . Breast cancer ([place]) 2011    Social History   Tobacco Use  . Smoking status: Former Smoker    Start date: 02/07/1970  . Smokeless tobacco: Never Used  Substance Use Topics  . Alcohol use: Yes    Alcohol/week: 3.0 standard drinks    Types: 3 Glasses of wine per week  . Drug use: Never    Current Medications and Allergies:    Current Outpatient Medications:  .  amLODipine (NORVASC) 5 MG tablet, Take 1 tablet (5 mg total) by mouth daily., Disp: 90 tablet, Rfl: 3 .  calcium-vitamin D (OSCAL WITH D) 250-125 MG-UNIT tablet, Take 1 tablet  by mouth daily., Disp: , Rfl:  .  denosumab (PROLIA) 60 MG/ML SOSY injection, Inject 60 mg into the skin every 6 (six) months., Disp: , Rfl:  .  fluticasone (FLONASE) 50 MCG/ACT nasal spray, SHAKE LIQUID AND USE 2 SPRAYS IN EACH NOSTRIL DAILY, Disp: 16 g, Rfl: 2 .  levothyroxine (SYNTHROID) 50 MCG tablet, Take 1 tablet (50 mcg total) by mouth daily., Disp: 90 tablet, Rfl: 1 .  losartan-hydrochlorothiazide (HYZAAR) 100-12.5 MG tablet, Take 1 tablet by mouth daily., Disp: 90 tablet, Rfl: 3 .  magnesium gluconate (MAGONATE) 500 MG tablet, Take 500 mg by mouth 2 (two) times daily., Disp: , Rfl:  .  rosuvastatin (CRESTOR) 5 MG tablet, Take 1 tablet (5 mg total) by mouth daily., Disp: 90 tablet, Rfl: 1 .  Semaglutide (RYBELSUS) 3 MG TABS, Take 1 tablet by mouth daily in the afternoon., Disp: 30 tablet, Rfl: 2 .  triamcinolone ointment (KENALOG) 0.5 %, Use twice daily, Disp: 15 g, Rfl: 0   Allergies  Allergen Reactions  . Sulfa Antibiotics Rash  . Metformin Diarrhea    Review of Systems   ROS Negative unless otherwise specified per HPI.  Vitals:   Vitals:   07/26/20 0904  BP: 120/60  Pulse: 86  Temp: 97.9 F (36.6 C)  TempSrc: Temporal  SpO2: 94%  Weight: 189 lb 6.1 oz (85.9 kg)  Height: 5\' 5"  (1.651 m)     Body mass index is 31.51 kg/m.   Physical Exam:    Physical Exam Vitals and nursing note reviewed.  Constitutional:      General: She is not in acute distress.    Appearance: She is well-developed. She is not ill-appearing or toxic-appearing.  Cardiovascular:     Rate and Rhythm: Normal rate and regular rhythm.     Pulses: Normal pulses.     Heart sounds: Normal heart sounds, S1 normal and S2 normal.     Comments: No LE edema Pulmonary:     Effort: Pulmonary effort is normal.     Breath sounds: Normal breath sounds.  Skin:    General: Skin is warm and dry.     Comments: Erythematous well-defined plaquw to R posterior forearm  Neurological:     Mental Status: She  is alert.     GCS: GCS eye subscore is 4. GCS verbal subscore is 5. GCS motor subscore is 6.  Psychiatric:        Speech: Speech normal.        Behavior: Behavior normal. Behavior is cooperative.      Assessment and Plan:    Beckie was seen today for diabetes, hypertension, hypothyroidism and hyperlipidemia.  Diagnoses and all orders for this visit:  Controlled type 2 diabetes mellitus without complication, without long-term current use of insulin ([place]) Update HgbA1c and provide  recommendations accordingly. She likes rybelsus 3 mg and would like to stay on this dose. -     Hemoglobin A1c  Primary hypertension Normotensive. Continue Losartan-HCTZ 100-12.5 mg, Norvasc 5 mg -     Comprehensive metabolic panel  Acquired hypothyroidism Update TSH Adjust 50 mcg levothyroxine as indicated -     TSH  Hyperlipidemia, unspecified hyperlipidemia type Update lipid panel and adjust crestor 5 mg as indicated by labs. -     Lipid panel  Skin lesion Unclear etiology. Possible dermatitis? Trial kenalog ointment. Refer to dermatology in case this is unsuccessful for f/u.  Other orders -     rosuvastatin (CRESTOR) 5 MG tablet; Take 1 tablet (5 mg total) by mouth daily. -     triamcinolone ointment (KENALOG) 0.5 %; Use twice daily  CMA or LPN served as scribe during this visit. History, Physical, and Plan performed by medical provider. The above documentation has been reviewed and is accurate and complete.  Inda Coke, PA-C [place], [place] 07/26/2020  Follow-up: No follow-ups on file.

## 2020-07-26 NOTE — Patient Instructions (Signed)
It was great to see you!  Will send in all refills after blood work has returned.  Please bring by your recent labs for me to review.  I will put in dermatology referral, in the meantime use the rx steroid ointment that I have sent in on your rash.  Let's follow-up in 5-6 months, sooner if you have concerns.  If a referral was placed today, you will be contacted for an appointment. Please note that routine referrals can sometimes take up to 3-4 weeks to process. Please call our office if you haven't heard anything after this time frame.  Take care,  Inda Coke PA-C

## 2020-07-27 DIAGNOSIS — I972 Postmastectomy lymphedema syndrome: Secondary | ICD-10-CM | POA: Diagnosis not present

## 2020-07-27 NOTE — Telephone Encounter (Signed)
Pt is going to make an appt to discuss.

## 2020-07-28 ENCOUNTER — Encounter: Payer: Self-pay | Admitting: Physician Assistant

## 2020-07-28 ENCOUNTER — Ambulatory Visit: Payer: Medicare Other | Attending: Physician Assistant | Admitting: Rehabilitation

## 2020-07-28 ENCOUNTER — Telehealth: Payer: Self-pay | Admitting: *Deleted

## 2020-07-28 ENCOUNTER — Other Ambulatory Visit: Payer: Self-pay

## 2020-07-28 ENCOUNTER — Encounter: Payer: Self-pay | Admitting: Rehabilitation

## 2020-07-28 DIAGNOSIS — R293 Abnormal posture: Secondary | ICD-10-CM | POA: Diagnosis not present

## 2020-07-28 DIAGNOSIS — I972 Postmastectomy lymphedema syndrome: Secondary | ICD-10-CM | POA: Diagnosis not present

## 2020-07-28 DIAGNOSIS — I890 Lymphedema, not elsewhere classified: Secondary | ICD-10-CM | POA: Diagnosis not present

## 2020-07-28 DIAGNOSIS — Z483 Aftercare following surgery for neoplasm: Secondary | ICD-10-CM | POA: Insufficient documentation

## 2020-07-28 LAB — RPR: RPR Screen: NONREACTIVE

## 2020-07-28 LAB — HEPATITIS B CORE ANTIBODY, TOTAL: HEPATITIS B CORE AB TOTAL (REFL): REACTIVE

## 2020-07-28 NOTE — Telephone Encounter (Signed)
Spoke to pt told her [facility] Bar reviewed of lab results you brought from [facility] and she said the Hep B core antibodies test results = prior Hep B infection no further work-up needed. Pt verbalized understanding.

## 2020-07-28 NOTE — Therapy (Signed)
[place], [place], [address] Phone: [phone]   Fax:  [phone]  Physical Therapy Treatment  Patient Details  Name: Amanda Rollins MRN: [id number] Date of Birth: 06-18-1950 Referring Provider (PT): Dr. Morene Rankins   Encounter Date: 07/28/2020   PT End of Session - 07/28/20 1023    Visit Number 14    Number of Visits 19    PT Start Time 1000    PT Stop Time 1020    PT Time Calculation (min) 20 min    Activity Tolerance Patient tolerated treatment well    Behavior During Therapy [facility] for tasks assessed/performed           Past Medical History:  Diagnosis Date  . Breast cancer ([place]) 2011  . Diabetes mellitus type 2, controlled ([place])   . Heart murmur   . Hyperlipidemia   . Hypertension   . Hypothyroidism     Past Surgical History:  Procedure Laterality Date  . ABDOMINAL HYSTERECTOMY  2000   Still has ovaries  . [place]  07/2019  . BREAST SURGERY    . CATARACT EXTRACTION  2019   Left eye 2019, Right eye 2020  . CESAREAN SECTION  12/08/1990  . MASTECTOMY  2011   Bilateral  . ROBOTIC ASSISTED LAPAROSCOPIC VAGINAL HYSTERECTOMY WITH FIBROID REMOVAL  1991    There were no vitals filed for this visit.   Subjective Assessment - 07/28/20 1001    Subjective I really like the pump.  garments working well.    Pertinent History Pt had bilateral mastectomies with ALND of approx 20 LN in December 2011. Pt. developed cellulitis on Dec 14, and just finished Keflex.   She has  had treatment for lymphedema 3 times in the past at Cancer center in [place], most recently in 2017.  Pt. wears a  sleeve during the day, and has done some MLD. Right shoulder ROM has been limited since fracturing her humerus in 2011, and left shoulder was hurting from a recent fall on stairs.    Currently in Pain? No/denies                 LYMPHEDEMA/ONCOLOGY QUESTIONNAIRE - 07/28/20 0001      Right Upper  Extremity Lymphedema   15 cm Proximal to Olecranon Process 36 cm    10 cm Proximal to Olecranon Process 33.4 cm    Olecranon Process 28.2 cm    15 cm Proximal to Ulnar Styloid Process 29.5 cm    10 cm Proximal to Ulnar Styloid Process 25.9 cm    Just Proximal to Ulnar Styloid Process 18.1 cm    Across Hand at [employer] 20 cm    At [place] of 2nd Digit 6.7 cm              Flowsheet Row Outpatient Rehab from [facility]  Lymphedema Life Impact Scale Total Score 30.88 %            [facility] Adult PT Treatment/Exercise - 07/28/20 0001      Manual Therapy   Manual therapy comments reviewed donning of sleeve with instruction on folding sleeve in half to not tug on the top as much and when to replace.    Edema Management Remeasured circumferences.  Gave pt instruction on self measuring and current measurements                       PT  Long Term Goals - 07/28/20 1024      PT LONG TERM GOAL #1   Title Pt weill be independent with compression bandaging    Status Achieved      PT LONG TERM GOAL #2   Title Pt will be independent with self MLD    Status Achieved      PT LONG TERM GOAL #3   Title Pt will have decreased edema at 10 cm prox to ulna and oolecranon by greater than 2.5 cm    Status Partially Met      PT LONG TERM GOAL #4   Title Pt will be understanding of precautions to prevent exacerbation of lymphedema/cellulits and will know warning signs    Status Achieved      PT LONG TERM GOAL #5   Title Pt will be fit for new compression garment for improved edema management    Status Achieved                 Plan - 07/28/20 1023    Clinical Impression Statement Pt returns for 1 month use of pump and garments check holding steady with measurements except for slight increase but not significant at the upper arm by 1cm.  Pt is enjoying the pump and knows how to control her lymphedema at this time.     Consulted and Agree with Plan of Care Patient           Patient will benefit from skilled therapeutic intervention in order to improve the following deficits and impairments:     Visit Diagnosis: Secondary lymphedema  Postmastectomy lymphedema syndrome  Aftercare following surgery for neoplasm  Abnormal posture     Problem List Patient Active Problem List   Diagnosis Date Noted  . Depression, major, single episode, mild ([place]) 04/05/2020  . Osteopenia 02/08/2020  . Lymphedema 02/08/2020  . Pulmonary nodules 02/08/2020  . Atherosclerosis of abdominal aorta ([place]) 02/08/2020  . Hypothyroidism   . Hypertension   . Hyperlipidemia   . Heart murmur   . Diabetes mellitus type 2, controlled ([place])   . Breast cancer [facility]) 2011    Stark Bray 07/28/2020, 10:25 AM  [place], [place], [address] Phone: [phone]   Fax:  [phone]  Name: Amanda Rollins MRN: [id number] Date of Birth: February 08, 1951  PHYSICAL THERAPY DISCHARGE SUMMARY  Visits from Start of Care: 14  Current functional level related to goals / functional outcomes: See above   Remaining deficits:chronic lymphedema, recurrent cellulitis   Education / Equipment: See above Plan: Patient agrees to discharge.  Patient goals were partially met. Patient is being discharged due to meeting the stated rehab goals.  ?????     Shan Levans, PT

## 2020-09-12 ENCOUNTER — Other Ambulatory Visit: Payer: Self-pay | Admitting: Physician Assistant

## 2020-11-30 ENCOUNTER — Ambulatory Visit: Payer: Medicare Other | Admitting: Physician Assistant

## 2020-12-17 ENCOUNTER — Other Ambulatory Visit: Payer: Self-pay | Admitting: Family Medicine

## 2021-01-15 ENCOUNTER — Ambulatory Visit: Payer: Medicare Other | Admitting: Physician Assistant

## 2021-01-17 NOTE — Progress Notes (Signed)
Amanda Rollins is a 70 y.o. female here for arthritis pain.  History of Present Illness:   Chief Complaint  Patient presents with   Joint Pain    Pt c/o joint pain in shoulders, bilateral knees and back. Has been going on for 1 year but has worsened in the past 2 months. She is taking Advil, Aleve and Tylenol with some relief.     HPI  Arthritis Amanda Rollins presents with c/o intermittent joint pain in shoulders, bilateral knees, and back. This pain has been ongoing for a year, worsening over the past two months,with only slight relief while using OTC medications. Amanda Rollins states that it affects her upper back the most, but due to a high pain tolerance she is able to push through. Amanda Rollins also expressed her hands going numb while sleeping, but reveals she keeps her hands bent at night.   Amanda Rollins has recently moved apartments and thinks that could've contributed to the pain worsening. She believes having a torn meniscus and receiving a Cortisone injection in her right leg two years ago which was helpful. She has found it difficult to walk on some days due to her pain and has stopped exercising altogether.   Diabetes Currently she has been taking rybelsus 3mg  tabs. She has found it very helpful to suppress her hunger and craving for sweets in the first three months. The only problems she has is the price of this medication. She has not been compliant with diet.   Hypertension She says she woke up late this morning and that could be a reason for the reading. She has been compliant with hyzaar 100-12.5 mg and norvasc 5 mg with no adverse effects.   BP Readings from Last 3 Encounters:  01/18/21 (!) 113/59  07/26/20 120/60  05/26/20 110/68   High Cholesterol She has been compliant with taking crestor 5mg  with no adverse effects. She denies believing it to be the cause of her joint pain due to it not being a familiar sensation she once felt while taking a previous medication.    Hypothyroidism She is currently on synthroid 50 mcg daily and tolerating well.   Past Medical History:  Diagnosis Date   Breast cancer ([place]) 2011   Diabetes mellitus type 2, controlled ([place])    Heart murmur    Hyperlipidemia    Hypertension    Hypothyroidism      Social History   Tobacco Use   Smoking status: Former    Types: Cigarettes    Start date: 02/07/1970   Smokeless tobacco: Never  Substance Use Topics   Alcohol use: Yes    Alcohol/week: 3.0 standard drinks    Types: 3 Glasses of wine per week   Drug use: Never    Past Surgical History:  Procedure Laterality Date   ABDOMINAL HYSTERECTOMY  2000   Still has ovaries   BELPHAROPTOSIS REPAIR  07/2019   BREAST SURGERY     CATARACT EXTRACTION  2019   Left eye 2019, Right eye 2020   CESAREAN SECTION  12/08/1990   MASTECTOMY  2011   Bilateral   ROBOTIC ASSISTED LAPAROSCOPIC VAGINAL HYSTERECTOMY WITH FIBROID REMOVAL  1991    Family History  Adopted: Yes  Problem Relation Age of Onset   Cancer Mother        small cell cancer of liver and lungs    Allergies  Allergen Reactions   Sulfa Antibiotics Rash   Metformin Diarrhea    Current Medications:   Current  Outpatient Medications:    amLODipine (NORVASC) 5 MG tablet, Take 1 tablet (5 mg total) by mouth daily., Disp: 90 tablet, Rfl: 3   calcium-vitamin D (OSCAL WITH D) 250-125 MG-UNIT tablet, Take 1 tablet by mouth daily., Disp: , Rfl:    denosumab (PROLIA) 60 MG/ML SOSY injection, Inject 60 mg into the skin every 6 (six) months., Disp: , Rfl:    fluticasone (FLONASE) 50 MCG/ACT nasal spray, SHAKE LIQUID AND USE 2 SPRAYS IN EACH NOSTRIL DAILY, Disp: 16 g, Rfl: 2   levothyroxine (SYNTHROID) 50 MCG tablet, Take 1 tablet (50 mcg total) by mouth daily., Disp: 90 tablet, Rfl: 1   losartan-hydrochlorothiazide (HYZAAR) 100-12.5 MG tablet, Take 1 tablet by mouth daily., Disp: 90 tablet, Rfl: 3   magnesium gluconate (MAGONATE) 500 MG tablet, Take 500 mg by mouth 2  (two) times daily., Disp: , Rfl:    rosuvastatin (CRESTOR) 5 MG tablet, Take 1 tablet (5 mg total) by mouth daily., Disp: 90 tablet, Rfl: 1   RYBELSUS 3 MG TABS, TAKE 1 TABLET BY MOUTH DAILY IN THE AFTERNOON, Disp: 30 tablet, Rfl: 2   triamcinolone ointment (KENALOG) 0.5 %, Use twice daily, Disp: 15 g, Rfl: 0   Review of Systems:   ROS Negative unless otherwise specified per HPI.  Vitals:   Vitals:   01/18/21 0833  BP: (!) 113/59  Pulse: 78  Temp: (!) 97.2 F (36.2 C)  TempSrc: Temporal  SpO2: 95%  Weight: 188 lb (85.3 kg)  Height: 5\' 5"  (1.651 m)     Body mass index is 31.28 kg/m.  Physical Exam:   Physical Exam Vitals and nursing note reviewed.  Constitutional:      General: She is not in acute distress.    Appearance: She is well-developed. She is not ill-appearing or toxic-appearing.  Cardiovascular:     Rate and Rhythm: Normal rate and regular rhythm.     Pulses: Normal pulses.     Heart sounds: Normal heart sounds, S1 normal and S2 normal.  Pulmonary:     Effort: Pulmonary effort is normal.     Breath sounds: Normal breath sounds.  Musculoskeletal:     Comments: Right knee lateral knee swelling appreciated. Normal ROM. No tenderness.   No bony tenderness along spine.   Skin:    General: Skin is warm and dry.  Neurological:     Mental Status: She is alert.     GCS: GCS eye subscore is 4. GCS verbal subscore is 5. GCS motor subscore is 6.  Psychiatric:        Speech: Speech normal.        Behavior: Behavior normal. Behavior is cooperative.    Assessment and Plan:   Controlled type 2 diabetes mellitus without complication, without long-term current use of insulin ([facility]) Update A1c today  Continue Rybelsus 3 mg daily until January Then we can increase Rebylsus to 7 mg daily after new year and out of donut hole Follow-up 3-6 months  Primary hypertension Normotensive Continue hyzaar 100-12.5 mg and norvasc 5 mg Follow-up in 3-6 months  Acquired  hypothyroidism Update blood work today and provide recommendations to current levothyroxine 50 mcg daily  Hyperlipidemia, unspecified hyperlipidemia type Update lipid panel and provide recommendations to crestor 5 mg daily We did briefly discuss holding crestor due to pain, but she declined today  Arthralgia No red flags Start diclofenac 75 mg BID Consider PT after returning from [place]  R knee pain Referral to sports med    [place]  C R[place],acting as a scribe for [employer], PA.,have documented all relevant documentation on the behalf of Inda Coke, PA,as directed by  Inda Coke, PA while in the presence of Inda Coke, [place].   I, Inda Coke, [place], have reviewed all documentation for this visit. The documentation on 01/18/21 for the exam, diagnosis, procedures, and orders are all accurate and complete.   Inda Coke, PA-C

## 2021-01-18 ENCOUNTER — Ambulatory Visit (INDEPENDENT_AMBULATORY_CARE_PROVIDER_SITE_OTHER): Payer: Medicare Other | Admitting: Physician Assistant

## 2021-01-18 ENCOUNTER — Other Ambulatory Visit: Payer: Self-pay

## 2021-01-18 ENCOUNTER — Encounter: Payer: Self-pay | Admitting: Physician Assistant

## 2021-01-18 VITALS — BP 113/59 | HR 78 | Temp 97.2°F | Ht 65.0 in | Wt 188.0 lb

## 2021-01-18 DIAGNOSIS — I10 Essential (primary) hypertension: Secondary | ICD-10-CM

## 2021-01-18 DIAGNOSIS — M25561 Pain in right knee: Secondary | ICD-10-CM

## 2021-01-18 DIAGNOSIS — E119 Type 2 diabetes mellitus without complications: Secondary | ICD-10-CM | POA: Diagnosis not present

## 2021-01-18 DIAGNOSIS — E039 Hypothyroidism, unspecified: Secondary | ICD-10-CM

## 2021-01-18 DIAGNOSIS — Z23 Encounter for immunization: Secondary | ICD-10-CM

## 2021-01-18 DIAGNOSIS — M2559 Pain in other specified joint: Secondary | ICD-10-CM

## 2021-01-18 DIAGNOSIS — E785 Hyperlipidemia, unspecified: Secondary | ICD-10-CM

## 2021-01-18 LAB — CBC WITH DIFFERENTIAL/PLATELET
Basophils Absolute: 0 10*3/uL (ref 0.0–0.1)
Basophils Relative: 0.7 % (ref 0.0–3.0)
Eosinophils Absolute: 0.1 10*3/uL (ref 0.0–0.7)
Eosinophils Relative: 2.1 % (ref 0.0–5.0)
HCT: 40.4 % (ref 36.0–46.0)
Hemoglobin: 13.3 g/dL (ref 12.0–15.0)
Lymphocytes Relative: 23.3 % (ref 12.0–46.0)
Lymphs Abs: 1.6 10*3/uL (ref 0.7–4.0)
MCHC: 32.9 g/dL (ref 30.0–36.0)
MCV: 86.7 fl (ref 78.0–100.0)
Monocytes Absolute: 0.7 10*3/uL (ref 0.1–1.0)
Monocytes Relative: 9.8 % (ref 3.0–12.0)
Neutro Abs: 4.3 10*3/uL (ref 1.4–7.7)
Neutrophils Relative %: 64.1 % (ref 43.0–77.0)
Platelets: 226 10*3/uL (ref 150.0–400.0)
RBC: 4.65 Mil/uL (ref 3.87–5.11)
RDW: 13.3 % (ref 11.5–15.5)
WBC: 6.7 10*3/uL (ref 4.0–10.5)

## 2021-01-18 LAB — COMPREHENSIVE METABOLIC PANEL
ALT: 15 U/L (ref 0–35)
AST: 17 U/L (ref 0–37)
Albumin: 4.3 g/dL (ref 3.5–5.2)
Alkaline Phosphatase: 34 U/L — ABNORMAL LOW (ref 39–117)
BUN: 13 mg/dL (ref 6–23)
CO2: 29 mEq/L (ref 19–32)
Calcium: 9.8 mg/dL (ref 8.4–10.5)
Chloride: 100 mEq/L (ref 96–112)
Creatinine, Ser: 0.68 mg/dL (ref 0.40–1.20)
GFR: 87.04 mL/min (ref >60.00)
Glucose, Bld: 143 mg/dL — ABNORMAL HIGH (ref 70–99)
Potassium: 4 mEq/L (ref 3.5–5.1)
Sodium: 137 mEq/L (ref 135–145)
Total Bilirubin: 0.5 mg/dL (ref 0.2–1.2)
Total Protein: 7.3 g/dL (ref 6.0–8.3)

## 2021-01-18 LAB — LIPID PANEL
Cholesterol: 165 mg/dL (ref 0–200)
HDL: 57.2 mg/dL (ref >39.00)
LDL Cholesterol: 82 mg/dL (ref 0–99)
NonHDL: 108.06
Total CHOL/HDL Ratio: 3
Triglycerides: 131 mg/dL (ref 0.0–149.0)
VLDL: 26.2 mg/dL (ref 0.0–40.0)

## 2021-01-18 LAB — TSH: TSH: 3.19 u[IU]/mL (ref 0.35–5.50)

## 2021-01-18 LAB — HEMOGLOBIN A1C: Hgb A1c MFr Bld: 7.7 % — ABNORMAL HIGH (ref 4.6–6.5)

## 2021-01-18 MED ORDER — RYBELSUS 3 MG PO TABS: 1.0000 | ORAL_TABLET | Freq: Every day | ORAL | 0 refills | Status: DC

## 2021-01-18 NOTE — Addendum Note (Signed)
Addended by: Marian Sorrow on: 01/18/2021 12:28 PM   Modules accepted: Orders

## 2021-01-18 NOTE — Patient Instructions (Signed)
It was great to see you!  For your diabetes Update A1c today  Continue Rybelsus 3 mg daily until January Then we can increase Rebylsus to 7 mg daily  For your arthritis: Start diclofenac twice daily  For your knee: Sports medicine referral placed, they will call you  We will update your blood work today.  Let's follow-up in 3-6 months, sooner if you have concerns.  If a referral was placed today, you will be contacted for an appointment. Please note that routine referrals can sometimes take up to 3-4 weeks to process. Please call our office if you haven't heard anything after this time frame.  Take care,  Inda Coke PA-C

## 2021-01-19 ENCOUNTER — Other Ambulatory Visit: Payer: Self-pay | Admitting: Physician Assistant

## 2021-01-19 MED ORDER — RYBELSUS 7 MG PO TABS: 7.0000 mg | ORAL_TABLET | Freq: Every day | ORAL | 2 refills | Status: DC

## 2021-01-19 NOTE — Progress Notes (Signed)
I, Peterson Lombard, LAT, ATC acting as a scribe for Lynne Leader, MD.  Subjective:    CC: R knee pain  HPI: Pt is a 70 y/o female c/o R knee pain ongoing for 1 year, but worsening over the last 2 months. Pt locates pain to the anterior aspect of R knee. Pt notes a hx of a meniscal tear in her R knee and had prior steroid injection (by provider in [facility]), providing almost a year of relief.  R knee swelling: yes Mechanical symptoms: yes Aggravates: standing, walking Treatments tried: IBU, naproxen, Tylenol, prior steroid injection  Additionally she notes some left forearm pain and left thumb pain.  Forearm pain is located just distal to the lateral malleolus and the lateral forearm.  Thumb pain is located at the base of the left thumb.  Both are worse with using her thumb a lot on the phone.  Pertinent review of Systems: No fevers or chills  Relevant historical information: Breast cancer history of mastectomy right side with right-sided lymphedema.   Objective:    Vitals:   01/22/21 0836  BP: (!) 168/98  Pulse: 86  SpO2: 97%   General: Well Developed, well nourished, and in no acute distress.   MSK:  Right knee normal-appearing without significant effusion.  Normal motion with crepitation. Tender palpation lateral joint line. Stable ligamentous exam.  Left forearm tender palpation approximately 3 cm distal to lateral malleolus.  Nontender at lateral malleolus. Normal elbow motion.  Normal elbow strength. No pain with resisted wrist extension.  Pain with resisted pronation and supination.  Left wrist and thumb tender palpation base of the thumb.  Thumb and wrist motion intact.  Lab and Radiology Results  Procedure: Real-time Ultrasound Guided Injection of right knee superior lateral patellar space Device: Philips Affiniti 50G Images permanently stored and available for review in PACS Ultrasound evaluation prior to injection reveals no significant joint effusion.   Slightly narrowed medial joint line.  No Baker's cyst. Verbal informed consent obtained.  Discussed risks and benefits of procedure. Warned about infection bleeding damage to structures skin hypopigmentation and fat atrophy among others. Patient expresses understanding and agreement Time-out conducted.   Noted no overlying erythema, induration, or other signs of local infection.   Skin prepped in a sterile fashion.   Local anesthesia: Topical Ethyl chloride.   With sterile technique and under real time ultrasound guidance: 40 mg of Kenalog and 2 mL of lidocaine injected into knee joint. Fluid seen entering the joint capsule.   Completed without difficulty   Pain immediately resolved suggesting accurate placement of the medication.   Advised to call if fevers/chills, erythema, induration, drainage, or persistent bleeding.   Images permanently stored and available for review in the ultrasound unit.  Impression: Technically successful ultrasound guided injection.    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  No acute fractures.  No aggressive appearing bony lesions. Await formal radiology review   Impression and Recommendations:    Assessment and Plan: 70 y.o. female with right knee pain thought primarily due to degenerative changes.  Plan for steroid injection and Voltaren gel.  Recheck in about 6 weeks.  Left forearm pain and left thumb pain.  Forearm pain thought possibly be due to pronator tendinopathy..  Thumb pain due to first [place] DJD.  Patient is excellent candidate for hand PT.  Plan for referral to hand PT.  Also recommend Voltaren gel.  Recheck in 6 weeks.  PDMP not reviewed this encounter. Orders Placed This Encounter  Procedures   [place] LIMITED JOINT SPACE STRUCTURES LOW RIGHT(NO LINKED CHARGES)    Standing Status:   Future    Number of Occurrences:   1    Standing Expiration Date:   07/23/2021     Order Specific Question:   Reason for Exam (SYMPTOM  OR DIAGNOSIS REQUIRED)    Answer:   right knee pain    Order Specific Question:   Preferred imaging location?    Answer:   [facility]   DG Knee AP/LAT W/[facility] Right    Standing Status:   Future    Number of Occurrences:   1    Standing Expiration Date:   01/22/2022    Order Specific Question:   Reason for Exam (SYMPTOM  OR DIAGNOSIS REQUIRED)    Answer:   right knee pain    Order Specific Question:   Preferred imaging location?    Answer:   [facility]   Ambulatory referral to Physical Therapy    Referral Priority:   Routine    Referral Type:   Physical Medicine    Referral Reason:   Specialty Services Required    Requested Specialty:   Physical Therapy    Number of Visits Requested:   1   No orders of the defined types were placed in this encounter.   Discussed warning signs or symptoms. Please see discharge instructions. Patient expresses understanding.   The above documentation has been reviewed and is accurate and complete Lynne Leader, M.D.

## 2021-01-22 ENCOUNTER — Other Ambulatory Visit: Payer: Self-pay

## 2021-01-22 ENCOUNTER — Ambulatory Visit: Payer: Self-pay

## 2021-01-22 ENCOUNTER — Ambulatory Visit (INDEPENDENT_AMBULATORY_CARE_PROVIDER_SITE_OTHER): Payer: Medicare Other

## 2021-01-22 ENCOUNTER — Ambulatory Visit: Payer: Medicare Other | Admitting: Family Medicine

## 2021-01-22 VITALS — BP 168/98 | HR 86 | Ht 65.0 in | Wt 191.8 lb

## 2021-01-22 DIAGNOSIS — M25561 Pain in right knee: Secondary | ICD-10-CM

## 2021-01-22 DIAGNOSIS — M79632 Pain in left forearm: Secondary | ICD-10-CM | POA: Diagnosis not present

## 2021-01-22 DIAGNOSIS — G8929 Other chronic pain: Secondary | ICD-10-CM

## 2021-01-22 DIAGNOSIS — M79645 Pain in left finger(s): Secondary | ICD-10-CM | POA: Diagnosis not present

## 2021-01-22 NOTE — Patient Instructions (Addendum)
Thank you for coming in today.   Please get an Xray today before you leave   I've referred you to Physical Therapy.  Let us know if you don't hear from them in one week.   Please use Voltaren gel (Generic Diclofenac Gel) up to 4x daily for pain as needed.  This is available over-the-counter as both the name brand Voltaren gel and the generic diclofenac gel.   Recheck in about 6 weeks.

## 2021-01-23 NOTE — Progress Notes (Signed)
Right knee x-ray shows mild arthritis changes

## 2021-01-23 NOTE — Progress Notes (Signed)
Amanda Rollins is a 70 y.o. female here for cellulitis.  History of Present Illness:   Chief Complaint  Patient presents with   Cellulitis    Right forearm. Redness and heat appeared yesterday     HPI  Cellulitis  Amanda Rollins presents with c/o constant redness and swelling in her right forearm, spreading to her shoulder -- started yesterday. Amanda Rollins has experienced cellulitis in this arm in the past that was treated successfully with keflex. Denies fever, chills, nausea, vomiting, inciting event.  Past Medical History:  Diagnosis Date   Breast cancer ([place]) 2011   Diabetes mellitus type 2, controlled ([place])    Heart murmur    Hyperlipidemia    Hypertension    Hypothyroidism      Social History   Tobacco Use   Smoking status: Former    Types: Cigarettes    Start date: 02/07/1970   Smokeless tobacco: Never  Substance Use Topics   Alcohol use: Yes    Alcohol/week: 3.0 standard drinks    Types: 3 Glasses of wine per week   Drug use: Never    Past Surgical History:  Procedure Laterality Date   ABDOMINAL HYSTERECTOMY  2000   Still has ovaries   BELPHAROPTOSIS REPAIR  07/2019   BREAST SURGERY     CATARACT EXTRACTION  2019   Left eye 2019, Right eye 2020   CESAREAN SECTION  12/08/1990   MASTECTOMY  2011   Bilateral   ROBOTIC ASSISTED LAPAROSCOPIC VAGINAL HYSTERECTOMY WITH FIBROID REMOVAL  1991    Family History  Adopted: Yes  Problem Relation Age of Onset   Cancer Mother        small cell cancer of liver and lungs    Allergies  Allergen Reactions   Sulfa Antibiotics Rash   Metformin Diarrhea    Current Medications:   Current Outpatient Medications:    amLODipine (NORVASC) 5 MG tablet, Take 1 tablet (5 mg total) by mouth daily., Disp: 90 tablet, Rfl: 3   calcium-vitamin D (OSCAL WITH D) 250-125 MG-UNIT tablet, Take 1 tablet by mouth daily., Disp: , Rfl:    denosumab (PROLIA) 60 MG/ML SOSY injection, Inject 60 mg into the skin every 6 (six) months., Disp: ,  Rfl:    fluticasone (FLONASE) 50 MCG/ACT nasal spray, SHAKE LIQUID AND USE 2 SPRAYS IN EACH NOSTRIL DAILY, Disp: 16 g, Rfl: 2   levothyroxine (SYNTHROID) 50 MCG tablet, Take 1 tablet (50 mcg total) by mouth daily., Disp: 90 tablet, Rfl: 1   losartan-hydrochlorothiazide (HYZAAR) 100-12.5 MG tablet, Take 1 tablet by mouth daily., Disp: 90 tablet, Rfl: 3   magnesium gluconate (MAGONATE) 500 MG tablet, Take 500 mg by mouth 2 (two) times daily., Disp: , Rfl:    rosuvastatin (CRESTOR) 5 MG tablet, Take 1 tablet (5 mg total) by mouth daily., Disp: 90 tablet, Rfl: 1   Semaglutide (RYBELSUS) 7 MG TABS, Take 7 mg by mouth daily., Disp: 30 tablet, Rfl: 2   triamcinolone ointment (KENALOG) 0.5 %, Use twice daily, Disp: 15 g, Rfl: 0   Review of Systems:   ROS Negative unless otherwise specified per HPI.  Vitals:   Vitals:   01/24/21 1127  BP: 118/74  Pulse: 64  Temp: 97.6 F (36.4 C)  TempSrc: Temporal  SpO2: 97%  Weight: 188 lb (85.3 kg)     Body mass index is 31.28 kg/m.  Physical Exam:   Physical Exam Vitals and nursing note reviewed.  Constitutional:      General: She  is not in acute distress.    Appearance: She is well-developed. She is not ill-appearing or toxic-appearing.  Cardiovascular:     Rate and Rhythm: Normal rate and regular rhythm.     Pulses: Normal pulses.     Heart sounds: Normal heart sounds, S1 normal and S2 normal.  Pulmonary:     Effort: Pulmonary effort is normal.     Breath sounds: Normal breath sounds.  Skin:    General: Skin is warm and dry.     Findings: Erythema (Diffuse along right arm from hand to shoulder) present.  Neurological:     Mental Status: She is alert.     GCS: GCS eye subscore is 4. GCS verbal subscore is 5. GCS motor subscore is 6.  Psychiatric:        Speech: Speech normal.        Behavior: Behavior normal. Behavior is cooperative.    Assessment and Plan:   Cellulitis of right upper extremity Start oral keflex 500 mg QID x 7  days CBC today Worsening precautions advised Follow-up with me Monday for recheck  I,Havlyn C Ratchford,acting as a scribe for [employer], PA.,have documented all relevant documentation on the behalf of Inda Coke, PA,as directed by  Inda Coke, PA while in the presence of Inda Coke, [place].  I, Inda Coke, [place], have reviewed all documentation for this visit. The documentation on 01/24/21 for the exam, diagnosis, procedures, and orders are all accurate and complete.   Inda Coke, PA-C

## 2021-01-24 ENCOUNTER — Ambulatory Visit (INDEPENDENT_AMBULATORY_CARE_PROVIDER_SITE_OTHER): Payer: Medicare Other | Admitting: Physician Assistant

## 2021-01-24 ENCOUNTER — Other Ambulatory Visit: Payer: Self-pay

## 2021-01-24 ENCOUNTER — Encounter: Payer: Self-pay | Admitting: Physician Assistant

## 2021-01-24 VITALS — BP 118/74 | HR 64 | Temp 97.6°F | Wt 188.0 lb

## 2021-01-24 DIAGNOSIS — L03113 Cellulitis of right upper limb: Secondary | ICD-10-CM

## 2021-01-24 LAB — CBC WITH DIFFERENTIAL/PLATELET
Basophils Absolute: 0.1 10*3/uL (ref 0.0–0.1)
Basophils Relative: 0.7 % (ref 0.0–3.0)
Eosinophils Absolute: 0.1 10*3/uL (ref 0.0–0.7)
Eosinophils Relative: 0.8 % (ref 0.0–5.0)
HCT: 40.2 % (ref 36.0–46.0)
Hemoglobin: 13.4 g/dL (ref 12.0–15.0)
Lymphocytes Relative: 19.7 % (ref 12.0–46.0)
Lymphs Abs: 2 10*3/uL (ref 0.7–4.0)
MCHC: 33.4 g/dL (ref 30.0–36.0)
MCV: 86 fl (ref 78.0–100.0)
Monocytes Absolute: 1 10*3/uL (ref 0.1–1.0)
Monocytes Relative: 9.4 % (ref 3.0–12.0)
Neutro Abs: 7.2 10*3/uL (ref 1.4–7.7)
Neutrophils Relative %: 69.4 % (ref 43.0–77.0)
Platelets: 251 10*3/uL (ref 150.0–400.0)
RBC: 4.67 Mil/uL (ref 3.87–5.11)
RDW: 13.3 % (ref 11.5–15.5)
WBC: 10.4 10*3/uL (ref 4.0–10.5)

## 2021-01-24 MED ORDER — CEPHALEXIN 500 MG PO CAPS: 500.0000 mg | ORAL_CAPSULE | Freq: Four times a day (QID) | ORAL | 0 refills | Status: DC

## 2021-01-24 NOTE — Patient Instructions (Signed)
It was great to see you!  Start keflex 4 times a day  Follow-up with me on Monday, sooner if concerns  Take care,  Inda Coke PA-C

## 2021-01-29 ENCOUNTER — Encounter: Payer: Self-pay | Admitting: Physician Assistant

## 2021-01-29 ENCOUNTER — Ambulatory Visit (INDEPENDENT_AMBULATORY_CARE_PROVIDER_SITE_OTHER): Payer: Medicare Other | Admitting: Physician Assistant

## 2021-01-29 ENCOUNTER — Other Ambulatory Visit: Payer: Self-pay

## 2021-01-29 VITALS — BP 110/68 | HR 73 | Temp 97.7°F | Ht 65.0 in | Wt 188.5 lb

## 2021-01-29 DIAGNOSIS — L03113 Cellulitis of right upper limb: Secondary | ICD-10-CM

## 2021-01-29 DIAGNOSIS — M25561 Pain in right knee: Secondary | ICD-10-CM | POA: Diagnosis not present

## 2021-01-29 NOTE — Progress Notes (Signed)
Amanda Rollins is a 70 y.o. female is here for follow up.  I acted as a Education administrator for [employer], PA-C [employer], LPN    History of Present Illness:   Chief Complaint  Patient presents with   Cellulitis    HPI  Cellulitis Pt here for f/u of cellulitis right upper extremity was seen on 10/5 and started on Keflex. Redness and swelling is resolving on right extremity. Pt is taking antibiotic as directed. Denies fever, chills, nausea, further redness, discharge, swelling.  Right knee pain She saw Dr. Georgina Snell on October 3 and had a knee injection.  She states that she did not have relief with this injection.  She is wondering if she should have physical therapy.  She is starting to have right hip pain as well.  Denies any new knee pain symptoms since last seeing me.  Health Maintenance Due  Topic Date Due   OPHTHALMOLOGY EXAM  Never done   COVID-19 Vaccine (4 - Booster for Moderna series) 07/04/2020    Past Medical History:  Diagnosis Date   Breast cancer ([place]) 2011   Diabetes mellitus type 2, controlled ([place])    Heart murmur    Hyperlipidemia    Hypertension    Hypothyroidism      Social History   Tobacco Use   Smoking status: Former    Types: Cigarettes    Start date: 02/07/1970   Smokeless tobacco: Never  Substance Use Topics   Alcohol use: Yes    Alcohol/week: 3.0 standard drinks    Types: 3 Glasses of wine per week   Drug use: Never    Past Surgical History:  Procedure Laterality Date   ABDOMINAL HYSTERECTOMY  2000   Still has ovaries   BELPHAROPTOSIS REPAIR  07/2019   BREAST SURGERY     CATARACT EXTRACTION  2019   Left eye 2019, Right eye 2020   CESAREAN SECTION  12/08/1990   MASTECTOMY  2011   Bilateral   ROBOTIC ASSISTED LAPAROSCOPIC VAGINAL HYSTERECTOMY WITH FIBROID REMOVAL  1991    Family History  Adopted: Yes  Problem Relation Age of Onset   Cancer Mother        small cell cancer of liver and lungs    PMHx, SurgHx, SocialHx,  FamHx, Medications, and Allergies were reviewed in the Visit Navigator and updated as appropriate.   Patient Active Problem List   Diagnosis Date Noted   Depression, major, single episode, mild ([place]) 04/05/2020   Osteopenia 02/08/2020   Lymphedema 02/08/2020   Pulmonary nodules 02/08/2020   Atherosclerosis of abdominal aorta ([place]) 02/08/2020   Hypothyroidism    Hypertension    Hyperlipidemia    Heart murmur    Diabetes mellitus type 2, controlled ([place])    Breast cancer ([place]) 2011    Social History   Tobacco Use   Smoking status: Former    Types: Cigarettes    Start date: 02/07/1970   Smokeless tobacco: Never  Substance Use Topics   Alcohol use: Yes    Alcohol/week: 3.0 standard drinks    Types: 3 Glasses of wine per week   Drug use: Never    Current Medications and Allergies:    Current Outpatient Medications:    amLODipine (NORVASC) 5 MG tablet, Take 1 tablet (5 mg total) by mouth daily., Disp: 90 tablet, Rfl: 3   calcium-vitamin D (OSCAL WITH D) 250-125 MG-UNIT tablet, Take 1 tablet by mouth daily., Disp: , Rfl:    cephALEXin (KEFLEX) 500 MG  capsule, Take 1 capsule (500 mg total) by mouth 4 (four) times daily., Disp: 28 capsule, Rfl: 0   denosumab (PROLIA) 60 MG/ML SOSY injection, Inject 60 mg into the skin every 6 (six) months., Disp: , Rfl:    fluticasone (FLONASE) 50 MCG/ACT nasal spray, SHAKE LIQUID AND USE 2 SPRAYS IN EACH NOSTRIL DAILY, Disp: 16 g, Rfl: 2   levothyroxine (SYNTHROID) 50 MCG tablet, Take 1 tablet (50 mcg total) by mouth daily., Disp: 90 tablet, Rfl: 1   losartan-hydrochlorothiazide (HYZAAR) 100-12.5 MG tablet, Take 1 tablet by mouth daily., Disp: 90 tablet, Rfl: 3   magnesium gluconate (MAGONATE) 500 MG tablet, Take 500 mg by mouth 2 (two) times daily., Disp: , Rfl:    rosuvastatin (CRESTOR) 5 MG tablet, Take 1 tablet (5 mg total) by mouth daily., Disp: 90 tablet, Rfl: 1   Semaglutide (RYBELSUS) 7 MG TABS, Take 7 mg by mouth daily., Disp: 30 tablet,  Rfl: 2   triamcinolone ointment (KENALOG) 0.5 %, Use twice daily, Disp: 15 g, Rfl: 0   Allergies  Allergen Reactions   Sulfa Antibiotics Rash   Metformin Diarrhea    Review of Systems   ROS Negative unless otherwise specified per HPI.  Vitals:   Vitals:   01/29/21 0901  BP: 110/68  Pulse: 73  Temp: 97.7 F (36.5 C)  TempSrc: Temporal  SpO2: 97%  Weight: 188 lb 8 oz (85.5 kg)  Height: 5\' 5"  (1.651 m)     Body mass index is 31.37 kg/m.   Physical Exam:    Physical Exam Vitals and nursing note reviewed.  Constitutional:      General: She is not in acute distress.    Appearance: She is well-developed. She is not ill-appearing or toxic-appearing.  Cardiovascular:     Rate and Rhythm: Normal rate and regular rhythm.     Pulses: Normal pulses.     Heart sounds: Normal heart sounds, S1 normal and S2 normal.  Pulmonary:     Effort: Pulmonary effort is normal.     Breath sounds: Normal breath sounds.  Skin:    General: Skin is warm and dry.     Comments: Erythema essentially resolved from last visit  Neurological:     Mental Status: She is alert.     GCS: GCS eye subscore is 4. GCS verbal subscore is 5. GCS motor subscore is 6.  Psychiatric:        Speech: Speech normal.        Behavior: Behavior normal. Behavior is cooperative.     Assessment and Plan:   Cellulitis of right upper extremity Improving I have asked patient to finish out her Keflex 500 mg 4 times daily Follow-up with [place] if any concerns  Acute pain of right knee - Plan: Ambulatory referral to Physical Therapy Referral to Lyndee Hensen at our office Follow-up as needed with Dr. Georgina Snell as well  CMA or LPN served as scribe during this visit. History, Physical, and Plan performed by medical provider. The above documentation has been reviewed and is accurate and complete.   Inda Coke, PA-C [place], [place] 01/29/2021  Follow-up: No follow-ups on file.

## 2021-01-29 NOTE — Patient Instructions (Addendum)
It was great to see you!  Please schedule an appointment with Lauren on your way out.  Glad your rash is improving, keep me posted if that changes.  Take care,  Inda Coke PA-C

## 2021-02-16 ENCOUNTER — Telehealth: Payer: Self-pay | Admitting: Family Medicine

## 2021-02-16 NOTE — Telephone Encounter (Signed)
Received medical records from [facility] orthopedic in [place]  Patient had an intra-articular steroid injection on April 27, 2018 to her right knee for presumed arthritis  X-ray right knee January 2020 interpretation shows lateral tilt of the patella otherwise normal.

## 2021-02-28 ENCOUNTER — Ambulatory Visit (INDEPENDENT_AMBULATORY_CARE_PROVIDER_SITE_OTHER): Payer: Medicare Other | Admitting: Physical Therapy

## 2021-02-28 ENCOUNTER — Encounter: Payer: Self-pay | Admitting: Physical Therapy

## 2021-02-28 ENCOUNTER — Other Ambulatory Visit: Payer: Self-pay

## 2021-02-28 DIAGNOSIS — G8929 Other chronic pain: Secondary | ICD-10-CM

## 2021-02-28 DIAGNOSIS — M25561 Pain in right knee: Secondary | ICD-10-CM

## 2021-02-28 NOTE — Patient Instructions (Signed)
Access Code: [email or web address] URL: [email or web address] Date: 02/28/2021 Prepared by: Lyndee Hensen  Exercises Supine Quadricep Sets - 2 x daily - 1-2 sets - 10 reps Straight Leg Raise - 1 x daily - 1-2 sets - 10 reps Sidelying Hip Abduction - 1 x daily - 1-2 sets - 10 reps Seated Knee Extension AROM - 1 x daily - 1-2 sets - 10 reps

## 2021-02-28 NOTE — Therapy (Signed)
[place] [address] [place], [place], [address] Phone: [phone]   Fax:  [phone]  Physical Therapy Evaluation  Patient Details  Name: Amanda Rollins MRN: [id number] Date of Birth: [birth year] Referring Provider (PT): Inda Coke   Encounter Date: 02/28/2021   PT End of Session - 02/28/21 0900     Visit Number 1    Number of Visits 12    Date for PT Re-Evaluation 04/25/21    Authorization Type BCBS Medicare    PT Start Time 0845    PT Stop Time 0928    PT Time Calculation (min) 43 min    Activity Tolerance Patient tolerated treatment well    Behavior During Therapy [facility] for tasks assessed/performed             Past Medical History:  Diagnosis Date   Breast cancer ([place]) 2011   Diabetes mellitus type 2, controlled ([place])    Heart murmur    Hyperlipidemia    Hypertension    Hypothyroidism     Past Surgical History:  Procedure Laterality Date   ABDOMINAL HYSTERECTOMY  2000   Still has ovaries   BELPHAROPTOSIS REPAIR  07/2019   BREAST SURGERY     CATARACT EXTRACTION  2019   Left eye 2019, Right eye 2020   CESAREAN SECTION  12/08/1990   MASTECTOMY  2011   Bilateral   ROBOTIC ASSISTED LAPAROSCOPIC VAGINAL HYSTERECTOMY WITH FIBROID REMOVAL  1991    There were no vitals filed for this visit.    Subjective Assessment - 02/28/21 0853     Subjective Ongoing pain in R knee. Just had injection at beginning of October., also had one in 2020. Feels mild relief from this shot. She Is retired. Just started [employer], online. Has gym membership at [employer]. Likes to walk, 1-2 miles, knee is usually sore.    Pertinent History previous breast CA 2011, DM,    Limitations Lifting;Standing;Walking;House hold activities    Patient Stated Goals decreased pain    Currently in Pain? Yes    Pain Score 4     Pain Location Knee    Pain Orientation Right    Pain Descriptors / Indicators Aching    Pain Type Chronic  pain    Pain Onset More than a month ago    Pain Frequency Intermittent    Aggravating Factors  increased standing, walking                [facility] PT Assessment - 02/28/21 0001       Assessment   Medical Diagnosis R knee pain    Referring Provider (PT) Inda Coke    Prior Therapy no      Precautions   Precautions None      Balance Screen   Has the patient fallen in the past 6 months No      Prior Function   Level of Independence Independent      Cognition   Overall Cognitive Status Within Functional Limits for tasks assessed      ROM / Strength   AROM / PROM / Strength AROM;Strength      AROM   Overall AROM Comments Knee ROM: WNL, Hip ROM: WNL bil      Strength   Overall Strength Comments R Knee flex: 4+/5, ext: 4/5,  BIl hips: 4-/5 .      Palpation   Palpation comment Tenderness at lateral knee musculature, in mid and distal ITB, distal lateral quad,  Ambulation/Gait   Gait Comments Antalgic gait, increased lateral sway.                      Flowsheet Row Outpatient Rehab from 04/11/2020 in [place]  Lymphedema Life Impact Scale Total Score 30.88 %       Objective measurements completed on examination: See above findings.       [facility] Adult PT Treatment/Exercise - 02/28/21 0001       Exercises   Exercises Knee/Hip      Knee/Hip Exercises: Seated   Long Arc Quad 10 reps;Right      Knee/Hip Exercises: Supine   Quad Sets 15 reps    Straight Leg Raises 10 reps;Right      Knee/Hip Exercises: Sidelying   Hip ABduction 10 reps;Right                     PT Education - 02/28/21 0933     Education Details PT POC, Exam findings, HEP    Person(s) Educated Patient    Methods Explanation;Demonstration;Tactile cues;Verbal cues;Handout    Comprehension Verbalized understanding;Returned demonstration;Need further instruction;Verbal cues required;Tactile cues required               PT Short Term Goals - 02/28/21 0930       PT SHORT TERM GOAL #1   Title Pt to be independent with initial HEP    Time 2    Period Weeks    Status New    Target Date 03/14/21               PT Long Term Goals - 02/28/21 0931       PT LONG TERM GOAL #1   Title Pt to be independent with final HEP    Time 8    Period Weeks    Status New    Target Date 04/25/21      PT LONG TERM GOAL #2   Title Pt to report decreased pain in R knee, to 0-2/10 with standing activity and IADLS .    Time 8    Period Weeks    Status New    Target Date 04/25/21      PT LONG TERM GOAL #3   Title Pt to report ability for walking and/or exercise activity up to 1 hr without pain greater than 3/10.    Time 8    Period Weeks    Status New    Target Date 04/25/21      PT LONG TERM GOAL #4   Title Pt to demo improved hip strength to be at least 4+/5 bilaterally , for improved stability and gait pattern.    Time 8    Period Weeks    Status New    Target Date 04/25/21                    Plan - 02/28/21 8882     Clinical Impression Statement Pt presents with primary complaint of increased pain in R knee, consistent with OA. She has good ROM, but does have strength deficits in quad and hips. She has noted gait deficits, with increased lateral trunk sway, and likley compensation due to ongoing pain in R knee. SHe has increased pain and decreased tolerance for standing and walking activity. She has gym membership, but has lack of effective HEP for her Knee pain. Pt also with poor shoulder/thoracic spine posture, will benefit from education on HEP for  scapular strengthening and posture. Pt to benefit from skilled PT to improve deficits and pain.    Personal Factors and Comorbidities Time since onset of injury/illness/exacerbation    Examination-Activity Limitations Stand;Locomotion Level;Bend;Squat;Stairs    Examination-Participation Restrictions Cleaning;Community Activity;Shop     Stability/Clinical Decision Making Stable/Uncomplicated    Clinical Decision Making Low    Rehab Potential Good    PT Frequency 2x / week    PT Duration 8 weeks    PT Treatment/Interventions ADLs/Self Care Home Management;Cryotherapy;Electrical Stimulation;Iontophoresis 4mg /ml Dexamethasone;Moist Heat;Ultrasound;DME Instruction;Balance training;Therapeutic exercise;Therapeutic activities;Functional mobility training;Gait training;Stair training;Neuromuscular re-education;Patient/family education;Orthotic Fit/Training;Passive range of motion;Dry needling;Taping;Vasopneumatic Device;Joint Manipulations;Spinal Manipulations    PT Home Exercise Plan [id number]    Consulted and Agree with Plan of Care Patient             Patient will benefit from skilled therapeutic intervention in order to improve the following deficits and impairments:  Abnormal gait, Pain, Improper body mechanics, Increased muscle spasms, Decreased mobility, Decreased activity tolerance, Decreased strength  Visit Diagnosis: Chronic pain of right knee     Problem List Patient Active Problem List   Diagnosis Date Noted   Depression, major, single episode, mild ([place]) 04/05/2020   Osteopenia 02/08/2020   Lymphedema 02/08/2020   Pulmonary nodules 02/08/2020   Atherosclerosis of abdominal aorta ([place]) 02/08/2020   Hypothyroidism    Hypertension    Hyperlipidemia    Heart murmur    Diabetes mellitus type 2, controlled ([place])    Breast cancer ([place]) 2011   Lyndee Hensen, PT, DPT 9:48 AM  02/28/21    [facility] [place] [address] [place], [place], [address] Phone: [phone]   Fax:  [phone]  Name: Amanda Rollins MRN: [id number] Date of Birth: [birth year]

## 2021-03-01 NOTE — Progress Notes (Signed)
   I, Wendy Poet, LAT, ATC, am serving as scribe for Dr. Lynne Leader.  Amanda Rollins is a 70 y.o. female who presents to [place] [facility] today for f/u of R knee pain and new R arm pain due to cellulitis.  She was last seen by Dr. Georgina Snell on 01/22/21 and had a R knee steroid injection.  She was referred to PT and has completed one session.  Since her last visit w/ Dr. Georgina Snell, the pt was seen by her PCP on 01/24/21 and 01/29/21 for R forearm/UE cellulitis and was prescribed Keflex.  Today, pt reports that she was doing great until yesterday when she started having pain in her R superior-lateral knee.  She will have her 2nd PT session later this afternoon.   She thinks physical therapy will be helpful.  Diagnostic testing: R knee XR- 01/22/21  Pertinent review of systems: No fevers or chills  Relevant historical information: Diabetes.  History of breast cancer with right arm lymphedema.   Exam:  BP 140/78 (BP Location: Left Arm, Patient Position: Sitting, Cuff Size: Normal)   Pulse 80   Ht 5\' 5"  (1.651 m)   Wt 188 lb 9.6 oz (85.5 kg)   SpO2 98%   BMI 31.38 kg/m  General: Well Developed, well nourished, and in no acute distress.   MSK: Right knee normal-appearing normal motion with crepitation. Normal gait   Lab and Radiology Results EXAM: RIGHT KNEE 3 VIEWS   COMPARISON:  None.   FINDINGS: There is no acute fracture or dislocation. The bones are osteopenic. There is mild arthritic changes of the knee with tricompartmental narrowing. No significant joint effusion. The soft tissues are unremarkable.   IMPRESSION: 1. No acute fracture or dislocation. 2. Mild arthritic changes of the knee.     Electronically Signed   By: Anner Crete M.D.   On: 01/22/2021 14:46 I, Lynne Leader, personally (independently) visualized and performed the interpretation of the images attached in this note.      Assessment and Plan: 70 y.o. female with right knee  pain thought to be due to exacerbation of DJD.  Patient did reasonably well with a steroid injection and is now starting physical therapy which should also be helpful.  We will continue PT but also will authorize hyaluronic acid injections for use if not improved.  Check back as needed.  Discussed treatment plan and options of this potential next steps. Total encounter time 20 minutes including face-to-face time with the patient and, reviewing past medical record, and charting on the date of service.       Discussed warning signs or symptoms. Please see discharge instructions. Patient expresses understanding.   The above documentation has been reviewed and is accurate and complete Lynne Leader, M.D.

## 2021-03-05 ENCOUNTER — Encounter: Payer: Self-pay | Admitting: Physical Therapy

## 2021-03-05 ENCOUNTER — Encounter: Payer: Self-pay | Admitting: Family Medicine

## 2021-03-05 ENCOUNTER — Other Ambulatory Visit: Payer: Self-pay

## 2021-03-05 ENCOUNTER — Ambulatory Visit: Payer: Medicare Other | Admitting: Family Medicine

## 2021-03-05 ENCOUNTER — Ambulatory Visit: Payer: Medicare Other | Admitting: Physical Therapy

## 2021-03-05 VITALS — BP 140/78 | HR 80 | Ht 65.0 in | Wt 188.6 lb

## 2021-03-05 DIAGNOSIS — M25561 Pain in right knee: Secondary | ICD-10-CM

## 2021-03-05 DIAGNOSIS — M1711 Unilateral primary osteoarthritis, right knee: Secondary | ICD-10-CM

## 2021-03-05 DIAGNOSIS — G8929 Other chronic pain: Secondary | ICD-10-CM

## 2021-03-05 NOTE — Therapy (Signed)
[facility] [address] [place], [place], [address] Phone: [phone]   Fax:  [phone]  Physical Therapy Treatment  Patient Details  Name: Amanda Rollins MRN: [id number] Date of Birth: [birth year] Referring Provider (PT): Inda Coke   Encounter Date: 03/05/2021   PT End of Session - 03/05/21 1609     Visit Number 2    Number of Visits 12    Date for PT Re-Evaluation 04/25/21    Authorization Type BCBS Medicare    PT Start Time 1600    PT Stop Time 6967    PT Time Calculation (min) 42 min    Activity Tolerance Patient tolerated treatment well    Behavior During Therapy [facility] for tasks assessed/performed             Past Medical History:  Diagnosis Date   Breast cancer ([place]) 2011   Diabetes mellitus type 2, controlled ([place])    Heart murmur    Hyperlipidemia    Hypertension    Hypothyroidism     Past Surgical History:  Procedure Laterality Date   ABDOMINAL HYSTERECTOMY  2000   Still has ovaries   BELPHAROPTOSIS REPAIR  07/2019   BREAST SURGERY     CATARACT EXTRACTION  2019   Left eye 2019, Right eye 2020   CESAREAN SECTION  12/08/1990   MASTECTOMY  2011   Bilateral   ROBOTIC ASSISTED LAPAROSCOPIC VAGINAL HYSTERECTOMY WITH FIBROID REMOVAL  1991    There were no vitals filed for this visit.   Subjective Assessment - 03/05/21 1608     Subjective Pt states she was doing well, but did have increased pain yesterday, some better today, after she was moving tables and chairs at church.    Patient Stated Goals decreased pain    Currently in Pain? Yes    Pain Score 2     Pain Location Knee    Pain Orientation Right    Pain Descriptors / Indicators Aching    Pain Type Chronic pain    Pain Onset More than a month ago    Pain Frequency Intermittent    Aggravating Factors  increased standing, walking.                       Flowsheet Row Outpatient Rehab from 04/11/2020 in [facility]patient [facility]  Lymphedema Life Impact Scale Total Score 30.88 %             [facility] Adult PT Treatment/Exercise - 03/05/21 0001       Knee/Hip Exercises: Stretches   Active Hamstring Stretch 3 reps;30 seconds    Active Hamstring Stretch Limitations seated      Knee/Hip Exercises: Aerobic   Recumbent Bike L 2 x 6 min;      Knee/Hip Exercises: Standing   Heel Raises 15 reps    Hip Flexion 15 reps;Knee bent    Forward Step Up Step Height: 6";Hand Hold: 1;10 reps;Both    Other Standing Knee Exercises Stride stance weight shifts x 15 bil, no UE support, (pre-gait)    Other Standing Knee Exercises Ambulation 35 ft x 8 wtih education on posture, heel strike on R,shorter stride length on R, and decreasing lateral trunk sway.      Knee/Hip Exercises: Seated   Long Arc Quad 20 reps    Long Arc Quad Weight 3 lbs.      Knee/Hip Exercises: Supine   Quad Sets 15 reps    Straight Leg  Raises Right;15 reps      Knee/Hip Exercises: Sidelying   Hip ABduction 15 reps;Right      Manual Therapy   Manual Therapy Joint mobilization;Soft tissue mobilization;Passive ROM    Soft tissue mobilization STM/IASTM to R lateral quad and knee.                       PT Short Term Goals - 02/28/21 0930       PT SHORT TERM GOAL #1   Title Pt to be independent with initial HEP    Time 2    Period Weeks    Status New    Target Date 03/14/21               PT Long Term Goals - 02/28/21 0931       PT LONG TERM GOAL #1   Title Pt to be independent with final HEP    Time 8    Period Weeks    Status New    Target Date 04/25/21      PT LONG TERM GOAL #2   Title Pt to report decreased pain in R knee, to 0-2/10 with standing activity and IADLS .    Time 8    Period Weeks    Status New    Target Date 04/25/21      PT LONG TERM GOAL #3   Title Pt to report ability for walking and/or exercise activity up to 1 hr without pain greater than 3/10.    Time 8     Period Weeks    Status New    Target Date 04/25/21      PT LONG TERM GOAL #4   Title Pt to demo improved hip strength to be at least 4+/5 bilaterally , for improved stability and gait pattern.    Time 8    Period Weeks    Status New    Target Date 04/25/21                   Plan - 03/05/21 1648     Clinical Impression Statement Pt with good ability for ther ex today. Minimal pain in knee with activity. She does have tenderness in lateral knee, quad and distal ITB. She is challenged with stride stance weight shifts for pre-gait exercise.Cued with ambulation for shorter stride length, increased heel strike on R, and decreasing lateral trunk sway. Pt with some improvement of mechanics with concentration and cueing. Pt to benefit form progressive strenthening for hip and knee.    Personal Factors and Comorbidities Time since onset of injury/illness/exacerbation    Examination-Activity Limitations Stand;Locomotion Level;Bend;Squat;Stairs    Examination-Participation Restrictions Cleaning;Community Activity;Shop    Stability/Clinical Decision Making Stable/Uncomplicated    Rehab Potential Good    PT Frequency 2x / week    PT Duration 8 weeks    PT Treatment/Interventions ADLs/Self Care Home Management;Cryotherapy;Electrical Stimulation;Iontophoresis 4mg /ml Dexamethasone;Moist Heat;Ultrasound;DME Instruction;Balance training;Therapeutic exercise;Therapeutic activities;Functional mobility training;Gait training;Stair training;Neuromuscular re-education;Patient/family education;Orthotic Fit/Training;Passive range of motion;Dry needling;Taping;Vasopneumatic Device;Joint Manipulations;Spinal Manipulations    PT Home Exercise Plan [id number]    Consulted and Agree with Plan of Care Patient             Patient will benefit from skilled therapeutic intervention in order to improve the following deficits and impairments:  Abnormal gait, Pain, Improper body mechanics, Increased muscle spasms,  Decreased mobility, Decreased activity tolerance, Decreased strength  Visit Diagnosis: Chronic pain of right knee     Problem  List Patient Active Problem List   Diagnosis Date Noted   Depression, major, single episode, mild ([place]) 04/05/2020   Osteopenia 02/08/2020   Lymphedema 02/08/2020   Pulmonary nodules 02/08/2020   Atherosclerosis of abdominal aorta ([place]) 02/08/2020   Hypothyroidism    Hypertension    Hyperlipidemia    Heart murmur    Diabetes mellitus type 2, controlled ([place])    Breast cancer ([place]) 2011    Lyndee Hensen, PT, DPT 4:50 PM  03/05/21    [facility] [place], [place], [address] Phone: [phone]   Fax:  [phone]  Name: Amanda Rollins MRN: [id number] Date of Birth: [birth year]

## 2021-03-05 NOTE — Patient Instructions (Addendum)
Good to see you today.  Will authorize the gel shots and do those if needed.  Con't PT.  Follow-up: as needed

## 2021-03-08 ENCOUNTER — Other Ambulatory Visit: Payer: Self-pay

## 2021-03-08 ENCOUNTER — Encounter: Payer: Self-pay | Admitting: Physical Therapy

## 2021-03-08 ENCOUNTER — Ambulatory Visit: Payer: Medicare Other | Admitting: Physical Therapy

## 2021-03-08 DIAGNOSIS — M25561 Pain in right knee: Secondary | ICD-10-CM | POA: Diagnosis not present

## 2021-03-08 DIAGNOSIS — G8929 Other chronic pain: Secondary | ICD-10-CM | POA: Diagnosis not present

## 2021-03-08 NOTE — Therapy (Signed)
[facility] [address] [place], [place], [address] Phone: [phone]   Fax:  [phone]  Physical Therapy Treatment  Patient Details  Name: Stephinie Battisti MRN: [id number] Date of Birth: April 06, 1951 Referring Provider (PT): Inda Coke   Encounter Date: 03/08/2021   PT End of Session - 03/08/21 1124     Visit Number 3    Number of Visits 12    Date for PT Re-Evaluation 04/25/21    Authorization Type BCBS Medicare    PT Start Time 0932    PT Stop Time 3976    PT Time Calculation (min) 42 min    Activity Tolerance Patient tolerated treatment well    Behavior During Therapy [facility] for tasks assessed/performed             Past Medical History:  Diagnosis Date   Breast cancer ([place]) 2011   Diabetes mellitus type 2, controlled ([place])    Heart murmur    Hyperlipidemia    Hypertension    Hypothyroidism     Past Surgical History:  Procedure Laterality Date   ABDOMINAL HYSTERECTOMY  2000   Still has ovaries   BELPHAROPTOSIS REPAIR  07/2019   BREAST SURGERY     CATARACT EXTRACTION  2019   Left eye 2019, Right eye 2020   CESAREAN SECTION  12/08/1990   MASTECTOMY  2011   Bilateral   ROBOTIC ASSISTED LAPAROSCOPIC VAGINAL HYSTERECTOMY WITH FIBROID REMOVAL  1991    There were no vitals filed for this visit.   Subjective Assessment - 03/08/21 1121     Subjective Pt states no pain today. Doing well with HEP.    Patient Stated Goals decreased pain    Currently in Pain? No/denies                       Flowsheet Row Outpatient Rehab from 04/11/2020 in [facility]  Lymphedema Life Impact Scale Total Score 30.88 %             [facility] Adult PT Treatment/Exercise - 03/08/21 0001       Knee/Hip Exercises: Stretches   Active Hamstring Stretch 3 reps;30 seconds    Active Hamstring Stretch Limitations seated      Knee/Hip Exercises: Aerobic   Recumbent Bike L 2 x 6  min;      Knee/Hip Exercises: Standing   Heel Raises --    Hip Flexion 15 reps;Knee bent    Hip Abduction 15 reps;Both    Forward Step Up Step Height: 6";Hand Hold: 1;10 reps;Both    Other Standing Knee Exercises Walk/march 10 ft x 4;    Other Standing Knee Exercises Ambulation 35 ft x 8 quick pace, with cueing for decreaseing trunk sway      Knee/Hip Exercises: Seated   Long Arc Quad 20 reps    Long Arc Quad Weight 4 lbs.      Knee/Hip Exercises: Supine   Straight Leg Raises Right;15 reps      Knee/Hip Exercises: Sidelying   Hip ABduction 15 reps;Both      Manual Therapy   Manual Therapy Joint mobilization;Soft tissue mobilization;Passive ROM    Soft tissue mobilization --                     PT Education - 03/08/21 1124     Education Details updated/reviewed HEP    Person(s) Educated Patient    Methods Explanation;Demonstration;Tactile cues;Verbal cues;Handout    Comprehension  Verbalized understanding;Returned demonstration;Verbal cues required;Tactile cues required;Need further instruction              PT Short Term Goals - 03/08/21 1125       PT SHORT TERM GOAL #1   Title Pt to be independent with initial HEP    Time 2    Period Weeks    Status Achieved    Target Date 03/14/21               PT Long Term Goals - 02/28/21 0931       PT LONG TERM GOAL #1   Title Pt to be independent with final HEP    Time 8    Period Weeks    Status New    Target Date 04/25/21      PT LONG TERM GOAL #2   Title Pt to report decreased pain in R knee, to 0-2/10 with standing activity and IADLS .    Time 8    Period Weeks    Status New    Target Date 04/25/21      PT LONG TERM GOAL #3   Title Pt to report ability for walking and/or exercise activity up to 1 hr without pain greater than 3/10.    Time 8    Period Weeks    Status New    Target Date 04/25/21      PT LONG TERM GOAL #4   Title Pt to demo improved hip strength to be at least 4+/5  bilaterally , for improved stability and gait pattern.    Time 8    Period Weeks    Status New    Target Date 04/25/21                   Plan - 03/08/21 1126     Clinical Impression Statement Pt doing very well with ther ex, progressed hip and knee strength today. She is challenged with and fatigues easily with hip abd. More noted weakness and fatigue on L vs R. Pt to benefit from continued work on this for imroving tolerance for standing/walking, and gait mechanics.    Personal Factors and Comorbidities Time since onset of injury/illness/exacerbation    Examination-Activity Limitations Stand;Locomotion Level;Bend;Squat;Stairs    Examination-Participation Restrictions Cleaning;Community Activity;Shop    Stability/Clinical Decision Making Stable/Uncomplicated    Rehab Potential Good    PT Frequency 2x / week    PT Duration 8 weeks    PT Treatment/Interventions ADLs/Self Care Home Management;Cryotherapy;Electrical Stimulation;Iontophoresis 4mg /ml Dexamethasone;Moist Heat;Ultrasound;DME Instruction;Balance training;Therapeutic exercise;Therapeutic activities;Functional mobility training;Gait training;Stair training;Neuromuscular re-education;Patient/family education;Orthotic Fit/Training;Passive range of motion;Dry needling;Taping;Vasopneumatic Device;Joint Manipulations;Spinal Manipulations    PT Home Exercise Plan [id number]    Consulted and Agree with Plan of Care Patient             Patient will benefit from skilled therapeutic intervention in order to improve the following deficits and impairments:  Abnormal gait, Pain, Improper body mechanics, Increased muscle spasms, Decreased mobility, Decreased activity tolerance, Decreased strength  Visit Diagnosis: Chronic pain of right knee     Problem List Patient Active Problem List   Diagnosis Date Noted   Depression, major, single episode, mild ([place]) 04/05/2020   Osteopenia 02/08/2020   Lymphedema 02/08/2020   Pulmonary  nodules 02/08/2020   Atherosclerosis of abdominal aorta ([place]) 02/08/2020   Hypothyroidism    Hypertension    Hyperlipidemia    Heart murmur    Diabetes mellitus type 2, controlled ([place])    Breast cancer (  [place]) 2011    Lyndee Hensen, PT, DPT 11:28 AM  03/08/21    [facility] [place], [place], [address] Phone: [phone]   Fax:  [phone]  Name: Quanta Robertshaw MRN: [id number] Date of Birth: 1950/06/02

## 2021-03-19 ENCOUNTER — Encounter: Payer: Self-pay | Admitting: Physical Therapy

## 2021-03-19 ENCOUNTER — Ambulatory Visit: Payer: Medicare Other | Admitting: Physical Therapy

## 2021-03-19 ENCOUNTER — Other Ambulatory Visit: Payer: Self-pay

## 2021-03-19 DIAGNOSIS — G8929 Other chronic pain: Secondary | ICD-10-CM | POA: Diagnosis not present

## 2021-03-19 DIAGNOSIS — M25561 Pain in right knee: Secondary | ICD-10-CM | POA: Diagnosis not present

## 2021-03-19 NOTE — Therapy (Signed)
[facility] [address] [place], [place], [address] Phone: [phone]   Fax:  [phone]  Physical Therapy Treatment  Patient Details  Name: Amanda Rollins MRN: [id number] Date of Birth: 09/07/1950 Referring Provider (PT): Inda Coke   Encounter Date: 03/19/2021   PT End of Session - 03/19/21 0949     Visit Number 4    Number of Visits 12    Date for PT Re-Evaluation 04/25/21    Authorization Type BCBS Medicare    PT Start Time 8921    PT Stop Time 0931    PT Time Calculation (min) 38 min    Activity Tolerance Patient tolerated treatment well    Behavior During Therapy [facility] for tasks assessed/performed             Past Medical History:  Diagnosis Date   Breast cancer ([place]) 2011   Diabetes mellitus type 2, controlled ([place])    Heart murmur    Hyperlipidemia    Hypertension    Hypothyroidism     Past Surgical History:  Procedure Laterality Date   ABDOMINAL HYSTERECTOMY  2000   Still has ovaries   BELPHAROPTOSIS REPAIR  07/2019   BREAST SURGERY     CATARACT EXTRACTION  2019   Left eye 2019, Right eye 2020   CESAREAN SECTION  12/08/1990   MASTECTOMY  2011   Bilateral   ROBOTIC ASSISTED LAPAROSCOPIC VAGINAL HYSTERECTOMY WITH FIBROID REMOVAL  1991    There were no vitals filed for this visit.   Subjective Assessment - 03/19/21 0859     Subjective Pt states increased pain in knee, thinks she twisted it at home. She was also away last week, and did lots of stairs. Increased pain in lateral and anterior knee.    Patient Stated Goals decreased pain    Currently in Pain? Yes    Pain Score 8     Pain Location Knee    Pain Orientation Right    Pain Type Chronic pain    Pain Onset More than a month ago    Pain Frequency Intermittent    Aggravating Factors  sitting this week has increased pain, up to 8-910.                       Flowsheet Row Outpatient Rehab from 04/11/2020 in [facility]  Lymphedema Life Impact Scale Total Score 30.88 %             [facility] Adult PT Treatment/Exercise - 03/19/21 0001       Knee/Hip Exercises: Stretches   Active Hamstring Stretch 3 reps;30 seconds    Active Hamstring Stretch Limitations seated      Knee/Hip Exercises: Aerobic   Recumbent Bike L 1 x 5 min;      Knee/Hip Exercises: Standing   Hip Flexion 15 reps;Knee bent    Hip Abduction --    Forward Step Up --    Other Standing Knee Exercises --    Other Standing Knee Exercises --      Knee/Hip Exercises: Seated   Long Arc Quad 20 reps;Both    Long Arc Quad Weight --    Heel Slides 20 reps;Right      Knee/Hip Exercises: Supine   Straight Leg Raises Right;2 sets;10 reps;Both      Knee/Hip Exercises: Sidelying   Hip ABduction --      Modalities   Modalities Iontophoresis      Iontophoresis  Type of Iontophoresis Dexamethasone    Location R distal/lateral quad    Time 4 hr patch      Manual Therapy   Manual Therapy Joint mobilization;Soft tissue mobilization;Passive ROM    Soft tissue mobilization STM/IASTM to R lateral quad and knee.    Passive ROM for flex and ext;                     PT Education - 03/19/21 0949     Education Details Discussed HEP, icing, pain relief for knee.    Person(s) Educated Patient    Methods Explanation;Demonstration;Verbal cues;Handout    Comprehension Verbalized understanding;Returned demonstration;Verbal cues required;Need further instruction              PT Short Term Goals - 03/08/21 1125       PT SHORT TERM GOAL #1   Title Pt to be independent with initial HEP    Time 2    Period Weeks    Status Achieved    Target Date 03/14/21               PT Long Term Goals - 02/28/21 0931       PT LONG TERM GOAL #1   Title Pt to be independent with final HEP    Time 8    Period Weeks    Status New    Target Date 04/25/21      PT LONG TERM GOAL #2   Title Pt to  report decreased pain in R knee, to 0-2/10 with standing activity and IADLS .    Time 8    Period Weeks    Status New    Target Date 04/25/21      PT LONG TERM GOAL #3   Title Pt to report ability for walking and/or exercise activity up to 1 hr without pain greater than 3/10.    Time 8    Period Weeks    Status New    Target Date 04/25/21      PT LONG TERM GOAL #4   Title Pt to demo improved hip strength to be at least 4+/5 bilaterally , for improved stability and gait pattern.    Time 8    Period Weeks    Status New    Target Date 04/25/21                   Plan - 03/19/21 0950     Clinical Impression Statement Pt with increased soreness in lateral knee, distal quad today. Improved soreness and mobility at end of session today. Discussed continuing HEP for strengthening, but limiting time spent on feet for a couple days to see if knee pain calms down. Pt likely over did activity and stairs being away last week. She has noted difficulty and weakness with SLR today, will benefit from continued strengthening. IASTM and ionto done for pain today.    Personal Factors and Comorbidities Time since onset of injury/illness/exacerbation    Examination-Activity Limitations Stand;Locomotion Level;Bend;Squat;Stairs    Examination-Participation Restrictions Cleaning;Community Activity;Shop    Stability/Clinical Decision Making Stable/Uncomplicated    Rehab Potential Good    PT Frequency 2x / week    PT Duration 8 weeks    PT Treatment/Interventions ADLs/Self Care Home Management;Cryotherapy;Electrical Stimulation;Iontophoresis 4mg /ml Dexamethasone;Moist Heat;Ultrasound;DME Instruction;Balance training;Therapeutic exercise;Therapeutic activities;Functional mobility training;Gait training;Stair training;Neuromuscular re-education;Patient/family education;Orthotic Fit/Training;Passive range of motion;Dry needling;Taping;Vasopneumatic Device;Joint Manipulations;Spinal Manipulations    PT  Home Exercise Plan [id number]    [id number] and Agree  with Plan of Care Patient             Patient will benefit from skilled therapeutic intervention in order to improve the following deficits and impairments:  Abnormal gait, Pain, Improper body mechanics, Increased muscle spasms, Decreased mobility, Decreased activity tolerance, Decreased strength  Visit Diagnosis: Chronic pain of right knee     Problem List Patient Active Problem List   Diagnosis Date Noted   Depression, major, single episode, mild ([place]) 04/05/2020   Osteopenia 02/08/2020   Lymphedema 02/08/2020   Pulmonary nodules 02/08/2020   Atherosclerosis of abdominal aorta ([place]) 02/08/2020   Hypothyroidism    Hypertension    Hyperlipidemia    Heart murmur    Diabetes mellitus type 2, controlled ([place])    Breast cancer ([place]) 2011   Lyndee Hensen, PT, DPT 9:55 AM  03/19/21    [facility] [address] [place], [place], [address] Phone: [phone]   Fax:  [phone]  Name: Amanda Rollins MRN: [id number] Date of Birth: 04-10-1951

## 2021-03-21 ENCOUNTER — Encounter: Payer: Medicare Other | Admitting: Physical Therapy

## 2021-03-26 ENCOUNTER — Other Ambulatory Visit: Payer: Self-pay

## 2021-03-26 ENCOUNTER — Ambulatory Visit: Payer: Medicare Other | Admitting: Physical Therapy

## 2021-03-26 ENCOUNTER — Encounter: Payer: Self-pay | Admitting: Physical Therapy

## 2021-03-26 DIAGNOSIS — M25561 Pain in right knee: Secondary | ICD-10-CM | POA: Diagnosis not present

## 2021-03-26 DIAGNOSIS — G8929 Other chronic pain: Secondary | ICD-10-CM

## 2021-03-26 NOTE — Therapy (Signed)
[facility] [address] [place], [place], [address] Phone: [phone]   Fax:  [phone]  Physical Therapy Treatment  Patient Details  Name: Amanda Rollins MRN: [id number] Date of Birth: April 29, 1950 Referring Provider (PT): Inda Coke   Encounter Date: 03/26/2021   PT End of Session - 03/26/21 1136     Visit Number 5    Number of Visits 12    Date for PT Re-Evaluation 04/25/21    Authorization Type BCBS Medicare    PT Start Time 0845    PT Stop Time 0924    PT Time Calculation (min) 39 min    Activity Tolerance Patient tolerated treatment well    Behavior During Therapy [facility] for tasks assessed/performed             Past Medical History:  Diagnosis Date   Breast cancer ([place]) 2011   Diabetes mellitus type 2, controlled ([place])    Heart murmur    Hyperlipidemia    Hypertension    Hypothyroidism     Past Surgical History:  Procedure Laterality Date   ABDOMINAL HYSTERECTOMY  2000   Still has ovaries   BELPHAROPTOSIS REPAIR  07/2019   BREAST SURGERY     CATARACT EXTRACTION  2019   Left eye 2019, Right eye 2020   CESAREAN SECTION  12/08/1990   MASTECTOMY  2011   Bilateral   ROBOTIC ASSISTED LAPAROSCOPIC VAGINAL HYSTERECTOMY WITH FIBROID REMOVAL  1991    There were no vitals filed for this visit.   Subjective Assessment - 03/26/21 1135     Subjective Pt states continued/increased pain in knee. She also feels she has increased soreness in hip/back, thinks her sciatic pain is increased.    Currently in Pain? Yes    Pain Score 8     Pain Location Knee    Pain Orientation Right    Pain Descriptors / Indicators Aching    Pain Type Chronic pain    Pain Onset More than a month ago    Pain Frequency Intermittent                                [facility] Adult PT Treatment/Exercise - 03/26/21 0001       Knee/Hip Exercises: Stretches   Active Hamstring Stretch 3 reps;30 seconds    Active  Hamstring Stretch Limitations seated    Other Knee/Hip Stretches SKTC 30 sec x 2 on R;  piriformis supine fig 4, 30 sec x 3 on R;      Knee/Hip Exercises: Aerobic   Recumbent Bike --      Knee/Hip Exercises: Standing   Hip Flexion --    Hip Abduction Both;20 reps    Lateral Step Up 10 reps;Right;Step Height: 4";Hand Hold: 1    Forward Step Up Hand Hold: 1;10 reps;Both;Step Height: 4"    Other Standing Knee Exercises Ambulation 35 ft x 6, bwd walking 10 ft x 4;      Knee/Hip Exercises: Seated   Long Arc Quad --    Heel Slides --      Knee/Hip Exercises: Supine   Straight Leg Raises Right;2 sets;10 reps      Knee/Hip Exercises: Sidelying   Hip ABduction 20 reps;Right      Modalities   Modalities Iontophoresis      Iontophoresis   Type of Iontophoresis --    Location --    Time 4 hr patch  Manual Therapy   Manual Therapy Joint mobilization;Soft tissue mobilization;Passive ROM;Manual Traction    Soft tissue mobilization STM/IASTM to R lateral quad and knee.    Passive ROM for flex and ext;    Manual Traction long leg distraction for R lumbar x 2 min;                       PT Short Term Goals - 03/08/21 1125       PT SHORT TERM GOAL #1   Title Pt to be independent with initial HEP    Time 2    Period Weeks    Status Achieved    Target Date 03/14/21               PT Long Term Goals - 02/28/21 0931       PT LONG TERM GOAL #1   Title Pt to be independent with final HEP    Time 8    Period Weeks    Status New    Target Date 04/25/21      PT LONG TERM GOAL #2   Title Pt to report decreased pain in R knee, to 0-2/10 with standing activity and IADLS .    Time 8    Period Weeks    Status New    Target Date 04/25/21      PT LONG TERM GOAL #3   Title Pt to report ability for walking and/or exercise activity up to 1 hr without pain greater than 3/10.    Time 8    Period Weeks    Status New    Target Date 04/25/21      PT LONG TERM GOAL  #4   Title Pt to demo improved hip strength to be at least 4+/5 bilaterally , for improved stability and gait pattern.    Time 8    Period Weeks    Status New    Target Date 04/25/21                   Plan - 03/26/21 1138     Clinical Impression Statement Pt with most soreness in lateral quad and  knee, increased pain with increased flexion. Reviewed stretching for low back and hip today for decreased pain here as well. Pt able to perform strengthening today, but knee more sore today with standing activity. Reviewed ther ex for HEP. Pt to benefit from continued care , she is still having considerable pain in R knee.    Personal Factors and Comorbidities Time since onset of injury/illness/exacerbation    Examination-Activity Limitations Stand;Locomotion Level;Bend;Squat;Stairs    Examination-Participation Restrictions Cleaning;Community Activity;Shop    Stability/Clinical Decision Making Stable/Uncomplicated    Rehab Potential Good    PT Frequency 2x / week    PT Duration 8 weeks    PT Treatment/Interventions ADLs/Self Care Home Management;Cryotherapy;Electrical Stimulation;Iontophoresis 4mg /ml Dexamethasone;Moist Heat;Ultrasound;DME Instruction;Balance training;Therapeutic exercise;Therapeutic activities;Functional mobility training;Gait training;Stair training;Neuromuscular re-education;Patient/family education;Orthotic Fit/Training;Passive range of motion;Dry needling;Taping;Vasopneumatic Device;Joint Manipulations;Spinal Manipulations    PT Home Exercise Plan [id number]    Consulted and Agree with Plan of Care Patient             Patient will benefit from skilled therapeutic intervention in order to improve the following deficits and impairments:  Abnormal gait, Pain, Improper body mechanics, Increased muscle spasms, Decreased mobility, Decreased activity tolerance, Decreased strength  Visit Diagnosis: Chronic pain of right knee     Problem List Patient Active Problem  List  Diagnosis Date Noted   Depression, major, single episode, mild ([place]) 04/05/2020   Osteopenia 02/08/2020   Lymphedema 02/08/2020   Pulmonary nodules 02/08/2020   Atherosclerosis of abdominal aorta ([place]) 02/08/2020   Hypothyroidism    Hypertension    Hyperlipidemia    Heart murmur    Diabetes mellitus type 2, controlled ([place])    Breast cancer ([place]) 2011    Lyndee Hensen, PT, DPT 11:44 AM  03/26/21   [facility] [address] [place], [place], [address] Phone: [phone]   Fax:  [phone]  Name: Amanda Rollins MRN: [id number] Date of Birth: Oct 21, 1950

## 2021-03-28 ENCOUNTER — Other Ambulatory Visit: Payer: Self-pay

## 2021-03-28 ENCOUNTER — Ambulatory Visit: Payer: Medicare Other | Admitting: Physical Therapy

## 2021-03-28 ENCOUNTER — Encounter: Payer: Self-pay | Admitting: Physical Therapy

## 2021-03-28 DIAGNOSIS — M25561 Pain in right knee: Secondary | ICD-10-CM | POA: Diagnosis not present

## 2021-03-28 DIAGNOSIS — G8929 Other chronic pain: Secondary | ICD-10-CM

## 2021-03-28 NOTE — Therapy (Signed)
[facility] [address] [place], [place], [address] Phone: [phone]   Fax:  [phone]  Physical Therapy Treatment  Patient Details  Name: Amanda Rollins MRN: [id number] Date of Birth: 14-May-1950 Referring Provider (PT): Inda Coke   Encounter Date: 03/28/2021   PT End of Session - 03/28/21 0959     Visit Number 6    Number of Visits 12    Date for PT Re-Evaluation 04/25/21    Authorization Type BCBS Medicare    PT Start Time 0848    PT Stop Time 0929    PT Time Calculation (min) 41 min    Activity Tolerance Patient tolerated treatment well    Behavior During Therapy [facility] for tasks assessed/performed             Past Medical History:  Diagnosis Date   Breast cancer ([place]) 2011   Diabetes mellitus type 2, controlled ([place])    Heart murmur    Hyperlipidemia    Hypertension    Hypothyroidism     Past Surgical History:  Procedure Laterality Date   ABDOMINAL HYSTERECTOMY  2000   Still has ovaries   BELPHAROPTOSIS REPAIR  07/2019   BREAST SURGERY     CATARACT EXTRACTION  2019   Left eye 2019, Right eye 2020   CESAREAN SECTION  12/08/1990   MASTECTOMY  2011   Bilateral   ROBOTIC ASSISTED LAPAROSCOPIC VAGINAL HYSTERECTOMY WITH FIBROID REMOVAL  1991    There were no vitals filed for this visit.   Subjective Assessment - 03/28/21 0946     Subjective Pt states continued/increased soreness in glute, R hip and down R leg, as well as knee pain.    Currently in Pain? Yes    Pain Score 8     Pain Location Knee    Pain Orientation Right    Pain Descriptors / Indicators Aching    Pain Type Chronic pain    Pain Onset More than a month ago    Pain Frequency Intermittent    Multiple Pain Sites Yes    Pain Score 8    Pain Location Hip    Pain Orientation Right    Pain Descriptors / Indicators Aching    Pain Type Acute pain    Pain Radiating Towards down R LE, into ITB and lower leg at times.    Pain Onset 1  to 4 weeks ago    Pain Frequency Intermittent                       Flowsheet Row Outpatient Rehab from 04/11/2020 in [facility]  Lymphedema Life Impact Scale Total Score 30.88 %             [facility] Adult PT Treatment/Exercise - 03/28/21 0001       Knee/Hip Exercises: Stretches   Active Hamstring Stretch 3 reps;30 seconds    Active Hamstring Stretch Limitations seated    Other Knee/Hip Stretches ITB supine with strap 30 sec x 3 on R;      Knee/Hip Exercises: Aerobic   Recumbent Bike L 1 x 5 min;      Knee/Hip Exercises: Sidelying   Hip ABduction 15 reps;Right    Clams x15 on R;      Modalities   Modalities Iontophoresis      Iontophoresis   Type of Iontophoresis Dexamethasone    Location R gr troch    Time 4 hr patch  Manual Therapy   Manual Therapy Joint mobilization    Joint Mobilization hip inf mobs on R, gr 3;    Soft tissue mobilization STM/DTM to R glute min, piriformis, Gr troch,  Roller to ITB    Passive ROM for flex and ext;    Manual Traction long leg distraction for R lumbar x 2 min;                     PT Education - 03/28/21 0959     Education Details reviewed HEP, and possible MD f/u in the next week or so if pain does not improve.    Person(s) Educated Patient    Methods Explanation;Demonstration;Verbal cues;Handout    Comprehension Verbalized understanding;Returned demonstration;Verbal cues required;Need further instruction              PT Short Term Goals - 03/08/21 1125       PT SHORT TERM GOAL #1   Title Pt to be independent with initial HEP    Time 2    Period Weeks    Status Achieved    Target Date 03/14/21               PT Long Term Goals - 02/28/21 0931       PT LONG TERM GOAL #1   Title Pt to be independent with final HEP    Time 8    Period Weeks    Status New    Target Date 04/25/21      PT LONG TERM GOAL #2   Title Pt to report decreased  pain in R knee, to 0-2/10 with standing activity and IADLS .    Time 8    Period Weeks    Status New    Target Date 04/25/21      PT LONG TERM GOAL #3   Title Pt to report ability for walking and/or exercise activity up to 1 hr without pain greater than 3/10.    Time 8    Period Weeks    Status New    Target Date 04/25/21      PT LONG TERM GOAL #4   Title Pt to demo improved hip strength to be at least 4+/5 bilaterally , for improved stability and gait pattern.    Time 8    Period Weeks    Status New    Target Date 04/25/21                   Plan - 03/28/21 1002     Clinical Impression Statement Pt with considerable pain today in glute, hip (gr troch) into ITB and knee. Focus on manual for tissue release in glute and hip today for decreasing pain. Continues to have pain in R knee, laterally, increased with meniscal testing today. Reviewed ther ex for back, hip and knee. Also discussed option for return to MD next week, if pain not calming down. Pt may have to decrease to 1x/wk next week due to co-pay.    Personal Factors and Comorbidities Time since onset of injury/illness/exacerbation    Examination-Activity Limitations Stand;Locomotion Level;Bend;Squat;Stairs    Examination-Participation Restrictions Cleaning;Community Activity;Shop    Stability/Clinical Decision Making Stable/Uncomplicated    Rehab Potential Good    PT Frequency 2x / week    PT Duration 8 weeks    PT Treatment/Interventions ADLs/Self Care Home Management;Cryotherapy;Electrical Stimulation;Iontophoresis 4mg /ml Dexamethasone;Moist Heat;Ultrasound;DME Instruction;Balance training;Therapeutic exercise;Therapeutic activities;Functional mobility training;Gait training;Stair training;Neuromuscular re-education;Patient/family education;Orthotic Fit/Training;Passive range of motion;Dry needling;Taping;Vasopneumatic Device;Joint  Manipulations;Spinal Manipulations    PT Home Exercise Plan [id number]    Consulted and  Agree with Plan of Care Patient             Patient will benefit from skilled therapeutic intervention in order to improve the following deficits and impairments:  Abnormal gait, Pain, Improper body mechanics, Increased muscle spasms, Decreased mobility, Decreased activity tolerance, Decreased strength  Visit Diagnosis: Chronic pain of right knee     Problem List Patient Active Problem List   Diagnosis Date Noted   Depression, major, single episode, mild ([place]) 04/05/2020   Osteopenia 02/08/2020   Lymphedema 02/08/2020   Pulmonary nodules 02/08/2020   Atherosclerosis of abdominal aorta ([place]) 02/08/2020   Hypothyroidism    Hypertension    Hyperlipidemia    Heart murmur    Diabetes mellitus type 2, controlled ([place])    Breast cancer ([place]) 2011   Lyndee Hensen, PT, DPT 10:09 AM  03/28/21    [facility] [address] [place], [place], [address] Phone: [phone]   Fax:  [phone]  Name: Olinda Nola MRN: [id number] Date of Birth: 12-Oct-1950

## 2021-04-02 ENCOUNTER — Ambulatory Visit: Payer: Medicare Other | Admitting: Physical Therapy

## 2021-04-02 ENCOUNTER — Encounter: Payer: Self-pay | Admitting: Physical Therapy

## 2021-04-02 ENCOUNTER — Other Ambulatory Visit: Payer: Self-pay

## 2021-04-02 DIAGNOSIS — M25561 Pain in right knee: Secondary | ICD-10-CM | POA: Diagnosis not present

## 2021-04-02 DIAGNOSIS — G8929 Other chronic pain: Secondary | ICD-10-CM

## 2021-04-02 NOTE — Therapy (Addendum)
[facility] [address] [place], [place], [address] Phone: [phone]   Fax:  [phone]  Physical Therapy Treatment  Patient Details  Name: Amanda Rollins MRN: [id number] Date of Birth: Aug 20, 1950 Referring Provider (PT): Inda Coke   Encounter Date: 04/02/2021   PT End of Session - 04/02/21 0856     Visit Number 7    Number of Visits 12    Date for PT Re-Evaluation 04/25/21    Authorization Type BCBS Medicare    PT Start Time [phone]    PT Stop Time 0930    PT Time Calculation (min) 43 min    Activity Tolerance Patient tolerated treatment well    Behavior During Therapy [facility] for tasks assessed/performed             Past Medical History:  Diagnosis Date   Breast cancer ([place]) 2011   Diabetes mellitus type 2, controlled ([place])    Heart murmur    Hyperlipidemia    Hypertension    Hypothyroidism     Past Surgical History:  Procedure Laterality Date   ABDOMINAL HYSTERECTOMY  2000   Still has ovaries   BELPHAROPTOSIS REPAIR  07/2019   BREAST SURGERY     CATARACT EXTRACTION  2019   Left eye 2019, Right eye 2020   CESAREAN SECTION  12/08/1990   MASTECTOMY  2011   Bilateral   ROBOTIC ASSISTED LAPAROSCOPIC VAGINAL HYSTERECTOMY WITH FIBROID REMOVAL  1991    There were no vitals filed for this visit.   Subjective Assessment - 04/02/21 0854     Subjective Pt states slightly less pain in R LE. Does have some more soreness in lower leg in the last couple days.    Patient Stated Goals decreased pain    Currently in Pain? Yes    Pain Score 5     Pain Location Knee    Pain Orientation Right    Pain Descriptors / Indicators Aching    Pain Type Chronic pain    Pain Onset More than a month ago    Pain Frequency Intermittent    Pain Score 5    Pain Location Hip    Pain Orientation Right    Pain Descriptors / Indicators Aching    Pain Type Acute pain    Pain Onset More than a month ago    Pain Frequency  Intermittent                       Flowsheet Row [facility]  Lymphedema Life Impact Scale Total Score 30.88 %             [facility] Adult PT Treatment/Exercise - 04/02/21 0001       Knee/Hip Exercises: Stretches   Active Hamstring Stretch 3 reps;30 seconds    Active Hamstring Stretch Limitations seated    Other Knee/Hip Stretches ITB supine with strap 30 sec x 3 on R;      Knee/Hip Exercises: Aerobic   Recumbent Bike L 1 x 5 min;      Knee/Hip Exercises: Standing   Hip Flexion 15 reps;Knee bent    Stairs up/down 5 steps with no HR, recipricol x 5;    Other Standing Knee Exercises Tandem stance with L/R head turns x 1 min bil;      Knee/Hip Exercises: Seated   Long Arc Quad 10 reps;Right    Heel Slides 10 reps  Knee/Hip Exercises: Supine   Straight Leg Raises 10 reps;2 sets;Right      Knee/Hip Exercises: Sidelying   Hip ABduction 15 reps;Right;Left    Clams --      Modalities   Modalities Iontophoresis      Iontophoresis   Type of Iontophoresis Dexamethasone    Location --    Time --      Manual Therapy   Manual Therapy Joint mobilization    Joint Mobilization hip inf mobs on R, gr 3;    Soft tissue mobilization STM/DTM to R post knee and hamstring    Passive ROM for flex and ext;    Manual Traction long leg distraction for R lumbar x 2 min;                       PT Short Term Goals - 03/08/21 1125       PT SHORT TERM GOAL #1   Title Pt to be independent with initial HEP    Time 2    Period Weeks    Status Achieved    Target Date 03/14/21               PT Long Term Goals - 02/28/21 0931       PT LONG TERM GOAL #1   Title Pt to be independent with final HEP    Time 8    Period Weeks    Status New    Target Date 04/25/21      PT LONG TERM GOAL #2   Title Pt to report decreased pain in R knee, to 0-2/10 with standing activity and IADLS .     Time 8    Period Weeks    Status New    Target Date 04/25/21      PT LONG TERM GOAL #3   Title Pt to report ability for walking and/or exercise activity up to 1 hr without pain greater than 3/10.    Time 8    Period Weeks    Status New    Target Date 04/25/21      PT LONG TERM GOAL #4   Title Pt to demo improved hip strength to be at least 4+/5 bilaterally , for improved stability and gait pattern.    Time 8    Period Weeks    Status New    Target Date 04/25/21                   Plan - 04/02/21 1012     Clinical Impression Statement Pt does have improved pain in hip and thigh and knee,  from last week, however still has quite a bit of pain in the knee with increased activity, weight bearing activity, and flexion ROM. She has difficulty today sustaining standing exercises, due to soreness in knee. She has more tenderness in posterior knee and hamstring today as well. She notes small cyst at back of R thigh that she thinks is causing increased pain with pressure (knee brace and sitting for too long). Area appears very small to be creaing that much pain, but recommended that she discuss with MD. She is going away/out of town for a week or so, discussed seeing MD for f/u if pain is not improving when she returns.    Personal Factors and Comorbidities Time since onset of injury/illness/exacerbation    Examination-Activity Limitations Stand;Locomotion Level;Bend;Squat;Stairs    Examination-Participation Restrictions Cleaning;Community Activity;Shop    Stability/Clinical Decision Making Stable/Uncomplicated  Rehab Potential Good    PT Frequency 2x / week    PT Duration 8 weeks    PT Treatment/Interventions ADLs/Self Care Home Management;Cryotherapy;Electrical Stimulation;Iontophoresis 91m/ml Dexamethasone;Moist Heat;Ultrasound;DME Instruction;Balance training;Therapeutic exercise;Therapeutic activities;Functional mobility training;Gait training;Stair training;Neuromuscular  re-education;Patient/family education;Orthotic Fit/Training;Passive range of motion;Dry needling;Taping;Vasopneumatic Device;Joint Manipulations;Spinal Manipulations    PT Home Exercise Plan [id number]   Consulted and Agree with Plan of Care Patient             Patient will benefit from skilled therapeutic intervention in order to improve the following deficits and impairments:  Abnormal gait, Pain, Improper body mechanics, Increased muscle spasms, Decreased mobility, Decreased activity tolerance, Decreased strength  Visit Diagnosis: Chronic pain of right knee     Problem List Patient Active Problem List   Diagnosis Date Noted   Depression, major, single episode, mild ([place] 04/05/2020   Osteopenia 02/08/2020   Lymphedema 02/08/2020   Pulmonary nodules 02/08/2020   Atherosclerosis of abdominal aorta ([place] 02/08/2020   Hypothyroidism    Hypertension    Hyperlipidemia    Heart murmur    Diabetes mellitus type 2, controlled ([place]    [place]ast [place] ([place] 2011   LLyndee Hensen PT, DPT 12:06 PM  04/02/21    [facility] [address]Phone: [phone]  Fax:  [phone] Name: PBritnay MagnussenMRN: [id number] of Birth: [birth year] PHYSICAL THERAPY DISCHARGE SUMMARY  Visits from Start of Care: 7   Patient agrees to discharge.  Patient goals were partially met. Patient is being discharged due to - not returning since last visit.    LLyndee Hensen PT, DPT 1:41 PM  06/04/21

## 2021-04-05 ENCOUNTER — Encounter: Payer: Medicare Other | Admitting: Physical Therapy

## 2021-05-03 ENCOUNTER — Telehealth: Payer: Self-pay

## 2021-05-03 NOTE — Telephone Encounter (Signed)
Call pt to see if there was a change to her insurance, due to having issues running it through [employer] and getting the error that the pt's member ID was not recognize. Pt was out of town when I call and said that she would either call us back with the updated information or send it via [place].

## 2021-05-10 ENCOUNTER — Encounter: Payer: Self-pay | Admitting: Physician Assistant

## 2021-05-10 ENCOUNTER — Other Ambulatory Visit: Payer: Self-pay

## 2021-05-10 ENCOUNTER — Telehealth: Payer: Self-pay

## 2021-05-10 ENCOUNTER — Ambulatory Visit (INDEPENDENT_AMBULATORY_CARE_PROVIDER_SITE_OTHER): Payer: Medicare Other | Admitting: Physician Assistant

## 2021-05-10 VITALS — BP 122/70 | HR 82 | Temp 98.0°F | Ht 64.0 in | Wt 195.0 lb

## 2021-05-10 DIAGNOSIS — M545 Low back pain, unspecified: Secondary | ICD-10-CM | POA: Diagnosis not present

## 2021-05-10 DIAGNOSIS — E119 Type 2 diabetes mellitus without complications: Secondary | ICD-10-CM

## 2021-05-10 LAB — POCT GLYCOSYLATED HEMOGLOBIN (HGB A1C): Hemoglobin A1C: 9.5 % — AB (ref 4.0–5.6)

## 2021-05-10 MED ORDER — RYBELSUS 7 MG PO TABS: 7.0000 mg | ORAL_TABLET | Freq: Every day | ORAL | 1 refills | Status: DC

## 2021-05-10 NOTE — Progress Notes (Signed)
Amanda Rollins is a 71 y.o. female here for a follow up of Diabetes Mellitus.    History of Present Illness:   Chief Complaint  Patient presents with   Diabetes    She has not been checking her blood sugars as home.    HPI  Diabetes 3 month follow-up. Current DM meds: rybelsus 3 mg tabs. Blood sugars at home are: not checked. She does believe that increased HgbA1c is due to the fact that she hasn't been eating well these last couple of months. States that life has been a little hectic especially with the birth of her grandchild, which led to her eating out more. Patient is compliant with medication, but is open to increasing the dosage to 7 mg as discussed in previous visit on 01/18/21. She is also interested in starting a workout program to help improve her numbers. Denies: hypoglycemic or hyperglycemic episodes or symptoms.   Lab Results  Component Value Date   HGBA1C 7.7 (H) 01/18/2021   Lower Back Pain Amanda Rollins reports that for the past month she has been experiencing lower back pain. States that the pain can be described as tender which usually occurs after walking for extended periods of time. Currently she expresses that the pain level has remained even, not increasing or decreasing. At this time she is interested in starting PT if needed, but would like to start exercising first to see if extra weight in abdominal region could be the cause. Denies pain radiation or urinary/bowel incontinence.   Past Medical History:  Diagnosis Date   Breast cancer ([place]) 2011   Diabetes mellitus type 2, controlled ([place])    Heart murmur    Hyperlipidemia    Hypertension    Hypothyroidism      Social History   Tobacco Use   Smoking status: Former    Types: Cigarettes    Start date: 02/07/1970   Smokeless tobacco: Never  Substance Use Topics   Alcohol use: Yes    Alcohol/week: 3.0 standard drinks    Types: 3 Glasses of wine per week   Drug use: Never    Past Surgical  History:  Procedure Laterality Date   ABDOMINAL HYSTERECTOMY  2000   Still has ovaries   BELPHAROPTOSIS REPAIR  07/2019   BREAST SURGERY     CATARACT EXTRACTION  2019   Left eye 2019, Right eye 2020   CESAREAN SECTION  12/08/1990   MASTECTOMY  2011   Bilateral   ROBOTIC ASSISTED LAPAROSCOPIC VAGINAL HYSTERECTOMY WITH FIBROID REMOVAL  1991    Family History  Adopted: Yes  Problem Relation Age of Onset   Cancer Mother        small cell cancer of liver and lungs    Allergies  Allergen Reactions   Sulfa Antibiotics Rash   Metformin Diarrhea    Current Medications:   Current Outpatient Medications:    amLODipine (NORVASC) 5 MG tablet, Take 1 tablet (5 mg total) by mouth daily., Disp: 90 tablet, Rfl: 3   calcium-vitamin D (OSCAL WITH D) 250-125 MG-UNIT tablet, Take 1 tablet by mouth daily., Disp: , Rfl:    fluticasone (FLONASE) 50 MCG/ACT nasal spray, SHAKE LIQUID AND USE 2 SPRAYS IN EACH NOSTRIL DAILY, Disp: 16 g, Rfl: 2   levothyroxine (SYNTHROID) 50 MCG tablet, Take 1 tablet (50 mcg total) by mouth daily., Disp: 90 tablet, Rfl: 1   losartan-hydrochlorothiazide (HYZAAR) 100-12.5 MG tablet, Take 1 tablet by mouth daily., Disp: 90 tablet, Rfl: 3   magnesium  gluconate (MAGONATE) 500 MG tablet, Take 500 mg by mouth 2 (two) times daily., Disp: , Rfl:    rosuvastatin (CRESTOR) 5 MG tablet, Take 1 tablet (5 mg total) by mouth daily., Disp: 90 tablet, Rfl: 1   Semaglutide (RYBELSUS) 7 MG TABS, Take 7 mg by mouth daily., Disp: 30 tablet, Rfl: 2   triamcinolone ointment (KENALOG) 0.5 %, Use twice daily, Disp: 15 g, Rfl: 0   denosumab (PROLIA) 60 MG/ML SOSY injection, Inject 60 mg into the skin every 6 (six) months. (Patient not taking: Reported on 05/10/2021), Disp: , Rfl:    Review of Systems:   ROS Negative unless otherwise specified per HPI. Vitals:   Vitals:   05/10/21 0845  BP: 122/70  Pulse: 82  Temp: 98 F (36.7 C)  TempSrc: Temporal  SpO2: 98%   Weight: 195 lb (88.5 kg)  Height: 5\' 4"  (1.626 m)     Body mass index is 33.47 kg/m.  Physical Exam:   Physical Exam Vitals and nursing note reviewed.  Constitutional:      General: She is not in acute distress.    Appearance: She is well-developed. She is not ill-appearing or toxic-appearing.  Cardiovascular:     Rate and Rhythm: Normal rate and regular rhythm.     Pulses: Normal pulses.     Heart sounds: Normal heart sounds, S1 normal and S2 normal.  Pulmonary:     Effort: Pulmonary effort is normal.     Breath sounds: Normal breath sounds.  Musculoskeletal:     Comments: No decreased ROM 2/2 pain with flexion/extension, lateral side bends, or rotation. No reproducible tenderness with deep palpation to bilateral paraspinal muscles. No bony tenderness. No evidence of erythema, rash or ecchymosis. Negative STLR bilaterally.   Skin:    General: Skin is warm and dry.  Neurological:     Mental Status: She is alert.     GCS: GCS eye subscore is 4. GCS verbal subscore is 5. GCS motor subscore is 6.  Psychiatric:        Speech: Speech normal.        Behavior: Behavior normal. Behavior is cooperative.   Results for orders placed or performed in visit on 05/10/21  POCT HgB A1C  Result Value Ref Range   Hemoglobin A1C 9.5 (A) 4.0 - 5.6 %     Assessment and Plan:   Controlled type 2 diabetes mellitus without complication, without long-term current use of insulin (HCC) Uncontrolled, HgbA1c increase from 7.7% to 9.5% Increase Rybelsus to 7 mg daily  Encouraged patient to work on making healthier dietary choices and participate in daily exercise Will send referral to desired workout program Will order referral for DM eye exam to Dr. Zenia Resides office Follow up in 3 months, sooner if concerns occur  Acute Lower Back Pain No red flags on exam Patient would like to continue to monitor symptoms Denies need for medication, imaging or PT If worsening or persists in next 4-6 weeks, I  have asked her to reach out to [place]   I,Amanda Rollins,acting as a scribe for [employer], PA.,have documented all relevant documentation on the behalf of Amanda Coke, PA,as directed by  Amanda Coke, PA while in the presence of Amanda Rollins, [place].  I, Amanda Rollins, [place], have reviewed all documentation for this visit. The documentation on 05/10/21 for the exam, diagnosis, procedures, and orders are all accurate and complete.   Amanda Coke, PA-C

## 2021-05-10 NOTE — Telephone Encounter (Signed)
Called to discuss PREP, she will be out of town for couple of weeks in February so will wait to start PREP, plans to attend class that starts on 2/21 every T/Th 12-1:15 at [employer]

## 2021-05-31 ENCOUNTER — Ambulatory Visit: Payer: Medicare Other

## 2021-06-14 ENCOUNTER — Other Ambulatory Visit: Payer: Self-pay | Admitting: Physician Assistant

## 2021-06-14 NOTE — Progress Notes (Signed)
[facility] PREP Evaluation  Patient Details  Name: Amanda Rollins MRN: [id number] Date of Birth: Jun 15, 1950 Age: 71 y.o. PCP: Inda Coke, PA  Vitals:   06/14/21 1446  BP: (!) 112/58  Pulse: 80  SpO2: 97%  Weight: 190 lb 9.6 oz (86.5 kg)     [facility] Eval - 06/14/21 1400       [facility] "PREP" Location   [facility] "PREP" Location [facility]      Referral    Referring Provider Morene Rankins    Reason for referral Cancer;Diabetes;Hypertension;Obesitity/Overweight    Program Start Date 06/14/21      Measurement   Waist Circumference 43.5 inches    Hip Circumference 50.5 inches    Body fat 43.6 percent      Information for Trainer   Goals --   lose 5-10 lbs by end of program; establish healthy eating habits   Current Exercise none    Orthopedic Concerns --   lympedema R arm, wears sleeve/glove; limited ROM both shoulders; R knee poss torn meniscus   Pertinent Medical History --   bilat mastectomies; R breast CA, fx R humerus, HTN, diabetes     Timed Up and Go (TUGS)   Timed Up and Go Low risk <9 seconds      Mobility and Daily Activities   I find it easy to walk up or down two or more flights of stairs. 1    I have no trouble taking out the trash. 4    I do housework such as vacuuming and dusting on my own without difficulty. 4    I can easily lift a gallon of milk (8lbs). 4    I can easily walk a mile. 2    I have no trouble reaching into high cupboards or reaching down to pick up something from the floor. 3    I do not have trouble doing out-door work such as Armed forces logistics/support/administrative officer, raking leaves, or gardening. 2      Mobility and Daily Activities   I feel younger than my age. 4    I feel independent. 4    I feel energetic. 2    I live an active life.  2    I feel strong. 2    I feel healthy. 2    I feel active as other people my age. 4      How fit and strong are you.   Fit and Strong Total Score 40            Past Medical History:  Diagnosis Date   Breast cancer  ([place]) 2011   Diabetes mellitus type 2, controlled ([place])    Heart murmur    Hyperlipidemia    Hypertension    Hypothyroidism    Past Surgical History:  Procedure Laterality Date   ABDOMINAL HYSTERECTOMY  2000   Still has ovaries   BELPHAROPTOSIS REPAIR  07/2019   BREAST SURGERY     CATARACT EXTRACTION  2019   Left eye 2019, Right eye 2020   CESAREAN SECTION  12/08/1990   MASTECTOMY  2011   Bilateral   ROBOTIC ASSISTED LAPAROSCOPIC VAGINAL HYSTERECTOMY WITH FIBROID REMOVAL  1991   Social History   Tobacco Use  Smoking Status Former   Types: Cigarettes   Start date: 02/07/1970  Smokeless Tobacco Never  To begin PREP classes today, has been out of town; will attend at [employer] every T/Th 12-1:15  Terrilyn Tyner B Zelia Yzaguirre 06/14/2021, 2:51 PM

## 2021-06-15 ENCOUNTER — Ambulatory Visit: Payer: Medicare Other

## 2021-06-19 NOTE — Progress Notes (Signed)
[facility] PREP Weekly Session  Patient Details  Name: Amanda Rollins MRN: [id number] Date of Birth: 10-14-1950 Age: 71 y.o. PCP: Inda Coke, PA  Vitals:   06/19/21 1420  Weight: 194 lb (88 kg)     [facility] Weekly seesion - 06/19/21 1400       [facility] "PREP" Location   [facility] "PREP" Location [facility]      Weekly Session   Topic Discussed Importance of resistance training;Other ways to be active   Rx: work up to 150 minutes cardio/wk, strength training 2-3 times/wk for 20-40 minutes; Water: drink 1/2 body wt in oz, or start at 64 oz/day; [facility] app   Minutes exercised this week 60 minutes    Classes attended to date 2             Amanda Rollins Amanda Rollins 06/19/2021, 2:20 PM

## 2021-06-22 ENCOUNTER — Other Ambulatory Visit: Payer: Self-pay

## 2021-06-22 ENCOUNTER — Ambulatory Visit (INDEPENDENT_AMBULATORY_CARE_PROVIDER_SITE_OTHER): Payer: Medicare Other

## 2021-06-22 VITALS — BP 120/68 | HR 93 | Temp 97.6°F | Wt 192.0 lb

## 2021-06-22 DIAGNOSIS — Z0000 Encounter for general adult medical examination without abnormal findings: Secondary | ICD-10-CM

## 2021-06-22 NOTE — Patient Instructions (Signed)
Amanda Rollins , Thank you for taking time to come for your Medicare Wellness Visit. I appreciate your ongoing commitment to your health goals. Please review the following plan we discussed and let me know if I can assist you in the future.   Screening recommendations/referrals: Colonoscopy: No longer required per pt  Mammogram: discontinued  Bone Density: Done 04/03/20 repeat every 2 years  Recommended yearly ophthalmology/optometry visit for glaucoma screening and checkup Recommended yearly dental visit for hygiene and checkup  Vaccinations: Influenza vaccine: Done 01/18/21 repeat every year  Pneumococcal vaccine: Up to date Tdap vaccine: Due and discussed  Covid-19:Completed 3/5, 4/2, 04/11/20  Advanced directives: Please bring a copy of your health care power of attorney and living will to the office at your convenience.  Conditions/risks identified: Lose weight and stay active and exercise   Next appointment: Follow up in one year for your annual wellness visit    Preventive Care 73 Years and Older, Female Preventive care refers to lifestyle choices and visits with your health care provider that can promote health and wellness. What does preventive care include? A yearly physical exam. This is also called an annual well check. Dental exams once or twice a year. Routine eye exams. Ask your health care provider how often you should have your eyes checked. Personal lifestyle choices, including: Daily care of your teeth and gums. Regular physical activity. Eating a healthy diet. Avoiding tobacco and drug use. Limiting alcohol use. Practicing safe sex. Taking low-dose aspirin every day. Taking vitamin and mineral supplements as recommended by your health care provider. What happens during an annual well check? The services and screenings done by your health care provider during your annual well check will depend on your age, overall health, lifestyle risk factors, and family history  of disease. Counseling  Your health care provider may ask you questions about your: Alcohol use. Tobacco use. Drug use. Emotional well-being. Home and relationship well-being. Sexual activity. Eating habits. History of falls. Memory and ability to understand (cognition). Work and work Statistician. Reproductive health. Screening  You may have the following tests or measurements: Height, weight, and BMI. Blood pressure. Lipid and cholesterol levels. These may be checked every 5 years, or more frequently if you are over 59 years old. Skin check. Lung cancer screening. You may have this screening every year starting at age 73 if you have a 30-pack-year history of smoking and currently smoke or have quit within the past 15 years. Fecal occult blood test (FOBT) of the stool. You may have this test every year starting at age 73. Flexible sigmoidoscopy or colonoscopy. You may have a sigmoidoscopy every 5 years or a colonoscopy every 10 years starting at age 73. Hepatitis C blood test. Hepatitis B blood test. Sexually transmitted disease (STD) testing. Diabetes screening. This is done by checking your blood sugar (glucose) after you have not eaten for a while (fasting). You may have this done every 1-3 years. Bone density scan. This is done to screen for osteoporosis. You may have this done starting at age 73. Mammogram. This may be done every 1-2 years. Talk to your health care provider about how often you should have regular mammograms. Talk with your health care provider about your test results, treatment options, and if necessary, the need for more tests. Vaccines  Your health care provider may recommend certain vaccines, such as: Influenza vaccine. This is recommended every year. Tetanus, diphtheria, and acellular pertussis (Tdap, Td) vaccine. You may need a Td booster every  10 years. Zoster vaccine. You may need this after age 73. Pneumococcal 13-valent conjugate (PCV13) vaccine. One  dose is recommended after age 73. Pneumococcal polysaccharide (PPSV23) vaccine. One dose is recommended after age 73. Talk to your health care provider about which screenings and vaccines you need and how often you need them. This information is not intended to replace advice given to you by your health care provider. Make sure you discuss any questions you have with your health care provider. Document Released: 05/05/2015 Document Revised: 12/27/2015 Document Reviewed: 02/07/2015 Elsevier Interactive Patient Education  2017 [place] Prevention in the Home Falls can cause injuries. They can happen to people of all ages. There are many things you can do to make your home safe and to help prevent falls. What can I do on the outside of my home? Regularly fix the edges of walkways and driveways and fix any cracks. Remove anything that might make you trip as you walk through a door, such as a raised step or threshold. Trim any bushes or trees on the path to your home. Use bright outdoor lighting. Clear any walking paths of anything that might make someone trip, such as rocks or tools. Regularly check to see if handrails are loose or broken. Make sure that both sides of any steps have handrails. Any raised decks and porches should have guardrails on the edges. Have any leaves, snow, or ice cleared regularly. Use sand or salt on walking paths during winter. Clean up any spills in your garage right away. This includes oil or grease spills. What can I do in the bathroom? Use night lights. Install grab bars by the toilet and in the tub and shower. Do not use towel bars as grab bars. Use non-skid mats or decals in the tub or shower. If you need to sit down in the shower, use a plastic, non-slip stool. Keep the floor dry. Clean up any water that spills on the floor as soon as it happens. Remove soap buildup in the tub or shower regularly. Attach bath mats securely with double-sided  non-slip rug tape. Do not have throw rugs and other things on the floor that can make you trip. What can I do in the bedroom? Use night lights. Make sure that you have a light by your bed that is easy to reach. Do not use any sheets or blankets that are too big for your bed. They should not hang down onto the floor. Have a firm chair that has side arms. You can use this for support while you get dressed. Do not have throw rugs and other things on the floor that can make you trip. What can I do in the kitchen? Clean up any spills right away. Avoid walking on wet floors. Keep items that you use a lot in easy-to-reach places. If you need to reach something above you, use a strong step stool that has a grab bar. Keep electrical cords out of the way. Do not use floor polish or wax that makes floors slippery. If you must use wax, use non-skid floor wax. Do not have throw rugs and other things on the floor that can make you trip. What can I do with my stairs? Do not leave any items on the stairs. Make sure that there are handrails on both sides of the stairs and use them. Fix handrails that are broken or loose. Make sure that handrails are as long as the stairways. Check any carpeting to make  sure that it is firmly attached to the stairs. Fix any carpet that is loose or worn. Avoid having throw rugs at the top or bottom of the stairs. If you do have throw rugs, attach them to the floor with carpet tape. Make sure that you have a light switch at the top of the stairs and the bottom of the stairs. If you do not have them, ask someone to add them for you. What else can I do to help prevent falls? Wear shoes that: Do not have high heels. Have rubber bottoms. Are comfortable and fit you well. Are closed at the toe. Do not wear sandals. If you use a stepladder: Make sure that it is fully opened. Do not climb a closed stepladder. Make sure that both sides of the stepladder are locked into place. Ask  someone to hold it for you, if possible. Clearly mark and make sure that you can see: Any grab bars or handrails. First and last steps. Where the edge of each step is. Use tools that help you move around (mobility aids) if they are needed. These include: Canes. Walkers. Scooters. Crutches. Turn on the lights when you go into a dark area. Replace any light bulbs as soon as they burn out. Set up your furniture so you have a clear path. Avoid moving your furniture around. If any of your floors are uneven, fix them. If there are any pets around you, be aware of where they are. Review your medicines with your doctor. Some medicines can make you feel dizzy. This can increase your chance of falling. Ask your doctor what other things that you can do to help prevent falls. This information is not intended to replace advice given to you by your health care provider. Make sure you discuss any questions you have with your health care provider. Document Released: 02/02/2009 Document Revised: 09/14/2015 Document Reviewed: 05/13/2014 Elsevier Interactive Patient Education  2017 [employer].

## 2021-06-22 NOTE — Progress Notes (Addendum)
Subjective:   Amanda Rollins is a 71 y.o. female who presents for Medicare Annual (Subsequent) preventive examination.  Review of Systems     Cardiac Risk Factors include: advanced age (>33men, >87 women);diabetes mellitus;dyslipidemia;hypertension;obesity (BMI >30kg/m2)     Objective:    Today's Vitals   06/22/21 1340  BP: 120/68  Pulse: 93  Temp: 97.6 F (36.4 C)  SpO2: 94%  Weight: 192 lb (87.1 kg)   Body mass index is 32.96 kg/m.  Advanced Directives 06/22/2021 02/28/2021 05/26/2020 04/11/2020  Does Patient Have a Medical Advance Directive? Yes No Yes Yes  Type of Advance Directive Living will - Healthcare Power of [place];Living will;Out of facility DNR (pink MOST or yellow form)  Copy of [place] in Chart? - - No - copy requested -  Would patient like information on creating a medical advance directive? - No - Patient declined - -    Current Medications (verified) Outpatient Encounter Medications as of 06/22/2021  Medication Sig   amLODipine (NORVASC) 5 MG tablet Take 1 tablet (5 mg total) by mouth daily.   calcium-vitamin D (OSCAL WITH D) 250-125 MG-UNIT tablet Take 1 tablet by mouth daily.   denosumab (PROLIA) 60 MG/ML SOSY injection Inject 60 mg into the skin every 6 (six) months.   fluticasone (FLONASE) 50 MCG/ACT nasal spray SHAKE LIQUID AND USE 2 SPRAYS IN EACH NOSTRIL DAILY   levothyroxine (SYNTHROID) 50 MCG tablet TAKE 1 TABLET(50 MCG) BY MOUTH DAILY   losartan-hydrochlorothiazide (HYZAAR) 100-12.5 MG tablet Take 1 tablet by mouth daily.   magnesium gluconate (MAGONATE) 500 MG tablet Take 500 mg by mouth 2 (two) times daily.   rosuvastatin (CRESTOR) 5 MG tablet Take 1 tablet (5 mg total) by mouth daily.   Semaglutide (RYBELSUS) 7 MG TABS Take 7 mg by mouth daily.   triamcinolone ointment (KENALOG) 0.5 % Use twice daily   No facility-administered encounter medications on file as of 06/22/2021.    Allergies  (verified) Sulfa antibiotics and Metformin   History: Past Medical History:  Diagnosis Date   Breast cancer ([place]) 2011   Diabetes mellitus type 2, controlled ([place])    Heart murmur    Hyperlipidemia    Hypertension    Hypothyroidism    Past Surgical History:  Procedure Laterality Date   ABDOMINAL HYSTERECTOMY  2000   Still has ovaries   BELPHAROPTOSIS REPAIR  07/2019   BREAST SURGERY     CATARACT EXTRACTION  2019   Left eye 2019, Right eye 2020   CESAREAN SECTION  12/08/1990   MASTECTOMY  2011   Bilateral   ROBOTIC ASSISTED LAPAROSCOPIC VAGINAL HYSTERECTOMY WITH FIBROID REMOVAL  1991   Family History  Adopted: Yes  Problem Relation Age of Onset   Cancer Mother        small cell cancer of liver and lungs   Social History   Socioeconomic History   Marital status: Widowed    Spouse name: Not on file   Number of children: Not on file   Years of education: Not on file   Highest education level: Not on file  Occupational History   Occupation: Retired   Tobacco Use   Smoking status: Former    Types: Cigarettes    Start date: 02/07/1970   Smokeless tobacco: Never  Substance and Sexual Activity   Alcohol use: Yes    Alcohol/week: 3.0 standard drinks    Types: 3 Glasses of wine per week   Drug use:  Never   Sexual activity: Not on file  Other Topics Concern   Not on file  Social History Narrative   Widow   Moved from [place] in about    Dentist -- former   Social Determinants of Radio broadcast assistant Strain: Low Risk    Difficulty of Paying Living Expenses: Not hard at all  Food Insecurity: No Food Insecurity   Worried About Charity fundraiser in the Last Year: Never true   Arboriculturist in the Last Year: Never true  Transportation Needs: No Transportation Needs   Lack of Transportation (Medical): No   Lack of Transportation (Non-Medical): No  Physical Activity: Insufficiently Active   Days of Exercise per Week: 2 days   Minutes of Exercise  per Session: 60 min  Stress: No Stress Concern Present   Feeling of Stress : Not at all  Social Connections: Moderately Isolated   Frequency of Communication with Friends and Family: More than three times a week   Frequency of Social Gatherings with Friends and Family: More than three times a week   Attends Religious Services: More than 4 times per year   Active Member of [employer] or Organizations: No   Attends Archivist Meetings: Never   Marital Status: Widowed    Tobacco Counseling Counseling given: Not Answered   Clinical Intake:  Pre-visit preparation completed: Yes  Pain : No/denies pain     BMI - recorded: 32.96 Nutritional Status: BMI > 30  Obese Nutritional Risks: None Diabetes: Yes CBG done?: No Did pt. bring in CBG monitor from home?: No  How often do you need to have someone help you when you read instructions, pamphlets, or other written materials from your doctor or pharmacy?: 1 - Never  Diabetic?Nutrition Risk Assessment:  Has the patient had any N/V/D within the last 2 months?  No  Does the patient have any non-healing wounds?  No  Has the patient had any unintentional weight loss or weight gain?  No   Diabetes:  Is the patient diabetic?  Yes  If diabetic, was a CBG obtained today?  No  Did the patient bring in their glucometer from home?  No  How often do you monitor your CBG's? N/A.   Financial Strains and Diabetes Management:  Are you having any financial strains with the device, your supplies or your medication? No .  Does the patient want to be seen by Chronic Care Management for management of their diabetes?  No  Would the patient like to be referred to a Nutritionist or for Diabetic Management?  No   Diabetic Exams:  Diabetic Eye Exam: Overdue for diabetic eye exam. Pt has been advised about the importance in completing this exam. Patient advised to call and schedule an eye exam. Diabetic Foot Exam: Completed 07/26/20   Interpreter  Needed?: No  Information entered by :: Charlott Rakes, LPN   Activities of Daily Living In your present state of health, do you have any difficulty performing the following activities: 06/22/2021  Hearing? Y  Comment slight HOH  Vision? N  Difficulty concentrating or making decisions? N  Walking or climbing stairs? N  Dressing or bathing? N  Doing errands, shopping? N  Preparing Food and eating ? N  Using the Toilet? N  In the past six months, have you accidently leaked urine? N  Do you have problems with loss of bowel control? N  Managing your Medications? N  Managing your Finances?  N  Housekeeping or managing your Housekeeping? N  Some recent data might be hidden    Patient Care Team: Inda Coke, [place] as PCP - General (Physician Assistant)  Indicate any recent Medical Services you may have received from other than [facility] providers in the past year (date may be approximate).     Assessment:   This is a routine wellness examination for Amanda Rollins.  Hearing/Vision screen Hearing Screening - Comments:: Pt stated HOH at times  Vision Screening - Comments:: Pt follows up with Dr Katy Fitch for annual eye exams   Dietary issues and exercise activities discussed: Current Exercise Habits: Structured exercise class, Type of exercise: Other - see comments, Time (Minutes): 60, Frequency (Times/Week): 2, Weekly Exercise (Minutes/Week): 120   Goals Addressed             This Visit's Progress    Patient Stated       Lose weight and stay active        Depression Screen PHQ 2/9 Scores 06/22/2021 05/10/2021 01/29/2021 05/26/2020 02/08/2020  PHQ - 2 Score 0 0 0 0 0  PHQ- 9 Score - 1 - - -    Fall Risk Fall Risk  06/22/2021 05/26/2020 02/08/2020  Falls in the past year? 0 1 0  Number falls in past yr: 0 1 -  Injury with Fall? 0 0 -  Risk for fall due to : - - No Fall Risks  Follow up Falls prevention discussed Falls prevention discussed -    FALL RISK PREVENTION PERTAINING TO THE  HOME:  Any stairs in or around the home? No  If so, are there any without handrails? No  Home free of loose throw rugs in walkways, pet beds, electrical cords, etc? Yes  Adequate lighting in your home to reduce risk of falls? Yes   ASSISTIVE DEVICES UTILIZED TO PREVENT FALLS:  Life alert? Apple I phone alert  Use of a cane, walker or w/c? No  Grab bars in the bathroom? No  Shower chair or bench in shower? No  Elevated toilet seat or a handicapped toilet? No   TIMED UP AND GO:  Was the test performed? Yes .  Length of time to ambulate 10 feet: 15 sec.   Gait steady and fast without use of assistive device  Cognitive Function:     6CIT Screen 06/22/2021 05/26/2020  What Year? 0 points 0 points  What month? 0 points 0 points  What time? 0 points -  Count back from 20 0 points 0 points  Months in reverse 0 points 0 points  Repeat phrase 2 points 2 points  Total Score 2 -    Immunizations Immunization History  Administered Date(s) Administered   Fluad Quad(high Dose 65+) 04/07/2019, 03/27/2020, 01/18/2021   Influenza Split 02/05/2012   Moderna Sars-Covid-2 Vaccination 06/25/2019, 07/23/2019, 04/11/2020   Pneumococcal Conjugate-13 12/10/2017   Pneumococcal Polysaccharide-23 03/24/2019   Td 03/02/2010    TDAP status: Due, Education has been provided regarding the importance of this vaccine. Advised may receive this vaccine at local pharmacy or Health Dept. Aware to provide a copy of the vaccination record if obtained from local pharmacy or Health Dept. Verbalized acceptance and understanding.  Flu Vaccine status: Up to date  Pneumococcal vaccine status: Up to date  Covid-19 vaccine status: Completed vaccines  Qualifies for Shingles Vaccine? No   Zostavax completed No     Screening Tests Health Maintenance  Topic Date Due   OPHTHALMOLOGY EXAM  Never done   TETANUS/TDAP  03/02/2020   COVID-19 Vaccine (4 - Booster for Moderna series) 06/06/2020   FOOT EXAM   07/26/2021   HEMOGLOBIN A1C  11/07/2021   Pneumonia Vaccine 24+ Years old  Completed   INFLUENZA VACCINE  Completed   DEXA SCAN  Completed   Hepatitis C Screening  Completed   HPV VACCINES  Aged Out   MAMMOGRAM  Discontinued   COLONOSCOPY (Pts 45-62yrs Insurance coverage will need to be confirmed)  Discontinued   Zoster Vaccines- Shingrix  Discontinued    Health Maintenance  Health Maintenance Due  Topic Date Due   OPHTHALMOLOGY EXAM  Never done   TETANUS/TDAP  03/02/2020   COVID-19 Vaccine (4 - Booster for Moderna series) 06/06/2020    Colorectal cancer screening: No longer required. Per pt   Mammogram status: No longer required due to  .  Bone Density status: Completed 04/03/20. Results reflect: Bone density results: OSTEOPENIA. Repeat every 2 years.   Additional Screening:  Hepatitis C Screening:  Completed 05/05/20  Vision Screening: Recommended annual ophthalmology exams for early detection of glaucoma and other disorders of the eye. Is the patient up to date with their annual eye exam?  No  Who is the provider or what is the name of the office in which the patient attends annual eye exams? Will follow up with Dr Katy Fitch 5/23 If pt is not established with a provider, would they like to be referred to a provider to establish care? No .   Dental Screening: Recommended annual dental exams for proper oral hygiene  Community Resource Referral / Chronic Care Management: CRR required this visit?  No   CCM required this visit?  No      Plan:     I have personally reviewed and noted the following in the patients chart:   Medical and social history Use of alcohol, tobacco or illicit drugs  Current medications and supplements including opioid prescriptions.  Functional ability and status Nutritional status Physical activity Advanced directives List of other physicians Hospitalizations, surgeries, and ER visits in previous 12 months Vitals Screenings to include  cognitive, depression, and falls Referrals and appointments  In addition, I have reviewed and discussed with patient certain preventive protocols, quality metrics, and best practice recommendations. A written personalized care plan for preventive services as well as general preventive health recommendations were provided to patient.     Willette Brace, LPN   05/24/252   Nurse Notes: None

## 2021-06-26 NOTE — Progress Notes (Signed)
[facility] PREP Weekly Session ? ?Patient Details  ?Name: Amanda Rollins ?MRN: [id number] ?Date of Birth: May 27, 1950 ?Age: 71 y.o. ?PCP: Inda Coke, PA ? ?Vitals:  ? 06/26/21 1324  ?Weight: 190 lb (86.2 kg)  ? ? ? [facility] Weekly seesion - 06/26/21 1300   ? ?  ? [facility]PREP[facility] Location  ? [facility]   ?  ? Weekly Session  ? Topic Discussed Healthy eating tips   2 cups fruits/veggies a day; Na intake 1500-'2300mg'$ ; Sugar 24 gm women, 36 gm men  ? Minutes exercised this week 90 minutes   ? Classes attended to date 4   ? ?  ?  ? ?  ? ? ?[place] ?06/26/2021, 1:25 PM ? ? ?

## 2021-07-03 NOTE — Progress Notes (Signed)
[facility] PREP Weekly Session ? ?Patient Details  ?Name: Amanda Rollins ?MRN: [id number] ?Date of Birth: February 26, 1951 ?Age: 71 y.o. ?PCP: Inda Coke, PA ? ?There were no vitals filed for this visit. ? ? [facility] Weekly seesion - 07/03/21 1400   ? ?  ? [facility]  ? [facility]   ?  ? Weekly Session  ? Topic Discussed Health habits   Sugar demo  ? Minutes exercised this week 150 minutes   ? Classes attended to date 6   ? ?  ?  ? ?  ?Reviewed week 4 upper and lower body machines today in anticipation of instructor LOA for next 2 weeks; encouraged to keep attending Y at least twice a week for cardio/strength training with [facility] staff assistance. ? ?[name] ?07/03/2021, 2:17 PM ? ? ?

## 2021-07-17 ENCOUNTER — Other Ambulatory Visit: Payer: Self-pay | Admitting: Physician Assistant

## 2021-07-24 NOTE — Progress Notes (Signed)
[facility] PREP Weekly Session ? ?Patient Details  ?Name: Amanda Rollins ?MRN: [id number] ?Date of Birth: 1950/09/20 ?Age: 71 y.o. ?PCP: Inda Coke, PA ? ?There were no vitals filed for this visit. ? ? [facility] Weekly seesion - 07/24/21 1500   ? ?  ? [facility]  ? [facility]   ?  ? Weekly Session  ? Topic Discussed Restaurant Eating   Salt demo, low sodium flavoring tips; nutrition label review  ? Minutes exercised this week 45 minutes   ? Classes attended to date 7   ? ?  ?  ? ?  ? ? ?[place] ?07/24/2021, 3:03 PM ? ? ?

## 2021-07-31 NOTE — Progress Notes (Signed)
[facility] PREP Weekly Session ? ?Patient Details  ?Name: Amanda Rollins ?MRN: [id number] ?Date of Birth: 02-17-1951 ?Age: 71 y.o. ?PCP: Inda Coke, PA ? ?There were no vitals filed for this visit. ? ? [facility] Weekly seesion - 07/31/21 1300   ? ?  ? [facility]P" [facility]  ? [facility]   ?  ? Weekly Session  ? Topic Discussed Stress management and problem solving   importance of sleep; finger tip breathwork mudra; shared healthy lifestyle meditation  ? Minutes exercised this week 45 minutes   ? Classes attended to date 2   ? ?  ?  ? ?  ? ? ?[place] ?07/31/2021, 1:25 PM ? ? ?

## 2021-08-07 NOTE — Progress Notes (Signed)
[facility] PREP Weekly Session ? ?Patient Details  ?Name: Amanda Rollins ?MRN: [id number] ?Date of Birth: 09/14/1950 ?Age: 71 y.o. ?PCP: Inda Coke, PA ? ?Vitals:  ? 08/07/21 1607  ?Weight: 191 lb 6.4 oz (86.8 kg)  ? ? ? [facility] Weekly seesion - 08/07/21 1600   ? ?  ? [facility]  ? [facility]   ?  ? Weekly Session  ? Topic Discussed Expectations and non-scale victories   Staying positive; 5 lb. muscle/fat comparison; Revisit/review/reset goals halfway through program  ? Minutes exercised this week 130 minutes   ? Classes attended to date 5   ? ?  ?  ? ?  ? ? ?[place] ?08/07/2021, 4:08 PM ? ? ?

## 2021-08-10 ENCOUNTER — Ambulatory Visit (INDEPENDENT_AMBULATORY_CARE_PROVIDER_SITE_OTHER): Payer: Medicare Other | Admitting: Physician Assistant

## 2021-08-10 ENCOUNTER — Encounter: Payer: Self-pay | Admitting: Physician Assistant

## 2021-08-10 VITALS — BP 132/78 | HR 90 | Temp 98.1°F | Ht 64.0 in | Wt 192.0 lb

## 2021-08-10 DIAGNOSIS — E119 Type 2 diabetes mellitus without complications: Secondary | ICD-10-CM | POA: Diagnosis not present

## 2021-08-10 DIAGNOSIS — E559 Vitamin D deficiency, unspecified: Secondary | ICD-10-CM

## 2021-08-10 DIAGNOSIS — M810 Age-related osteoporosis without current pathological fracture: Secondary | ICD-10-CM | POA: Diagnosis not present

## 2021-08-10 DIAGNOSIS — L989 Disorder of the skin and subcutaneous tissue, unspecified: Secondary | ICD-10-CM | POA: Diagnosis not present

## 2021-08-10 MED ORDER — ALENDRONATE SODIUM 70 MG PO TABS: 70.0000 mg | ORAL_TABLET | ORAL | 2 refills | Status: DC

## 2021-08-10 NOTE — Patient Instructions (Signed)
It was great to see you! ? ?We will update blood work ? ?Likely restart Fosamax -- we will be in touch with the results ? ?Trial triamcinolone on your leg, if no improvement or any worsening, let me know ? ?Let's follow-up in 3 months, sooner if you have concerns. ? ?Take care, ? ?Inda Coke PA-C  ?

## 2021-08-10 NOTE — Progress Notes (Signed)
Amanda Rollins is a 71 y.o. female here for a new problem. ? ?History of Present Illness:  ? ?Chief Complaint  ?Patient presents with  ? Leg Pain  ?  Pt c/o leg pain in RLE; Vein area not new but very sore, and also red dot on same lower leg and very sore all the time not just wen touching; wants to also discuss recent dentist appt and severe bone loss in teeth so wants to talk about osteoporosis meds.   ? ? ?HPI ? ?Skin lesion ?Spot on R lower leg appeared a few weeks ago. It is painful. She feels like it might be getting bigger. Does not see dermatology. Has tried nothing. Is not oozing. ? ?Osteoporosis ?Went to the dentist on Tuesday.  She states that her dentist noticed that she has worsening bone loss in her teeth. They recommended follow-up with [place]. She was on fosamax weekly for a year or two and then her bone loss worsened to osteoporosis. She was started on prolia but this was too expensive for her to continue.  She does take calcium and vitamin D. ? ?Diabetes ?3 month follow-up. Current DM meds: rybelsus 7 mg. Blood sugars at home are: not checked. Patient is compliant compliant with medications. Denies: hypoglycemic or hyperglycemic episodes or symptoms. This patient's diabetes is complicated by HLD and HTN. ? ?Lab Results  ?Component Value Date  ? HGBA1C 9.5 (A) 05/10/2021  ? ? ? ?Past Medical History:  ?Diagnosis Date  ? Breast cancer [facility]) 2011  ? Diabetes mellitus type 2, controlled ([place])   ? Heart murmur   ? Hyperlipidemia   ? Hypertension   ? Hypothyroidism   ? ?  ?Social History  ? ?Tobacco Use  ? Smoking status: Former  ?  Types: Cigarettes  ?  Start date: 02/07/1970  ? Smokeless tobacco: Never  ?Substance Use Topics  ? Alcohol use: Yes  ?  Alcohol/week: 3.0 standard drinks  ?  Types: 3 Glasses of wine per week  ? Drug use: Never  ? ? ?Past Surgical History:  ?Procedure Laterality Date  ? ABDOMINAL HYSTERECTOMY  2000  ? Still has ovaries  ? [place]  07/2019  ? BREAST SURGERY    ?  CATARACT EXTRACTION  2019  ? Left eye 2019, Right eye 2020  ? CESAREAN SECTION  12/08/1990  ? MASTECTOMY  2011  ? Bilateral  ? ROBOTIC ASSISTED LAPAROSCOPIC VAGINAL HYSTERECTOMY WITH FIBROID REMOVAL  1991  ? ? ?Family History  ?Adopted: Yes  ?Problem Relation Age of Onset  ? Cancer Mother   ?     small cell cancer of liver and lungs  ? ? ?Allergies  ?Allergen Reactions  ? Sulfa Antibiotics Rash  ? Metformin Diarrhea  ? ? ?Current Medications:  ? ?Current Outpatient Medications:  ?  amLODipine (NORVASC) 5 MG tablet, Take 1 tablet (5 mg total) by mouth daily., Disp: 90 tablet, Rfl: 3 ?  calcium-vitamin D (OSCAL WITH D) 250-125 MG-UNIT tablet, Take 1 tablet by mouth daily., Disp: , Rfl:  ?  fluticasone (FLONASE) 50 MCG/ACT nasal spray, SHAKE LIQUID AND USE 2 SPRAYS IN EACH NOSTRIL DAILY, Disp: 16 g, Rfl: 2 ?  levothyroxine (SYNTHROID) 50 MCG tablet, TAKE 1 TABLET(50 MCG) BY MOUTH DAILY, Disp: 90 tablet, Rfl: 1 ?  losartan-hydrochlorothiazide (HYZAAR) 100-12.5 MG tablet, Take 1 tablet by mouth daily., Disp: 90 tablet, Rfl: 3 ?  magnesium gluconate (MAGONATE) 500 MG tablet, Take 500 mg by mouth 2 (two) times daily.,  Disp: , Rfl:  ?  rosuvastatin (CRESTOR) 5 MG tablet, TAKE 1 TABLET(5 MG) BY MOUTH DAILY, Disp: 90 tablet, Rfl: 1 ?  Semaglutide (RYBELSUS) 7 MG TABS, Take 7 mg by mouth daily., Disp: 90 tablet, Rfl: 1 ?  triamcinolone ointment (KENALOG) 0.5 %, Use twice daily, Disp: 15 g, Rfl: 0 ?  denosumab (PROLIA) 60 MG/ML SOSY injection, Inject 60 mg into the skin every 6 (six) months., Disp: , Rfl:   ? ?Review of Systems:  ? ?ROS ?Negative unless otherwise specified per HPI. ? ?Vitals:  ? ?Vitals:  ? 08/10/21 1441  ?BP: 132/78  ?Pulse: 90  ?Temp: 98.1 ?F (36.7 ?C)  ?TempSrc: Temporal  ?SpO2: 95%  ?Weight: 192 lb (87.1 kg)  ?Height: '5\' 4"'$  (1.626 m)  ?   ?Body mass index is 32.96 kg/m?. ? ?Physical Exam:  ? ?Physical Exam ?Vitals and nursing note reviewed.  ?Constitutional:   ?   General: She is not in acute distress. ?    Appearance: She is well-developed. She is not ill-appearing or toxic-appearing.  ?Cardiovascular:  ?   Rate and Rhythm: Normal rate and regular rhythm.  ?   Pulses: Normal pulses.  ?   Heart sounds: Normal heart sounds, S1 normal and S2 normal.  ?Pulmonary:  ?   Effort: Pulmonary effort is normal.  ?   Breath sounds: Normal breath sounds.  ?Skin: ?   General: Skin is warm and dry.  ?   Comments: 70m erythematous papule on lateral RLE with slight TTP no induration or fluctuance  ?Neurological:  ?   Mental Status: She is alert.  ?   GCS: GCS eye subscore is 4. GCS verbal subscore is 5. GCS motor subscore is 6.  ?Psychiatric:     ?   Speech: Speech normal.     ?   Behavior: Behavior normal. Behavior is cooperative.  ? ? ?Assessment and Plan:  ? ?Controlled type 2 diabetes mellitus without complication, without long-term current use of insulin ([place] ?Update A1c and adjust Rybelsus 7 mg daily ?Continue excellent BP control and sugar reduction ?Follow-up in 3 months, sooner if concerns ? ?Vitamin D deficiency ?Update Vit D and provide recommendations accordingly ? ?Osteoporosis without current pathological fracture, unspecified osteoporosis type ?Restart Fosamax weekly as long as labs permit ?Due for DEXA in December ? ?Skin lesion ?Unclear etiology ?Recommend trialing topical triamcinolone to area ?If no improvement in two weeks or any worsening, recommend follow-up with [place]? ? ?SInda Coke PA-C ?

## 2021-08-11 LAB — COMPREHENSIVE METABOLIC PANEL
AG Ratio: 1.5 (calc) (ref 1.0–2.5)
ALT: 12 U/L (ref 6–29)
AST: 14 U/L (ref 10–35)
Albumin: 4.3 g/dL (ref 3.6–5.1)
Alkaline phosphatase (APISO): 44 U/L (ref 37–153)
BUN: 16 mg/dL (ref 7–25)
CO2: 27 mmol/L (ref 20–32)
Calcium: 9.7 mg/dL (ref 8.6–10.4)
Chloride: 97 mmol/L — ABNORMAL LOW (ref 98–110)
Creat: 0.69 mg/dL (ref 0.60–1.00)
Globulin: 2.9 g/dL (calc) (ref 1.9–3.7)
Glucose, Bld: 129 mg/dL — ABNORMAL HIGH (ref 65–99)
Potassium: 4.1 mmol/L (ref 3.5–5.3)
Sodium: 133 mmol/L — ABNORMAL LOW (ref 135–146)
Total Bilirubin: 0.4 mg/dL (ref 0.2–1.2)
Total Protein: 7.2 g/dL (ref 6.1–8.1)

## 2021-08-11 LAB — HEMOGLOBIN A1C
Hgb A1c MFr Bld: 7.7 % of total Hgb — ABNORMAL HIGH (ref <5.7)
Mean Plasma Glucose: 174 mg/dL
eAG (mmol/L): 9.7 mmol/L

## 2021-08-11 LAB — CBC WITH DIFFERENTIAL/PLATELET
Absolute Monocytes: 718 cells/uL (ref 200–950)
Basophils Absolute: 39 cells/uL (ref 0–200)
Basophils Relative: 0.5 %
Eosinophils Absolute: 78 cells/uL (ref 15–500)
Eosinophils Relative: 1 %
HCT: 41.5 % (ref 35.0–45.0)
Hemoglobin: 13.7 g/dL (ref 11.7–15.5)
Lymphs Abs: 1810 cells/uL (ref 850–3900)
MCH: 28.8 pg (ref 27.0–33.0)
MCHC: 33 g/dL (ref 32.0–36.0)
MCV: 87.4 fL (ref 80.0–100.0)
MPV: 10.9 fL (ref 7.5–12.5)
Monocytes Relative: 9.2 %
Neutro Abs: 5156 cells/uL (ref 1500–7800)
Neutrophils Relative %: 66.1 %
Platelets: 252 10*3/uL (ref 140–400)
RBC: 4.75 10*6/uL (ref 3.80–5.10)
RDW: 12.4 % (ref 11.0–15.0)
Total Lymphocyte: 23.2 %
WBC: 7.8 10*3/uL (ref 3.8–10.8)

## 2021-08-11 LAB — VITAMIN D 25 HYDROXY (VIT D DEFICIENCY, FRACTURES): Vit D, 25-Hydroxy: 33 ng/mL (ref 30–100)

## 2021-08-14 NOTE — Progress Notes (Signed)
[facility] PREP Weekly Session ? ?Patient Details  ?Name: Amanda Rollins ?MRN: [id number] ?Date of Birth: 1950/06/12 ?Age: 71 y.o. ?PCP: Inda Coke, PA ? ?Vitals:  ? 08/14/21 1313  ?Weight: 192 lb (87.1 kg)  ? ? ? [facility] Weekly seesion - 08/14/21 1300   ? ?  ? [facility]PREP[facility] Location  ? [facility]   ?  ? Weekly Session  ? Topic Discussed Other   Portion Control; visualize your portion size demo; label review reduced sugar [place]; homework: to bring in nutrition label from home next week to review with group.  ? Minutes exercised this week 65 minutes   ? Classes attended to date 40   ? ?  ?  ? ?  ? ? ?[place] ?08/14/2021, 1:16 PM ? ? ?

## 2021-08-28 NOTE — Progress Notes (Signed)
[facility] PREP Weekly Session ? ?Patient Details  ?Name: Amanda Rollins ?MRN: [id number] ?Date of Birth: [birth year] ?Age: 71 y.o. ?PCP: Inda Coke, PA ? ?Vitals:  ? 08/28/21 1319  ?Weight: 192 lb (87.1 kg)  ? ? ? [facility] Weekly seesion - 08/28/21 1300   ? ?  ? [facility]PREP[facility] Location  ? [facility]   ?  ? Weekly Session  ? Topic Discussed Calorie breakdown   Importance of fats, proteins, and carbohydrates; simple vs complex carbs  ? Classes attended to date 50   ? ?  ?  ? ?  ? ? ?Amanda Rollins Amanda Rollins ?08/28/2021, 1:19 PM ? ? ?

## 2021-09-07 ENCOUNTER — Other Ambulatory Visit: Payer: Self-pay | Admitting: *Deleted

## 2021-09-07 MED ORDER — LOSARTAN POTASSIUM-HCTZ 100-12.5 MG PO TABS: 1.0000 | ORAL_TABLET | Freq: Every day | ORAL | 3 refills | Status: DC

## 2021-09-07 MED ORDER — AMLODIPINE BESYLATE 5 MG PO TABS: 5.0000 mg | ORAL_TABLET | Freq: Every day | ORAL | 3 refills | Status: DC

## 2021-09-13 NOTE — Progress Notes (Signed)
[facility] PREP Evaluation  Patient Details  Name: Amanda Rollins MRN: [id number] Date of Birth: 05/05/1950 Age: 71 y.o. PCP: Inda Coke, PA  Vitals:   09/13/21 1134  BP: (!) 112/58  Pulse: 91  SpO2: 98%  Weight: 190 lb 12.8 oz (86.5 kg)     [facility] Eval - 09/13/21 1100       [facility] "PREP" Location   [facility]PREP[facility]      Referral    Referring Provider [place]    Program Start Date 09/13/21   Final Assessment Visit     Measurement   Waist Circumference 44.5 inches    Hip Circumference 50 inches    Body fat 43.2 percent            Past Medical History:  Diagnosis Date   Breast cancer ([place]) 2011   Diabetes mellitus type 2, controlled ([place])    Heart murmur    Hyperlipidemia    Hypertension    Hypothyroidism    Past Surgical History:  Procedure Laterality Date   ABDOMINAL HYSTERECTOMY  2000   Still has ovaries   BELPHAROPTOSIS REPAIR  07/2019   BREAST SURGERY     CATARACT EXTRACTION  2019   Left eye 2019, Right eye 2020   CESAREAN SECTION  12/08/1990   MASTECTOMY  2011   Bilateral   ROBOTIC ASSISTED LAPAROSCOPIC VAGINAL HYSTERECTOMY WITH FIBROID REMOVAL  1991   Social History   Tobacco Use  Smoking Status Former   Types: Cigarettes   Start date: 02/07/1970  Smokeless Tobacco Never  Completed 8 education sessions and 7 workout sessions.  Amanda Rollins 09/13/2021, 11:36 AM

## 2021-10-12 ENCOUNTER — Encounter: Payer: Self-pay | Admitting: Physician Assistant

## 2021-10-30 ENCOUNTER — Encounter: Payer: Self-pay | Admitting: Physician Assistant

## 2021-10-31 ENCOUNTER — Encounter: Payer: Self-pay | Admitting: Physician Assistant

## 2021-10-31 ENCOUNTER — Ambulatory Visit (INDEPENDENT_AMBULATORY_CARE_PROVIDER_SITE_OTHER): Payer: Medicare Other | Admitting: Physician Assistant

## 2021-10-31 VITALS — BP 110/70 | HR 85 | Temp 97.6°F | Ht 64.0 in | Wt 191.0 lb

## 2021-10-31 DIAGNOSIS — M79605 Pain in left leg: Secondary | ICD-10-CM | POA: Diagnosis not present

## 2021-10-31 DIAGNOSIS — M25561 Pain in right knee: Secondary | ICD-10-CM | POA: Diagnosis not present

## 2021-10-31 DIAGNOSIS — W57XXXA Bitten or stung by nonvenomous insect and other nonvenomous arthropods, initial encounter: Secondary | ICD-10-CM

## 2021-10-31 DIAGNOSIS — M79604 Pain in right leg: Secondary | ICD-10-CM | POA: Diagnosis not present

## 2021-10-31 DIAGNOSIS — S1096XA Insect bite of unspecified part of neck, initial encounter: Secondary | ICD-10-CM

## 2021-10-31 DIAGNOSIS — G8929 Other chronic pain: Secondary | ICD-10-CM

## 2021-10-31 MED ORDER — GABAPENTIN 100 MG PO CAPS: ORAL_CAPSULE | ORAL | 1 refills | Status: DC

## 2021-10-31 NOTE — Progress Notes (Signed)
Amanda Rollins is a 71 y.o. female here for a follow up of a pre-existing problem.  History of Present Illness:   Chief Complaint  Patient presents with   check tick bite    Pt had tick bite 4 weeks ago on right upper back area.   Knee Pain    Pt c/o right knee pain, worse past month, along with lower back pain past 3-4 days.    HPI  Tick Bite  Patient presents with c/o of tick bite about 1 month ago. Located on right upper back. States she has tried to removed the tick bite but was unable to reach her back. She was able to removed part of tick with tweezers.  Per pt, her daughter was able to remove this about 2 weeks ago. She has applied Triamcinolone cream on her back. \ No fever or chills. Denies rash or redness around the area. Denies numbness or tingling. No reported neck stiffness or joint pain. Denies headaches. No other worsening symptoms.   Right Knee Pain Patient has been dealing with this for several years. Symptoms started to get worse for the past few months. She has seen Dr. Georgina Snell in the past and had steroid injection which provided relief for a while. She also tried tried PT in the past with some relief. Patient was on cruise and was on her feet for long periods of time. States she was walking a lot at that time. Has had some soreness and pain with bending. Thinks this could be contributing to her pain as well. Worse with certain motions. Pian is worse with bending. She has taken Aleve and icy hot cream with no relief. Per pt, she is having some bilateral leg pain as well. Denies leg swelling. Denies calf pain or swelling.   Bilateral Leg Pain  She complain of bilateral leg pain that has been onset for past few months. She described this as "achy" pain. She has recently  been on cruise and was walking most of the time. Thinks this could have been contributing. No specific treatment tried. Has had some poor circulation in her legs. She has found it difficult to walk due to her  pain and has stopped exercising due to this. No numbness or tingling.   Past Medical History:  Diagnosis Date   Breast cancer ([place]) 2011   Diabetes mellitus type 2, controlled ([place])    Heart murmur    Hyperlipidemia    Hypertension    Hypothyroidism      Social History   Tobacco Use   Smoking status: Former    Types: Cigarettes    Start date: 02/07/1970   Smokeless tobacco: Never  Substance Use Topics   Alcohol use: Yes    Alcohol/week: 3.0 standard drinks of alcohol    Types: 3 Glasses of wine per week   Drug use: Never    Past Surgical History:  Procedure Laterality Date   ABDOMINAL HYSTERECTOMY  2000   Still has ovaries   BELPHAROPTOSIS REPAIR  07/2019   BREAST SURGERY     CATARACT EXTRACTION  2019   Left eye 2019, Right eye 2020   CESAREAN SECTION  12/08/1990   MASTECTOMY  2011   Bilateral   ROBOTIC ASSISTED LAPAROSCOPIC VAGINAL HYSTERECTOMY WITH FIBROID REMOVAL  1991    Family History  Adopted: Yes  Problem Relation Age of Onset   Cancer Mother        small cell cancer of liver and lungs  Allergies  Allergen Reactions   Sulfa Antibiotics Rash   Metformin Diarrhea    Current Medications:   Current Outpatient Medications:    alendronate (FOSAMAX) 70 MG tablet, Take 1 tablet (70 mg total) by mouth every 7 (seven) days. Take with a full glass of water on an empty stomach., Disp: 12 tablet, Rfl: 2   amLODipine (NORVASC) 5 MG tablet, Take 1 tablet (5 mg total) by mouth daily., Disp: 90 tablet, Rfl: 3   calcium-vitamin D (OSCAL WITH D) 250-125 MG-UNIT tablet, Take 1 tablet by mouth daily., Disp: , Rfl:    fluticasone (FLONASE) 50 MCG/ACT nasal spray, SHAKE LIQUID AND USE 2 SPRAYS IN EACH NOSTRIL DAILY, Disp: 16 g, Rfl: 2   levothyroxine (SYNTHROID) 50 MCG tablet, TAKE 1 TABLET(50 MCG) BY MOUTH DAILY, Disp: 90 tablet, Rfl: 1   losartan-hydrochlorothiazide (HYZAAR) 100-12.5 MG tablet, Take 1 tablet by mouth daily., Disp: 90 tablet, Rfl: 3   magnesium  gluconate (MAGONATE) 500 MG tablet, Take 500 mg by mouth 2 (two) times daily., Disp: , Rfl:    rosuvastatin (CRESTOR) 5 MG tablet, TAKE 1 TABLET(5 MG) BY MOUTH DAILY, Disp: 90 tablet, Rfl: 1   Semaglutide (RYBELSUS) 7 MG TABS, Take 7 mg by mouth daily., Disp: 90 tablet, Rfl: 1   triamcinolone ointment (KENALOG) 0.5 %, Use twice daily, Disp: 15 g, Rfl: 0   Review of Systems:   ROS Negative unless otherwise specified per HPI.   Vitals:   Vitals:   10/31/21 0909  BP: 110/70  Pulse: 85  Temp: 97.6 F (36.4 C)  TempSrc: Temporal  SpO2: 96%  Weight: 191 lb (86.6 kg)  Height: '5\' 4"'$  (1.626 m)     Body mass index is 32.79 kg/m.  Physical Exam:   Physical Exam Vitals and nursing note reviewed.  Constitutional:      General: She is not in acute distress.    Appearance: She is well-developed. She is not ill-appearing or toxic-appearing.  Cardiovascular:     Rate and Rhythm: Normal rate and regular rhythm.     Pulses: Normal pulses.     Heart sounds: Normal heart sounds, S1 normal and S2 normal.  Pulmonary:     Effort: Pulmonary effort is normal.     Breath sounds: Normal breath sounds.  Skin:    General: Skin is warm and dry.  Neurological:     Mental Status: She is alert.     GCS: GCS eye subscore is 4. GCS verbal subscore is 5. GCS motor subscore is 6.  Psychiatric:        Speech: Speech normal.        Behavior: Behavior normal. Behavior is cooperative.     Assessment and Plan:   Pain in both lower extremities Sx concerning for claudication Obtain ABI's Compression hose, elevation Follow-up after ABI's  Tick bite of neck, initial encounter No lesion seen  Continue to monitor skin and sx If issues arise- recommend follow-up  Chronic pain of right knee Recommend close follow-up with Dr Georgina Snell -- considering gel injections for this Consider resumption of PT She wants to avoid rx NSAIDs Will trial gabapentin 100 mg nightly and may increase -- instructions provided  on AVS  I,Savera Zaman,acting as a scribe for [employer], PA.,have documented all relevant documentation on the behalf of Inda Coke, PA,as directed by  Inda Coke, PA while in the presence of Inda Coke, [place].   I, Inda Coke, [place], have reviewed all documentation for this visit. The documentation  on 10/31/21 for the exam, diagnosis, procedures, and orders are all accurate and complete.   Inda Coke, PA-C

## 2021-10-31 NOTE — Patient Instructions (Signed)
It was great to see you!  Gabapentin 100 mg nightly. May increase by 100 mg nightly to goal of 300 mg nightly. If this is not too sedating for you, you can also take gabapentin 100 mg in morning and at lunch. Some people will take this three times daily and others only at night -- depends on if sedating or not.  Please reach out to Dr Georgina Snell about your knees  I will order ultrasounds to assess blood flow in your legs  If you start to feel any flu-like symptoms and you suspect its from the tick, let me know

## 2021-11-01 ENCOUNTER — Ambulatory Visit (HOSPITAL_COMMUNITY)
Admission: RE | Admit: 2021-11-01 | Discharge: 2021-11-01 | Disposition: A | Discharge status: Home / Self Care | Payer: Medicare Other | Source: Ambulatory Visit | Attending: Physician Assistant | Admitting: Physician Assistant

## 2021-11-01 DIAGNOSIS — M79604 Pain in right leg: Secondary | ICD-10-CM

## 2021-11-01 DIAGNOSIS — M79605 Pain in left leg: Secondary | ICD-10-CM

## 2021-11-05 ENCOUNTER — Encounter: Payer: Self-pay | Admitting: Physician Assistant

## 2021-12-11 ENCOUNTER — Other Ambulatory Visit: Payer: Self-pay | Admitting: *Deleted

## 2021-12-11 MED ORDER — LEVOTHYROXINE SODIUM 50 MCG PO TABS: ORAL_TABLET | ORAL | 0 refills | Status: DC

## 2021-12-25 DIAGNOSIS — E119 Type 2 diabetes mellitus without complications: Secondary | ICD-10-CM | POA: Diagnosis not present

## 2021-12-25 DIAGNOSIS — H5213 Myopia, bilateral: Secondary | ICD-10-CM | POA: Diagnosis not present

## 2021-12-25 DIAGNOSIS — Z961 Presence of intraocular lens: Secondary | ICD-10-CM | POA: Diagnosis not present

## 2021-12-25 DIAGNOSIS — H353131 Nonexudative age-related macular degeneration, bilateral, early dry stage: Secondary | ICD-10-CM | POA: Diagnosis not present

## 2021-12-25 LAB — HM DIABETES EYE EXAM

## 2021-12-31 ENCOUNTER — Ambulatory Visit (INDEPENDENT_AMBULATORY_CARE_PROVIDER_SITE_OTHER): Payer: Medicare Other | Admitting: Physician Assistant

## 2021-12-31 ENCOUNTER — Other Ambulatory Visit: Payer: Self-pay | Admitting: Physician Assistant

## 2021-12-31 VITALS — BP 120/78 | HR 98 | Temp 98.2°F | Ht 64.0 in | Wt 195.0 lb

## 2021-12-31 DIAGNOSIS — N644 Mastodynia: Secondary | ICD-10-CM | POA: Diagnosis not present

## 2021-12-31 DIAGNOSIS — E119 Type 2 diabetes mellitus without complications: Secondary | ICD-10-CM

## 2021-12-31 LAB — POCT GLYCOSYLATED HEMOGLOBIN (HGB A1C): Hemoglobin A1C: 6.8 % — AB (ref 4.0–5.6)

## 2021-12-31 MED ORDER — GLIMEPIRIDE 2 MG PO TABS: 2.0000 mg | ORAL_TABLET | Freq: Every day | ORAL | 3 refills | Status: DC

## 2021-12-31 MED ORDER — GLIMEPIRIDE 4 MG PO TABS: 4.0000 mg | ORAL_TABLET | Freq: Every day | ORAL | 3 refills | Status: DC

## 2021-12-31 NOTE — Patient Instructions (Addendum)
It was great to see you!  We will start Glimepiride 4 mg daily Follow-up in 3 months to recheck A1c -- after December 10th  You should be contacted by the [facility].  Phone: [phone]  Take care,  Inda Coke PA-C

## 2021-12-31 NOTE — Progress Notes (Signed)
Amanda Rollins is a 71 y.o. female here for a follow up of a pre-existing problem.  History of Present Illness:   Chief Complaint  Patient presents with   Diabetes    Discuss diabetic medication change. Pt has pain on left side bellow breast area may be muscle pain x 3 days aleve for symptoms.    HPI  Diabetes 3-4 month follow-up. Current DM meds: rybelsus 7 mg daily. Blood sugars at home are: not regularly checked. Patient is  compliant with medications. Denies: hypoglycemic or hyperglycemic episodes or symptoms. This patient's diabetes is complicated by HTN and HLD. She is wanting to stop the rybelsus because it is very expensive.  Lab Results  Component Value Date   HGBA1C 6.8 (A) 12/31/2021    L chest wall/rib pain For the past 3 days has had chest wall pain on the left side under her breast scar. She has had some issues with this on the R side previously -- we were going to obtain imaging but she was unable to follow through with this due to the birth of her grandson. She does a lot of lifting of her grandson and is not sure if this is contributing.  Took aleve for her symptoms. She is planning to restart her gabapentin soon to see if this helps. Pain is worse with deep breaths. Denies: sob, palpitations, n/v, dizziness, lightheadedness.   Past Medical History:  Diagnosis Date   Breast cancer ([place]) 2011   Diabetes mellitus type 2, controlled ([place])    Heart murmur    Hyperlipidemia    Hypertension    Hypothyroidism      Social History   Tobacco Use   Smoking status: Former    Types: Cigarettes    Start date: 02/07/1970   Smokeless tobacco: Never  Substance Use Topics   Alcohol use: Yes    Alcohol/week: 3.0 standard drinks of alcohol    Types: 3 Glasses of wine per week   Drug use: Never    Past Surgical History:  Procedure Laterality Date   ABDOMINAL HYSTERECTOMY  2000   Still has ovaries   BELPHAROPTOSIS REPAIR  07/2019   BREAST SURGERY     CATARACT  EXTRACTION  2019   Left eye 2019, Right eye 2020   CESAREAN SECTION  12/08/1990   MASTECTOMY  2011   Bilateral   ROBOTIC ASSISTED LAPAROSCOPIC VAGINAL HYSTERECTOMY WITH FIBROID REMOVAL  1991    Family History  Adopted: Yes  Problem Relation Age of Onset   Cancer Mother        small cell cancer of liver and lungs    Allergies  Allergen Reactions   Sulfa Antibiotics Rash   Metformin Diarrhea    Current Medications:   Current Outpatient Medications:    glimepiride (AMARYL) 4 MG tablet, Take 1 tablet (4 mg total) by mouth daily before breakfast., Disp: 30 tablet, Rfl: 3   alendronate (FOSAMAX) 70 MG tablet, Take 1 tablet (70 mg total) by mouth every 7 (seven) days. Take with a full glass of water on an empty stomach., Disp: 12 tablet, Rfl: 2   amLODipine (NORVASC) 5 MG tablet, Take 1 tablet (5 mg total) by mouth daily., Disp: 90 tablet, Rfl: 3   calcium-vitamin D (OSCAL WITH D) 250-125 MG-UNIT tablet, Take 1 tablet by mouth daily., Disp: , Rfl:    fluticasone (FLONASE) 50 MCG/ACT nasal spray, SHAKE LIQUID AND USE 2 SPRAYS IN EACH NOSTRIL DAILY, Disp: 16 g, Rfl: 2   gabapentin (  NEURONTIN) 100 MG capsule, Take one tablet ( 100 mg) by mouth one hour before bedtime every night. May increase by 100 mg nightly to goal of 300 mg., Disp: 60 capsule, Rfl: 1   levothyroxine (SYNTHROID) 50 MCG tablet, TAKE 1 TABLET(50 MCG) BY MOUTH DAILY, Disp: 90 tablet, Rfl: 0   losartan-hydrochlorothiazide (HYZAAR) 100-12.5 MG tablet, Take 1 tablet by mouth daily., Disp: 90 tablet, Rfl: 3   magnesium gluconate (MAGONATE) 500 MG tablet, Take 500 mg by mouth 2 (two) times daily., Disp: , Rfl:    rosuvastatin (CRESTOR) 5 MG tablet, TAKE 1 TABLET(5 MG) BY MOUTH DAILY, Disp: 90 tablet, Rfl: 1   Semaglutide (RYBELSUS) 7 MG TABS, Take 7 mg by mouth daily., Disp: 90 tablet, Rfl: 1   triamcinolone ointment (KENALOG) 0.5 %, Use twice daily, Disp: 15 g, Rfl: 0   Review of Systems:   ROS Negative unless otherwise  specified per HPI.  Vitals:   Vitals:   12/31/21 1026  BP: 120/78  Pulse: 98  Temp: 98.2 F (36.8 C)  TempSrc: Temporal  SpO2: 98%  Weight: 195 lb (88.5 kg)  Height: '5\' 4"'$  (1.626 m)     Body mass index is 33.47 kg/m.  Physical Exam:   Physical Exam Vitals and nursing note reviewed.  Constitutional:      General: She is not in acute distress.    Appearance: She is well-developed. She is not ill-appearing or toxic-appearing.  Cardiovascular:     Rate and Rhythm: Normal rate and regular rhythm.     Pulses: Normal pulses.     Heart sounds: Normal heart sounds, S1 normal and S2 normal.  Pulmonary:     Effort: Pulmonary effort is normal.     Breath sounds: Normal breath sounds.  Musculoskeletal:     Comments: Tenderness to chest wall approximately 1 inch below surgical scar with area of fullness  Skin:    General: Skin is warm and dry.  Neurological:     Mental Status: She is alert.     GCS: GCS eye subscore is 4. GCS verbal subscore is 5. GCS motor subscore is 6.  Psychiatric:        Speech: Speech normal.        Behavior: Behavior normal. Behavior is cooperative.    Results for orders placed or performed in visit on 12/31/21  POCT glycosylated hemoglobin (Hb A1C)  Result Value Ref Range   Hemoglobin A1C 6.8 (A) 4.0 - 5.6 %   HbA1c POC (<> result, manual entry)     HbA1c, POC (prediabetic range)     HbA1c, POC (controlled diabetic range)       Assessment and Plan:   Controlled type 2 diabetes mellitus without complication, without long-term current use of insulin ([place]) Improved Stop rybelsus due to cost We will resume amaryl 2 mg that she was previously on -- hypoglycemia precautions advised Follow-up in 3 months, sooner if concerns  Breast pain Unclear etiology No red flags indicating cardiac etiology at this time Due to breast cancer history will obtain u/s imaging and go from there She declines pain medication and will trial gabapentin to see if this  helps and let us know if not  Inda Coke, PA-C

## 2022-01-01 ENCOUNTER — Encounter: Payer: Self-pay | Admitting: Physician Assistant

## 2022-01-01 DIAGNOSIS — R6889 Other general symptoms and signs: Secondary | ICD-10-CM

## 2022-01-02 NOTE — Telephone Encounter (Signed)
Please advise where to send referral?

## 2022-01-11 ENCOUNTER — Other Ambulatory Visit: Payer: Self-pay | Admitting: *Deleted

## 2022-01-11 ENCOUNTER — Encounter: Payer: Self-pay | Admitting: Physician Assistant

## 2022-01-11 MED ORDER — ROSUVASTATIN CALCIUM 5 MG PO TABS: ORAL_TABLET | ORAL | 1 refills | Status: DC

## 2022-01-14 ENCOUNTER — Ambulatory Visit
Admission: RE | Admit: 2022-01-14 | Discharge: 2022-01-14 | Disposition: A | Discharge status: Home / Self Care | Payer: Medicare Other | Source: Ambulatory Visit | Attending: Physician Assistant | Admitting: Physician Assistant

## 2022-01-14 ENCOUNTER — Encounter: Payer: Self-pay | Admitting: *Deleted

## 2022-01-14 DIAGNOSIS — N644 Mastodynia: Secondary | ICD-10-CM

## 2022-01-14 NOTE — Progress Notes (Unsigned)
[facility] [place]  ASSESSMENT / PLAN: Kyra Laffey is a 71 y.o. female without hemodynamically significant arterial disease by clinical exam or noninvasive testing.  Her abnormal toe pressure is not clinically significant given her lack of symptoms.  I encouraged her to continue following up with her orthopedist.  She can follow-up with me on an as-needed basis  CHIEF COMPLAINT: Right knee pain  HISTORY OF PRESENT ILLNESS: Shanikqua Zarzycki is a 71 y.o. female referred to clinic for evaluation of possible peripheral arterial disease.  Noninvasive testing performed by her primary care physician suggested an abnormal toe pressure.  She was referred for further evaluation.  On history, the patient reports no cramping discomfort in her calves with ambulation.  She has point tenderness to her knee joint, and notes this is related to increasing activity.  She has no discomfort at rest.  She has no symptoms of ischemic rest pain in the feet.  She has no ulcers about her feet.  VASCULAR SURGICAL HISTORY: none  VASCULAR RISK FACTORS: Negative history of stroke / transient ischemic attack. Negative history of coronary artery disease.  Positive history of diabetes mellitus.  Positive history of smoking. not actively smoking. Positive history of hypertension.  Negative history of chronic kidney disease.   Negative history of chronic obstructive pulmonary disease.  FUNCTIONAL STATUS: ECOG performance status: (1) Restricted in physically strenuous activity, ambulatory and able to do work of light nature Ambulatory status: Ambulatory within the community with limits  CAREY 1 AND 3 YEAR INDEX Female (2pts) 75-79 or 80-84 (2pts) >84 (3pts) Dependence in toileting (1pt) Partial or full dependence in dressing (1pt) History of malignant neoplasm (2pts) CHF (3pts) COPD (1pts) CKD (3pts)  0-3 pts 6% 1 year mortality ; 21% 3 year mortality 4-5 pts 12% 1 year mortality ; 36%  3 year mortality >5 pts 21% 1 year mortality; 54% 3 year mortality   Past Medical History:  Diagnosis Date   Breast cancer ([place]) 2011   Diabetes mellitus type 2, controlled ([place])    Heart murmur    Hyperlipidemia    Hypertension    Hypothyroidism     Past Surgical History:  Procedure Laterality Date   ABDOMINAL HYSTERECTOMY  2000   Still has ovaries   BELPHAROPTOSIS REPAIR  07/2019   BREAST SURGERY     CATARACT EXTRACTION  2019   Left eye 2019, Right eye 2020   CESAREAN SECTION  12/08/1990   MASTECTOMY  2011   Bilateral   ROBOTIC ASSISTED LAPAROSCOPIC VAGINAL HYSTERECTOMY WITH FIBROID REMOVAL  1991    Family History  Adopted: Yes  Problem Relation Age of Onset   Cancer Mother        small cell cancer of liver and lungs    Social History   Socioeconomic History   Marital status: Widowed    Spouse name: Not on file   Number of children: Not on file   Years of education: Not on file   Highest education level: Not on file  Occupational History   Occupation: Retired   Tobacco Use   Smoking status: Former    Types: Cigarettes    Start date: 02/07/1970   Smokeless tobacco: Never  Substance and Sexual Activity   Alcohol use: Yes    Alcohol/week: 3.0 standard drinks of alcohol    Types: 3 Glasses of wine per week   Drug use: Never   Sexual activity: Not on file  Other Topics Concern   Not  on file  Social History Narrative   Widow   Moved from [place] in about    Dentist -- former   Social Determinants of Health   Financial Resource Strain: Low Risk  (06/22/2021)   Overall Financial Resource Strain (CARDIA)    Difficulty of Paying Living Expenses: Not hard at all  Food Insecurity: No Food Insecurity (06/22/2021)   Hunger Vital Sign    Worried About Running Out of Food in the Last Year: Never true    Ran Out of Food in the Last Year: Never true  Transportation Needs: No Transportation Needs (06/22/2021)   PRAPARE - Radiographer, therapeutic (Medical): No    Lack of Transportation (Non-Medical): No  Physical Activity: Insufficiently Active (06/22/2021)   Exercise Vital Sign    Days of Exercise per Week: 2 days    Minutes of Exercise per Session: 60 min  Stress: No Stress Concern Present (06/22/2021)   [place]    Feeling of Stress : Not at all  Social Connections: Moderately Isolated (06/22/2021)   Social Connection and Isolation Panel [NHANES]    Frequency of Communication with Friends and Family: More than three times a week    Frequency of Social Gatherings with Friends and Family: More than three times a week    Attends Religious Services: More than 4 times per year    Active Member of [employer] or Organizations: No    Attends Archivist Meetings: Never    Marital Status: Widowed  Intimate Partner Violence: Not At Risk (06/22/2021)   Humiliation, Afraid, Rape, and Kick questionnaire    Fear of Current or Ex-Partner: No    Emotionally Abused: No    Physically Abused: No    Sexually Abused: No    Allergies  Allergen Reactions   Sulfa Antibiotics Rash   Metformin Diarrhea    Current Outpatient Medications  Medication Sig Dispense Refill   alendronate (FOSAMAX) 70 MG tablet Take 1 tablet (70 mg total) by mouth every 7 (seven) days. Take with a full glass of water on an empty stomach. 12 tablet 2   amLODipine (NORVASC) 5 MG tablet Take 1 tablet (5 mg total) by mouth daily. 90 tablet 3   calcium-vitamin D (OSCAL WITH D) 250-125 MG-UNIT tablet Take 1 tablet by mouth daily.     fluticasone (FLONASE) 50 MCG/ACT nasal spray SHAKE LIQUID AND USE 2 SPRAYS IN EACH NOSTRIL DAILY 16 g 2   gabapentin (NEURONTIN) 100 MG capsule Take one tablet ( 100 mg) by mouth one hour before bedtime every night. May increase by 100 mg nightly to goal of 300 mg. 60 capsule 1   glimepiride (AMARYL) 2 MG tablet Take 1 tablet (2 mg total) by mouth daily before  breakfast. 30 tablet 3   levothyroxine (SYNTHROID) 50 MCG tablet TAKE 1 TABLET(50 MCG) BY MOUTH DAILY 90 tablet 0   losartan-hydrochlorothiazide (HYZAAR) 100-12.5 MG tablet Take 1 tablet by mouth daily. 90 tablet 3   magnesium gluconate (MAGONATE) 500 MG tablet Take 500 mg by mouth 2 (two) times daily.     rosuvastatin (CRESTOR) 5 MG tablet TAKE 1 TABLET(5 MG) BY MOUTH DAILY 90 tablet 1   triamcinolone ointment (KENALOG) 0.5 % Use twice daily 15 g 0   No current facility-administered medications for this visit.    PHYSICAL EXAM Vitals:   01/15/22 0913  BP: (!) 145/81  Pulse: 88  Resp: 20  Temp:  98 F (36.7 C)  SpO2: 97%  Weight: 196 lb 11.2 oz (89.2 kg)  Height: '5\' 4"'$  (1.626 m)   Well-appearing elderly woman in no acute distress Regular rate and rhythm Unlabored breathing Easily palpable dorsalis pedis pulses bilaterally  Right upper extremity lymphedema Mild chronic venous insufficiency changes of the right lower extremity (hemosiderin dyspigmentation, scattered reticular veins)  PERTINENT LABORATORY AND RADIOLOGIC DATA  Most recent CBC    Latest Ref Rng & Units 08/10/2021    3:15 PM 01/24/2021   11:42 AM 01/18/2021    9:25 AM  CBC  WBC 3.8 - 10.8 Thousand/uL 7.8  10.4  6.7   Hemoglobin 11.7 - 15.5 g/dL 13.7  13.4  13.3   Hematocrit 35.0 - 45.0 % 41.5  40.2  40.4   Platelets 140 - 400 Thousand/uL 252  251.0  226.0      Most recent CMP    Latest Ref Rng & Units 08/10/2021    3:15 PM 01/18/2021    9:25 AM 07/26/2020    9:46 AM  CMP  Glucose 65 - 99 mg/dL 129  143  157   BUN 7 - 25 mg/dL '16  13  13   '$ Creatinine 0.60 - 1.00 mg/dL 0.69  0.68  0.72   Sodium 135 - 146 mmol/L 133  137  135   Potassium 3.5 - 5.3 mmol/L 4.1  4.0  4.0   Chloride 98 - 110 mmol/L 97  100  98   CO2 20 - 32 mmol/L '27  29  28   '$ Calcium 8.6 - 10.4 mg/dL 9.7  9.8  10.0   Total Protein 6.1 - 8.1 g/dL 7.2  7.3  6.9   Total Bilirubin 0.2 - 1.2 mg/dL 0.4  0.5  0.5   Alkaline Phos 39 - 117 U/L  34  35    AST 10 - 35 U/L '14  17  13   '$ ALT 6 - 29 U/L '12  15  12     '$ Renal function CrCl cannot be calculated (Patient's most recent lab result is older than the maximum 21 days allowed.).  Hemoglobin A1C  Date Value  12/31/2021 6.8 % (A)  11/05/2019 7.5   Hgb A1c MFr Bld (% of total Hgb)  Date Value  08/10/2021 7.7 (H)    LDL Cholesterol (Calc)  Date Value Ref Range Status  03/27/2020 75 mg/dL (calc) Final    Comment:    Reference range: <100 . Desirable range <100 mg/dL for primary prevention;   <70 mg/dL for patients with CHD or diabetic patients  with > or = 2 CHD risk factors. Marland Kitchen LDL-C is now calculated using the Martin-Hopkins  calculation, which is a validated novel method providing  better accuracy than the Friedewald equation in the  estimation of LDL-C.  Cresenciano Genre et al. Annamaria Helling. 8127;517(00): 2061-2068  (http://education.[facility].com/faq/FAQ164)    LDL Cholesterol  Date Value Ref Range Status  01/18/2021 82 0 - 99 mg/dL Final    +-------+-----------+-----------+------------+------------+  ABI/TBIToday's ABIToday's TBIPrevious ABIPrevious TBI  +-------+-----------+-----------+------------+------------+  Right  1.31       0.84                                 +-------+-----------+-----------+------------+------------+  Left   1.17       0.55                                 +-------+-----------+-----------+------------+------------+  [name]. Stanford Breed, MD [facility] Phone Number: [phone] 01/15/2022 9:35 AM  Total time spent on preparing this encounter including chart review, data review, collecting history, examining the patient, coordinating care for this new patient, 45 minutes.  Portions of this report may have been transcribed using voice recognition software.  Every effort has been made to ensure accuracy; however, inadvertent computerized transcription errors may still be present.

## 2022-01-15 ENCOUNTER — Ambulatory Visit: Payer: Medicare Other | Admitting: Vascular Surgery

## 2022-01-15 ENCOUNTER — Encounter: Payer: Self-pay | Admitting: Vascular Surgery

## 2022-01-15 VITALS — BP 145/81 | HR 88 | Temp 98.0°F | Resp 20 | Ht 64.0 in | Wt 196.7 lb

## 2022-01-15 DIAGNOSIS — R6889 Other general symptoms and signs: Secondary | ICD-10-CM | POA: Diagnosis not present

## 2022-02-26 ENCOUNTER — Other Ambulatory Visit: Payer: Self-pay | Admitting: Physician Assistant

## 2022-02-26 NOTE — Telephone Encounter (Signed)
Pt requesting refill of Gabapentin 100 mg. Last OV 12/2021

## 2022-03-04 ENCOUNTER — Ambulatory Visit (INDEPENDENT_AMBULATORY_CARE_PROVIDER_SITE_OTHER): Payer: Medicare Other | Admitting: Physician Assistant

## 2022-03-04 ENCOUNTER — Encounter: Payer: Self-pay | Admitting: Physician Assistant

## 2022-03-04 VITALS — BP 126/70 | HR 88 | Temp 98.4°F | Ht 64.0 in | Wt 197.4 lb

## 2022-03-04 DIAGNOSIS — L03113 Cellulitis of right upper limb: Secondary | ICD-10-CM

## 2022-03-04 DIAGNOSIS — E785 Hyperlipidemia, unspecified: Secondary | ICD-10-CM

## 2022-03-04 DIAGNOSIS — J029 Acute pharyngitis, unspecified: Secondary | ICD-10-CM

## 2022-03-04 DIAGNOSIS — E039 Hypothyroidism, unspecified: Secondary | ICD-10-CM | POA: Diagnosis not present

## 2022-03-04 DIAGNOSIS — E119 Type 2 diabetes mellitus without complications: Secondary | ICD-10-CM

## 2022-03-04 DIAGNOSIS — I10 Essential (primary) hypertension: Secondary | ICD-10-CM

## 2022-03-04 MED ORDER — CEPHALEXIN 500 MG PO CAPS: 500.0000 mg | ORAL_CAPSULE | Freq: Four times a day (QID) | ORAL | 0 refills | Status: DC

## 2022-03-04 NOTE — Progress Notes (Signed)
Amanda Rollins is a 71 y.o. female here for a follow up of a pre-existing problem.  History of Present Illness:   Chief Complaint  Patient presents with   Edema    Pt c/o edema right arm and hand started yesterday. Pt took an Aleve for pain. Pt had left over antibiotic Keflex was expired but took yesterday and today.   Sore Throat    Pt c/o sore throat started this morning.    HPI  Sore throat Patient reports sore throat over the day.  She denies any known sick contacts.  She did watch her grandson over the weekend and he does have a runny nose.  She denies fever, chills, nausea, vomiting.  Cellulitis/Edema  Patient complains of edema in her right arm and hand that started yesterday. She begin to have some burning and redness with her right arm. Has a sore throat Onset this morning. Denies any fever/chills. She took aleve and some left over antibiotic keflex that was expired.  She has taken at least a 5 to 6 capsules of this medication.  Diabetes Current DM meds: amaryl 2 mg daily.  She was taking Rybelsus 7 mg daily but this was unaffordable so she is no longer taking this.  Blood sugars at home are: not regularly checked. Patient is  compliant with medications. Denies: hypoglycemic or hyperglycemic episodes or symptoms. This patient's diabetes is complicated by HTN and HLD.   Hypothyroidism Patient is taking levothyroxine 50 mcg daily.  She is tolerating this well.  She is overdue her for her thyroid to be rechecked today.  Denies any concerns.  Hyperlipidemia She is currently taking Crestor 5 mg daily.  She is tolerating this well.  Denies any concerns with this medication.  HTN Currently taking losartan-hydrochlorothiazide 100-12.5 mg daily. At home blood pressure readings are: Not regularly checked. Patient denies chest pain, SOB, blurred vision, dizziness, unusual headaches, lower leg swelling. Patient is losartan hydrochlorothiazide 100-12.5 compliant with medication.  Denies excessive caffeine intake, stimulant usage, excessive alcohol intake, or increase in salt consumption.  BP Readings from Last 3 Encounters:  03/04/22 126/70  01/15/22 (!) 145/81  12/31/21 120/78     Past Medical History:  Diagnosis Date   Breast cancer ([place]) 2011   Diabetes mellitus type 2, controlled ([place])    Heart murmur    Hyperlipidemia    Hypertension    Hypothyroidism      Social History   Tobacco Use   Smoking status: Former    Types: Cigarettes    Start date: 02/07/1970   Smokeless tobacco: Never  Substance Use Topics   Alcohol use: Yes    Alcohol/week: 3.0 standard drinks of alcohol    Types: 3 Glasses of wine per week   Drug use: Never    Past Surgical History:  Procedure Laterality Date   ABDOMINAL HYSTERECTOMY  2000   Still has ovaries   BELPHAROPTOSIS REPAIR  07/2019   BREAST SURGERY     CATARACT EXTRACTION  2019   Left eye 2019, Right eye 2020   CESAREAN SECTION  12/08/1990   MASTECTOMY  2011   Bilateral   ROBOTIC ASSISTED LAPAROSCOPIC VAGINAL HYSTERECTOMY WITH FIBROID REMOVAL  1991    Family History  Adopted: Yes  Problem Relation Age of Onset   Cancer Mother        small cell cancer of liver and lungs    Allergies  Allergen Reactions   Sulfa Antibiotics Rash   Fosamax [Alendronate Sodium] Other (See  Comments)    Leg pain   Metformin Diarrhea    Current Medications:   Current Outpatient Medications:    alendronate (FOSAMAX) 70 MG tablet, Take 1 tablet (70 mg total) by mouth every 7 (seven) days. Take with a full glass of water on an empty stomach., Disp: 12 tablet, Rfl: 2   amLODipine (NORVASC) 5 MG tablet, Take 1 tablet (5 mg total) by mouth daily., Disp: 90 tablet, Rfl: 3   calcium-vitamin D (OSCAL WITH D) 250-125 MG-UNIT tablet, Take 1 tablet by mouth daily., Disp: , Rfl:    cephALEXin (KEFLEX) 500 MG capsule, Take 1 capsule (500 mg total) by mouth 4 (four) times daily., Disp: 40 capsule, Rfl: 0   fluticasone (FLONASE) 50  MCG/ACT nasal spray, SHAKE LIQUID AND USE 2 SPRAYS IN EACH NOSTRIL DAILY, Disp: 16 g, Rfl: 2   gabapentin (NEURONTIN) 100 MG capsule, TAKE 1 CAPSULE(100 MG) BY MOUTH EVERY NIGHT 1 HOUR BEFORE BEDTIME. MAY INCREASE BY 100 MG EVERY NIGHT TO GOAL OF 300 MG, Disp: 60 capsule, Rfl: 1   glimepiride (AMARYL) 2 MG tablet, Take 1 tablet (2 mg total) by mouth daily before breakfast., Disp: 30 tablet, Rfl: 3   levothyroxine (SYNTHROID) 50 MCG tablet, TAKE 1 TABLET(50 MCG) BY MOUTH DAILY, Disp: 90 tablet, Rfl: 0   losartan-hydrochlorothiazide (HYZAAR) 100-12.5 MG tablet, Take 1 tablet by mouth daily., Disp: 90 tablet, Rfl: 3   magnesium gluconate (MAGONATE) 500 MG tablet, Take 500 mg by mouth 2 (two) times daily., Disp: , Rfl:    rosuvastatin (CRESTOR) 5 MG tablet, TAKE 1 TABLET(5 MG) BY MOUTH DAILY, Disp: 90 tablet, Rfl: 1   triamcinolone ointment (KENALOG) 0.5 %, Use twice daily, Disp: 15 g, Rfl: 0   Review of Systems:   Review of Systems  Constitutional:  Negative for chills, fever, malaise/fatigue and weight loss.  HENT:  Positive for sore throat. Negative for hearing loss and sinus pain.   Respiratory:  Negative for cough and hemoptysis.   Cardiovascular:  Negative for chest pain, palpitations, leg swelling and PND.  Gastrointestinal:  Negative for abdominal pain, constipation, diarrhea, heartburn, nausea and vomiting.  Genitourinary:  Negative for dysuria, frequency and urgency.  Musculoskeletal:  Negative for back pain, myalgias and neck pain.  Skin:  Negative for itching and rash.  Neurological:  Negative for dizziness, tingling, seizures and headaches.  Endo/Heme/Allergies:  Negative for polydipsia.  Psychiatric/Behavioral:  Negative for depression. The patient is not nervous/anxious.     Vitals:   Vitals:   03/04/22 1524  BP: 126/70  Pulse: 88  Temp: 98.4 F (36.9 C)  TempSrc: Temporal  SpO2: 94%  Weight: 197 lb 6.1 oz (89.5 kg)  Height: '5\' 4"'$  (1.626 m)     Body mass index is  33.88 kg/m.  Physical Exam:   Physical Exam Vitals and nursing note reviewed.  Constitutional:      General: She is not in acute distress.    Appearance: She is well-developed. She is not ill-appearing or toxic-appearing.  HENT:     Head: Normocephalic and atraumatic.     Right Ear: Tympanic membrane, ear canal and external ear normal. Tympanic membrane is not erythematous, retracted or bulging.     Left Ear: Tympanic membrane, ear canal and external ear normal. Tympanic membrane is not erythematous, retracted or bulging.     Nose: Nose normal.     Right Sinus: No maxillary sinus tenderness or frontal sinus tenderness.     Left Sinus: No maxillary sinus tenderness  or frontal sinus tenderness.     Mouth/Throat:     Pharynx: Uvula midline. No posterior oropharyngeal erythema.  Eyes:     General: Lids are normal.     Conjunctiva/sclera: Conjunctivae normal.  Neck:     Trachea: Trachea normal.  Cardiovascular:     Rate and Rhythm: Normal rate and regular rhythm.     Pulses: Normal pulses.     Heart sounds: Normal heart sounds, S1 normal and S2 normal.  Pulmonary:     Effort: Pulmonary effort is normal.     Breath sounds: Normal breath sounds. No decreased breath sounds, wheezing, rhonchi or rales.  Musculoskeletal:     Comments: Diffuse erythema and swelling to r hand, arm  No TTP  Lymphadenopathy:     Cervical: No cervical adenopathy.  Skin:    General: Skin is warm and dry.  Neurological:     Mental Status: She is alert.     GCS: GCS eye subscore is 4. GCS verbal subscore is 5. GCS motor subscore is 6.  Psychiatric:        Speech: Speech normal.        Behavior: Behavior normal. Behavior is cooperative.     Assessment and Plan:   Controlled type 2 diabetes mellitus without complication, without long-term current use of insulin (HCC) Update A1c today Will adjust medications accordingly We do have limited options due to cost for her We will continue Amaryl 2 mg  daily Follow-up in 6 months, sooner if concerns or A1c is poorly controlled  Cellulitis of right upper extremity She appears nontoxic and vitals are stable We will start Keflex 500 mg 4 times daily x7 days  We will also update a white blood cell count  She is usually responded to this regimen well in the past  Worsening precautions advised   Acquired hypothyroidism Update TSH and adjust levothyroxine 50 mcg accordingly  Primary hypertension Normotensive Continue losartan hydrochlorothiazide 100-12.5 mg daily Follow-up in 6 months, sooner if concerns  Hyperlipidemia, unspecified hyperlipidemia type Update lipid panel and adjust rosuvastatin 5 mg daily  Sore throat Suspect viral URI She declined strep swab at today's visit knowing that cephalexin can help cover for strep  I,Moesha Myer,acting as a scribe for Inda Coke, PA.,have documented all relevant documentation on the behalf of Inda Coke, PA,as directed by  Inda Coke, PA while in the presence of Inda Coke, [place].  I, Inda Coke, [place], have reviewed all documentation for this visit. The documentation on 03/04/22 for the exam, diagnosis, procedures, and orders are all accurate and complete.  Inda Coke PA-C

## 2022-03-05 ENCOUNTER — Ambulatory Visit: Payer: Medicare Other | Admitting: Physician Assistant

## 2022-03-05 LAB — COMPREHENSIVE METABOLIC PANEL
ALT: 13 U/L (ref 0–35)
AST: 13 U/L (ref 0–37)
Albumin: 4.1 g/dL (ref 3.5–5.2)
Alkaline Phosphatase: 54 U/L (ref 39–117)
BUN: 19 mg/dL (ref 6–23)
CO2: 27 mEq/L (ref 19–32)
Calcium: 9.5 mg/dL (ref 8.4–10.5)
Chloride: 98 mEq/L (ref 96–112)
Creatinine, Ser: 0.84 mg/dL (ref 0.40–1.20)
GFR: 68.9 mL/min (ref >60.00)
Glucose, Bld: 239 mg/dL — ABNORMAL HIGH (ref 70–99)
Potassium: 3.3 mEq/L — ABNORMAL LOW (ref 3.5–5.1)
Sodium: 134 mEq/L — ABNORMAL LOW (ref 135–145)
Total Bilirubin: 0.5 mg/dL (ref 0.2–1.2)
Total Protein: 7.3 g/dL (ref 6.0–8.3)

## 2022-03-05 LAB — LIPID PANEL
Cholesterol: 149 mg/dL (ref 0–200)
HDL: 53.2 mg/dL (ref >39.00)
LDL Cholesterol: 72 mg/dL (ref 0–99)
NonHDL: 95.32
Total CHOL/HDL Ratio: 3
Triglycerides: 119 mg/dL (ref 0.0–149.0)
VLDL: 23.8 mg/dL (ref 0.0–40.0)

## 2022-03-05 LAB — CBC WITH DIFFERENTIAL/PLATELET
Basophils Absolute: 0.1 10*3/uL (ref 0.0–0.1)
Basophils Relative: 1.2 % (ref 0.0–3.0)
Eosinophils Absolute: 0.1 10*3/uL (ref 0.0–0.7)
Eosinophils Relative: 0.9 % (ref 0.0–5.0)
HCT: 38.1 % (ref 36.0–46.0)
Hemoglobin: 12.6 g/dL (ref 12.0–15.0)
Lymphocytes Relative: 14.8 % (ref 12.0–46.0)
Lymphs Abs: 1.8 10*3/uL (ref 0.7–4.0)
MCHC: 33.1 g/dL (ref 30.0–36.0)
MCV: 85.2 fl (ref 78.0–100.0)
Monocytes Absolute: 1.1 10*3/uL — ABNORMAL HIGH (ref 0.1–1.0)
Monocytes Relative: 8.9 % (ref 3.0–12.0)
Neutro Abs: 8.8 10*3/uL — ABNORMAL HIGH (ref 1.4–7.7)
Neutrophils Relative %: 74.2 % (ref 43.0–77.0)
Platelets: 198 10*3/uL (ref 150.0–400.0)
RBC: 4.47 Mil/uL (ref 3.87–5.11)
RDW: 13.8 % (ref 11.5–15.5)
WBC: 11.9 10*3/uL — ABNORMAL HIGH (ref 4.0–10.5)

## 2022-03-05 LAB — TSH: TSH: 2.23 u[IU]/mL (ref 0.35–5.50)

## 2022-03-05 LAB — HEMOGLOBIN A1C: Hgb A1c MFr Bld: 9.4 % — ABNORMAL HIGH (ref 4.6–6.5)

## 2022-03-06 ENCOUNTER — Other Ambulatory Visit: Payer: Self-pay | Admitting: Physician Assistant

## 2022-03-06 ENCOUNTER — Encounter: Payer: Self-pay | Admitting: Physician Assistant

## 2022-03-06 MED ORDER — EMPAGLIFLOZIN 10 MG PO TABS: 10.0000 mg | ORAL_TABLET | Freq: Every day | ORAL | 0 refills | Status: DC

## 2022-03-09 ENCOUNTER — Other Ambulatory Visit: Payer: Self-pay | Admitting: Physician Assistant

## 2022-04-02 ENCOUNTER — Ambulatory Visit (INDEPENDENT_AMBULATORY_CARE_PROVIDER_SITE_OTHER)
Admission: RE | Admit: 2022-04-02 | Discharge: 2022-04-02 | Disposition: A | Discharge status: Home / Self Care | Payer: Medicare Other | Source: Ambulatory Visit | Attending: Physician Assistant | Admitting: Physician Assistant

## 2022-04-02 ENCOUNTER — Ambulatory Visit (INDEPENDENT_AMBULATORY_CARE_PROVIDER_SITE_OTHER): Payer: Medicare Other | Admitting: Physician Assistant

## 2022-04-02 ENCOUNTER — Other Ambulatory Visit: Payer: Self-pay | Admitting: Physician Assistant

## 2022-04-02 ENCOUNTER — Encounter: Payer: Self-pay | Admitting: Physician Assistant

## 2022-04-02 VITALS — BP 120/62 | HR 88 | Temp 97.8°F | Ht 64.0 in | Wt 198.2 lb

## 2022-04-02 DIAGNOSIS — R051 Acute cough: Secondary | ICD-10-CM

## 2022-04-02 DIAGNOSIS — S2241XA Multiple fractures of ribs, right side, initial encounter for closed fracture: Secondary | ICD-10-CM | POA: Diagnosis not present

## 2022-04-02 DIAGNOSIS — R0781 Pleurodynia: Secondary | ICD-10-CM

## 2022-04-02 MED ORDER — BENZONATATE 100 MG PO CAPS: 100.0000 mg | ORAL_CAPSULE | Freq: Two times a day (BID) | ORAL | 0 refills | Status: DC | PRN

## 2022-04-02 NOTE — Patient Instructions (Addendum)
It was great to see you!  Upper respiratory infection recommendations for those with current or history of elevated blood pressure: 1. Avoid all over-the-counter antihistamines except Claritin/Loratadine and Zyrtec/Cetrizine. 2. Avoid all combination including cold sinus allergies flu decongestant and sleep medications 3. You can use Robitussin DM Mucinex and Mucinex DM for cough. I like 12-hour delsym (dextromethorphan) for your cough.  I have also sent in tessalon perles  An order for xray has been put in for you. To have this done, you can walk in at the [facility] location without a scheduled appointment.  The address is [address]. It is across the street from [facility]. Lab and x-xray are located in the basement.   Hours of operation are M-F 8:30am to 5:00pm.  Please note that they are closed for lunch between 12:30 and 1:00pm.  Follow-up if no improvement or new concerns!  Take care,  Inda Coke PA-C

## 2022-04-02 NOTE — Progress Notes (Signed)
Amanda Rollins is a 71 y.o. female here for a new problem.  History of Present Illness:   Chief Complaint  Patient presents with   Cough    Pt c/o cough, chest congestion x 3 weeks, expectorating clear to cloudy sputum. Also has pain in right rib area from coughing. Denies fever or chills. She has been taking Mucinex for cold & cough.    HPI  Cough Patient is complaining of a 3 week cough. She manages her symptoms with Mucinex, cough drops, and honey chamomile tea. She denies nasal congestion, ear pain, sore throat, fever, and chills.  Rib pain Patient is complaining of left rib pain. She pressed her rib firmly against her grandson's crib on Friday when laying him down to sleep. She has done this in the past but it was not as painful as this. She denies any bruising and redness to skin. She is taking tylenol and gabapentin as prescribed.   Past Medical History:  Diagnosis Date   Breast cancer ([place]) 2011   Diabetes mellitus type 2, controlled ([place])    Heart murmur    Hyperlipidemia    Hypertension    Hypothyroidism      Social History   Tobacco Use   Smoking status: Former    Types: Cigarettes    Start date: 02/07/1970   Smokeless tobacco: Never  Substance Use Topics   Alcohol use: Yes    Alcohol/week: 3.0 standard drinks of alcohol    Types: 3 Glasses of wine per week   Drug use: Never    Past Surgical History:  Procedure Laterality Date   ABDOMINAL HYSTERECTOMY  2000   Still has ovaries   BELPHAROPTOSIS REPAIR  07/2019   BREAST SURGERY     CATARACT EXTRACTION  2019   Left eye 2019, Right eye 2020   CESAREAN SECTION  12/08/1990   MASTECTOMY  2011   Bilateral   ROBOTIC ASSISTED LAPAROSCOPIC VAGINAL HYSTERECTOMY WITH FIBROID REMOVAL  1991    Family History  Adopted: Yes  Problem Relation Age of Onset   Cancer Mother        small cell cancer of liver and lungs    Allergies  Allergen Reactions   Sulfa Antibiotics Rash   Metformin Diarrhea     Current Medications:   Current Outpatient Medications:    alendronate (FOSAMAX) 70 MG tablet, Take 70 mg by mouth once a week. Take with a full glass of water on an empty stomach., Disp: , Rfl:    amLODipine (NORVASC) 5 MG tablet, Take 1 tablet (5 mg total) by mouth daily., Disp: 90 tablet, Rfl: 3   benzonatate (TESSALON) 100 MG capsule, Take 1 capsule (100 mg total) by mouth 2 (two) times daily as needed for cough., Disp: 20 capsule, Rfl: 0   calcium-vitamin D (OSCAL WITH D) 250-125 MG-UNIT tablet, Take 1 tablet by mouth daily., Disp: , Rfl:    empagliflozin (JARDIANCE) 10 MG TABS tablet, Take 1 tablet (10 mg total) by mouth daily before breakfast., Disp: 30 tablet, Rfl: 0   gabapentin (NEURONTIN) 100 MG capsule, TAKE 1 CAPSULE(100 MG) BY MOUTH EVERY NIGHT 1 HOUR BEFORE BEDTIME. MAY INCREASE BY 100 MG EVERY NIGHT TO GOAL OF 300 MG (Patient taking differently: 300 mg at bedtime. TAKE 1 CAPSULE(100 MG) BY MOUTH EVERY NIGHT 1 HOUR BEFORE BEDTIME. MAY INCREASE BY 100 MG EVERY NIGHT TO GOAL OF 300 MG), Disp: 60 capsule, Rfl: 1   levothyroxine (SYNTHROID) 50 MCG tablet, TAKE 1 TABLET(50 MCG)  BY MOUTH DAILY, Disp: 90 tablet, Rfl: 1   losartan-hydrochlorothiazide (HYZAAR) 100-12.5 MG tablet, Take 1 tablet by mouth daily., Disp: 90 tablet, Rfl: 3   magnesium gluconate (MAGONATE) 500 MG tablet, Take 500 mg by mouth 2 (two) times daily., Disp: , Rfl:    rosuvastatin (CRESTOR) 5 MG tablet, TAKE 1 TABLET(5 MG) BY MOUTH DAILY, Disp: 90 tablet, Rfl: 1   triamcinolone ointment (KENALOG) 0.5 %, Use twice daily, Disp: 15 g, Rfl: 0   fluticasone (FLONASE) 50 MCG/ACT nasal spray, SHAKE LIQUID AND USE 2 SPRAYS IN EACH NOSTRIL DAILY (Patient not taking: Reported on 04/02/2022), Disp: 16 g, Rfl: 2   Review of Systems:   Review of Systems  Constitutional:  Negative for chills and fever.  HENT:  Negative for congestion, ear pain and sore throat.   Respiratory:  Positive for cough.   Musculoskeletal:        (+)  left rib pain    Vitals:   Vitals:   04/02/22 1007  BP: 120/62  Pulse: 88  Temp: 97.8 F (36.6 C)  TempSrc: Temporal  SpO2: 98%  Weight: 198 lb 4 oz (89.9 kg)  Height: '5\' 4"'$  (1.626 m)     Body mass index is 34.03 kg/m.  Physical Exam:   Physical Exam Vitals and nursing note reviewed.  Constitutional:      General: She is not in acute distress.    Appearance: Normal appearance. She is well-developed. She is not ill-appearing or toxic-appearing.  HENT:     Head: Normocephalic and atraumatic.     Right Ear: Tympanic membrane, ear canal and external ear normal. Tympanic membrane is not erythematous, retracted or bulging.     Left Ear: Tympanic membrane, ear canal and external ear normal. Tympanic membrane is not erythematous, retracted or bulging.     Nose: Nose normal.     Right Sinus: No maxillary sinus tenderness or frontal sinus tenderness.     Left Sinus: No maxillary sinus tenderness or frontal sinus tenderness.     Mouth/Throat:     Pharynx: Uvula midline. No posterior oropharyngeal erythema.  Eyes:     General: Lids are normal.     Extraocular Movements: Extraocular movements intact.     Conjunctiva/sclera: Conjunctivae normal.     Pupils: Pupils are equal, round, and reactive to light.  Neck:     Trachea: Trachea normal.  Cardiovascular:     Rate and Rhythm: Normal rate and regular rhythm.     Heart sounds: Normal heart sounds, S1 normal and S2 normal. No murmur heard.    No gallop.  Pulmonary:     Effort: Pulmonary effort is normal. No respiratory distress.     Breath sounds: Normal breath sounds. No decreased breath sounds, wheezing, rhonchi or rales.  Chest:     Comments: R lateral lower rib TTP without obvious step-off deformity or skin changes Lymphadenopathy:     Cervical: No cervical adenopathy.  Skin:    General: Skin is warm and dry.  Neurological:     Mental Status: She is alert and oriented to person, place, and time.  Psychiatric:         Speech: Speech normal.        Behavior: Behavior normal. Behavior is cooperative.        Judgment: Judgment normal.     Assessment and Plan:   Rib pain on right side Suspect possible rib fracture Xray ordered Recommend Aleve -- she has this at home Continue gabapentin Discussed need  for deep breaths to prevent PNA/atelectasis Consider tylenol #3 if needed  Acute cough No red flags Start delsym and tessalon perles prn Lungs clear on my exam, however we are getting CXR for the above issue Follow-up if new/worsening sx  I,Verona Buck,acting as a scribe for [employer], PA.,have documented all relevant documentation on the behalf of Inda Coke, PA,as directed by  Inda Coke, PA while in the presence of Inda Coke, [place].  I, Inda Coke, [place], have reviewed all documentation for this visit. The documentation on 04/02/22 for the exam, diagnosis, procedures, and orders are all accurate and complete.   Inda Coke, PA-C

## 2022-04-03 ENCOUNTER — Other Ambulatory Visit: Payer: Self-pay | Admitting: *Deleted

## 2022-04-03 ENCOUNTER — Other Ambulatory Visit: Payer: Self-pay | Admitting: Physician Assistant

## 2022-04-03 DIAGNOSIS — S2241XA Multiple fractures of ribs, right side, initial encounter for closed fracture: Secondary | ICD-10-CM

## 2022-04-09 NOTE — Progress Notes (Unsigned)
    [name] D.[place] Phone: [phone]   Assessment and Plan:     There are no diagnoses linked to this encounter.  ***   Pertinent previous records reviewed include ***   Follow Up: ***     Subjective:   I, Amanda Rollins, am serving as a Education administrator for Amanda Rollins  Chief Complaint: rib pain   HPI:  04/10/2022 Patient is a 71 year old female complaining of rib pain. Patient states   Relevant Historical Information: ***  Additional pertinent review of systems negative.   Current Outpatient Medications:    alendronate (FOSAMAX) 70 MG tablet, Take 70 mg by mouth once a week. Take with a full glass of water on an empty stomach., Disp: , Rfl:    amLODipine (NORVASC) 5 MG tablet, Take 1 tablet (5 mg total) by mouth daily., Disp: 90 tablet, Rfl: 3   benzonatate (TESSALON) 100 MG capsule, Take 1 capsule (100 mg total) by mouth 2 (two) times daily as needed for cough., Disp: 20 capsule, Rfl: 0   calcium-vitamin D (OSCAL WITH D) 250-125 MG-UNIT tablet, Take 1 tablet by mouth daily., Disp: , Rfl:    empagliflozin (JARDIANCE) 10 MG TABS tablet, TAKE 1 TABLET(10 MG) BY MOUTH DAILY BEFORE BREAKFAST, Disp: 30 tablet, Rfl: 5   fluticasone (FLONASE) 50 MCG/ACT nasal spray, SHAKE LIQUID AND USE 2 SPRAYS IN EACH NOSTRIL DAILY (Patient not taking: Reported on 04/02/2022), Disp: 16 g, Rfl: 2   gabapentin (NEURONTIN) 300 MG capsule, Take 1 capsule (300 mg total) by mouth at bedtime., Disp: 90 capsule, Rfl: 1   levothyroxine (SYNTHROID) 50 MCG tablet, TAKE 1 TABLET(50 MCG) BY MOUTH DAILY, Disp: 90 tablet, Rfl: 1   losartan-hydrochlorothiazide (HYZAAR) 100-12.5 MG tablet, Take 1 tablet by mouth daily., Disp: 90 tablet, Rfl: 3   magnesium gluconate (MAGONATE) 500 MG tablet, Take 500 mg by mouth 2 (two) times daily., Disp: , Rfl:    rosuvastatin (CRESTOR) 5 MG tablet, TAKE 1 TABLET(5 MG) BY MOUTH DAILY, Disp:  90 tablet, Rfl: 1   triamcinolone ointment (KENALOG) 0.5 %, Use twice daily, Disp: 15 g, Rfl: 0   Objective:     There were no vitals filed for this visit.    There is no height or weight on file to calculate BMI.    Physical Exam:    ***   Electronically signed by:  [name] D.[facility] 7:46 AM 04/09/22

## 2022-04-10 ENCOUNTER — Ambulatory Visit: Payer: Medicare Other | Admitting: Sports Medicine

## 2022-04-10 VITALS — BP 118/80 | HR 78 | Ht 64.0 in | Wt 193.0 lb

## 2022-04-10 DIAGNOSIS — M810 Age-related osteoporosis without current pathological fracture: Secondary | ICD-10-CM | POA: Insufficient documentation

## 2022-04-10 DIAGNOSIS — S2241XA Multiple fractures of ribs, right side, initial encounter for closed fracture: Secondary | ICD-10-CM | POA: Diagnosis not present

## 2022-04-10 DIAGNOSIS — R61 Generalized hyperhidrosis: Secondary | ICD-10-CM | POA: Diagnosis not present

## 2022-04-10 DIAGNOSIS — Z853 Personal history of malignant neoplasm of breast: Secondary | ICD-10-CM | POA: Diagnosis not present

## 2022-04-10 NOTE — Patient Instructions (Signed)
Good to see you Tylenol [phone] mg 2-3 times a day for pain relief  Recommend relative rest no lifting more than 15 pounds  Chest MRI referral

## 2022-04-11 NOTE — Addendum Note (Signed)
Addended by: Douglass Rivers T on: 04/11/2022 02:31 PM   Modules accepted: Orders

## 2022-04-18 ENCOUNTER — Encounter: Payer: Self-pay | Admitting: Sports Medicine

## 2022-04-23 ENCOUNTER — Ambulatory Visit (INDEPENDENT_AMBULATORY_CARE_PROVIDER_SITE_OTHER): Payer: BLUE CROSS/BLUE SHIELD

## 2022-04-23 ENCOUNTER — Encounter: Payer: Self-pay | Admitting: Sports Medicine

## 2022-04-23 DIAGNOSIS — Z853 Personal history of malignant neoplasm of breast: Secondary | ICD-10-CM

## 2022-04-23 DIAGNOSIS — S2241XA Multiple fractures of ribs, right side, initial encounter for closed fracture: Secondary | ICD-10-CM | POA: Diagnosis not present

## 2022-04-23 DIAGNOSIS — R918 Other nonspecific abnormal finding of lung field: Secondary | ICD-10-CM | POA: Diagnosis not present

## 2022-04-23 MED ORDER — IOHEXOL 300 MG/ML  SOLN
75.0000 mL | Freq: Once | INTRAMUSCULAR | Status: AC | PRN
  Administered 2022-04-23: 75 mL via INTRAVENOUS

## 2022-04-24 NOTE — Progress Notes (Unsigned)
    [name] D.[place] Phone: [phone]   Assessment and Plan:     There are no diagnoses linked to this encounter.  ***   Pertinent previous records reviewed include ***   Follow Up: ***     Subjective:   I, Zienna Ahlin, am serving as a Education administrator for Doctor Glennon Mac   Chief Complaint: right rib pain    HPI:  04/10/2022 Patient is a 72 year old female complaining of rib pain. Patient states She was reaching over to put her grandson in the crib on Friday when laying him down to sleep. She has done this in the past but it was not as painful as this. She denies any bruising and redness to skin. She is taking tylenol and gabapentin as prescribe , this has happened before 2016 . Hx of breast cancer 2011   04/25/2022 Patient states   Relevant Historical Information: Breast cancer treated with chemo and radiation in 2011, hypertension, DM type II  Additional pertinent review of systems negative.   Current Outpatient Medications:    alendronate (FOSAMAX) 70 MG tablet, Take 70 mg by mouth once a week. Take with a full glass of water on an empty stomach., Disp: , Rfl:    amLODipine (NORVASC) 5 MG tablet, Take 1 tablet (5 mg total) by mouth daily., Disp: 90 tablet, Rfl: 3   benzonatate (TESSALON) 100 MG capsule, Take 1 capsule (100 mg total) by mouth 2 (two) times daily as needed for cough., Disp: 20 capsule, Rfl: 0   calcium-vitamin D (OSCAL WITH D) 250-125 MG-UNIT tablet, Take 1 tablet by mouth daily., Disp: , Rfl:    empagliflozin (JARDIANCE) 10 MG TABS tablet, TAKE 1 TABLET(10 MG) BY MOUTH DAILY BEFORE BREAKFAST, Disp: 30 tablet, Rfl: 5   fluticasone (FLONASE) 50 MCG/ACT nasal spray, SHAKE LIQUID AND USE 2 SPRAYS IN EACH NOSTRIL DAILY, Disp: 16 g, Rfl: 2   gabapentin (NEURONTIN) 300 MG capsule, Take 1 capsule (300 mg total) by mouth at bedtime., Disp: 90 capsule, Rfl: 1   levothyroxine  (SYNTHROID) 50 MCG tablet, TAKE 1 TABLET(50 MCG) BY MOUTH DAILY, Disp: 90 tablet, Rfl: 1   losartan-hydrochlorothiazide (HYZAAR) 100-12.5 MG tablet, Take 1 tablet by mouth daily., Disp: 90 tablet, Rfl: 3   magnesium gluconate (MAGONATE) 500 MG tablet, Take 500 mg by mouth 2 (two) times daily., Disp: , Rfl:    rosuvastatin (CRESTOR) 5 MG tablet, TAKE 1 TABLET(5 MG) BY MOUTH DAILY, Disp: 90 tablet, Rfl: 1   triamcinolone ointment (KENALOG) 0.5 %, Use twice daily, Disp: 15 g, Rfl: 0   Objective:     There were no vitals filed for this visit.    There is no height or weight on file to calculate BMI.    Physical Exam:    ***   Electronically signed by:  [name] D.[facility] 12:07 PM 04/24/22

## 2022-04-25 ENCOUNTER — Ambulatory Visit: Payer: Medicare Other | Admitting: Sports Medicine

## 2022-04-25 VITALS — BP 122/82 | HR 87 | Ht 64.0 in | Wt 194.0 lb

## 2022-04-25 DIAGNOSIS — R918 Other nonspecific abnormal finding of lung field: Secondary | ICD-10-CM

## 2022-04-25 DIAGNOSIS — S2241XA Multiple fractures of ribs, right side, initial encounter for closed fracture: Secondary | ICD-10-CM

## 2022-04-25 DIAGNOSIS — Z853 Personal history of malignant neoplasm of breast: Secondary | ICD-10-CM

## 2022-05-10 ENCOUNTER — Encounter: Payer: Self-pay | Admitting: Physician Assistant

## 2022-05-10 NOTE — Telephone Encounter (Signed)
Called patient for clarification on need for medication due to having 3 month supply w/ 1 refill sent in December 2023. Patient stated she had been taking 3 pills nightly thinking that each pill was 100 mg.   I informed patient that 300 mg tablets were sent in on 04/03/22. Pt verbalized understanding. States she will be calling pharmacy to see if refill can be filled as of now.

## 2022-05-10 NOTE — Telephone Encounter (Signed)
Noted  

## 2022-05-13 ENCOUNTER — Ambulatory Visit: Payer: Medicare Other | Admitting: Physician Assistant

## 2022-05-20 ENCOUNTER — Other Ambulatory Visit: Payer: Medicare Other

## 2022-06-13 ENCOUNTER — Other Ambulatory Visit: Payer: Self-pay | Admitting: Physician Assistant

## 2022-07-04 ENCOUNTER — Ambulatory Visit (INDEPENDENT_AMBULATORY_CARE_PROVIDER_SITE_OTHER): Payer: Medicare Other

## 2022-07-04 VITALS — BP 110/66 | HR 85 | Temp 97.3°F | Wt 193.0 lb

## 2022-07-04 DIAGNOSIS — Z0000 Encounter for general adult medical examination without abnormal findings: Secondary | ICD-10-CM

## 2022-07-04 NOTE — Progress Notes (Signed)
Subjective:   Amanda Rollins is a 72 y.o. female who presents for Medicare Annual (Subsequent) preventive examination.  Review of Systems     Cardiac Risk Factors include: advanced age (>8mn, >>33women);diabetes mellitus;dyslipidemia;hypertension;obesity (BMI >30kg/m2)     Objective:    Today's Vitals   07/04/22 0800  BP: 110/66  Pulse: 85  Temp: (!) 97.3 F (36.3 C)  SpO2: 94%  Weight: 193 lb (87.5 kg)   Body mass index is 33.13 kg/m.     07/04/2022    8:13 AM 06/22/2021    1:47 PM 02/28/2021    8:52 AM 05/26/2020    1:20 PM 04/11/2020   10:22 AM  Advanced Directives  Does Patient Have a Medical Advance Directive? Yes Yes No Yes Yes  Type of [place] Living will  Healthcare Power of [place] will;Out of facility DNR (pink MOST or yellow form)  Copy of HV[place] Chart? No - copy requested   No - copy requested   Would patient like information on creating a medical advance directive?   No - Patient declined      Current Medications (verified) Outpatient Encounter Medications as of 07/04/2022  Medication Sig   alendronate (FOSAMAX) 70 MG tablet Take 70 mg by mouth once a week. Take with a full glass of water on an empty stomach.   amLODipine (NORVASC) 5 MG tablet TAKE 1 TABLET(5 MG) BY MOUTH DAILY   calcium-vitamin D (OSCAL WITH D) 250-125 MG-UNIT tablet Take 1 tablet by mouth daily.   empagliflozin (JARDIANCE) 10 MG TABS tablet TAKE 1 TABLET(10 MG) BY MOUTH DAILY BEFORE BREAKFAST   fluticasone (FLONASE) 50 MCG/ACT nasal spray SHAKE LIQUID AND USE 2 SPRAYS IN EACH NOSTRIL DAILY   gabapentin (NEURONTIN) 300 MG capsule Take 1 capsule (300 mg total) by mouth at bedtime.   levothyroxine (SYNTHROID) 50 MCG tablet TAKE 1 TABLET(50 MCG) BY MOUTH DAILY   losartan-hydrochlorothiazide (HYZAAR) 100-12.5 MG tablet TAKE 1 TABLET BY MOUTH DAILY   magnesium gluconate (MAGONATE)  500 MG tablet Take 500 mg by mouth 2 (two) times daily.   rosuvastatin (CRESTOR) 5 MG tablet TAKE 1 TABLET(5 MG) BY MOUTH DAILY   triamcinolone ointment (KENALOG) 0.5 % Use twice daily (Patient not taking: Reported on 07/04/2022)   [DISCONTINUED] benzonatate (TESSALON) 100 MG capsule Take 1 capsule (100 mg total) by mouth 2 (two) times daily as needed for cough.   No facility-administered encounter medications on file as of 07/04/2022.    Allergies (verified) Sulfa antibiotics and Metformin   History: Past Medical History:  Diagnosis Date   Breast cancer ([place] 2011   Diabetes mellitus type 2, controlled ([place]    Heart murmur    Hyperlipidemia    Hypertension    Hypothyroidism    Past Surgical History:  Procedure Laterality Date   ABDOMINAL HYSTERECTOMY  2000   Still has ovaries   BELPHAROPTOSIS REPAIR  07/2019   BREAST SURGERY     CATARACT EXTRACTION  2019   Left eye 2019, Right eye 2020   CESAREAN SECTION  12/08/1990   MASTECTOMY  2011   Bilateral   ROBOTIC ASSISTED LAPAROSCOPIC VAGINAL HYSTERECTOMY WITH FIBROID REMOVAL  1991   Family History  Adopted: Yes  Problem Relation Age of Onset   Cancer Mother        small cell cancer of liver and lungs   Social History   Socioeconomic History   Marital status:  Widowed    Spouse name: Not on file   Number of children: Not on file   Years of education: Not on file   Highest education level: Not on file  Occupational History   Occupation: Retired   Tobacco Use   Smoking status: Former    Types: Cigarettes    Start date: 02/07/1970   Smokeless tobacco: Never  Substance and Sexual Activity   Alcohol use: Not Currently    Alcohol/week: 3.0 standard drinks of alcohol    Types: 3 Glasses of wine per week   Drug use: Never   Sexual activity: Not on file  Other Topics Concern   Not on file  Social History Narrative   Widow   Moved from [place] in about    Dentist -- former   Social Determinants of Health    Financial Resource Strain: Low Risk  (07/04/2022)   Overall Financial Resource Strain (CARDIA)    Difficulty of Paying Living Expenses: Not hard at all  Food Insecurity: No Food Insecurity (07/04/2022)   Hunger Vital Sign    Worried About Running Out of Food in the Last Year: Never true    [place] in the Last Year: Never true  Transportation Needs: No Transportation Needs (07/04/2022)   PRAPARE - Hydrologist (Medical): No    Lack of Transportation (Non-Medical): No  Physical Activity: Inactive (07/04/2022)   Exercise Vital Sign    Days of Exercise per Week: 0 days    Minutes of Exercise per Session: 0 min  Stress: No Stress Concern Present (07/04/2022)   [place]    Feeling of Stress : Not at all  Social Connections: Moderately Isolated (07/04/2022)   Social Connection and Isolation Panel [NHANES]    Frequency of Communication with Friends and Family: More than three times a week    Frequency of Social Gatherings with Friends and Family: More than three times a week    Attends Religious Services: More than 4 times per year    Active Member of [employer] or Organizations: No    Attends Archivist Meetings: Never    Marital Status: Widowed    Tobacco Counseling Counseling given: Not Answered   Clinical Intake:  Pre-visit preparation completed: Yes  Pain : No/denies pain     BMI - recorded: 33.13 Nutritional Status: BMI > 30  Obese Diabetes: Yes CBG done?: No Did pt. bring in CBG monitor from home?: No  How often do you need to have someone help you when you read instructions, pamphlets, or other written materials from your doctor or pharmacy?: 1 - Never  Diabetic?Nutrition Risk Assessment:  Has the patient had any N/V/D within the last 2 months?  No  Does the patient have any non-healing wounds?  No  Has the patient had any unintentional weight loss or weight  gain?  No   Diabetes:  Is the patient diabetic?  Yes  If diabetic, was a CBG obtained today?  No  Did the patient bring in their glucometer from home?  No  How often do you monitor your CBG's? N/a.   Financial Strains and Diabetes Management:  Are you having any financial strains with the device, your supplies or your medication? No .  Does the patient want to be seen by Chronic Care Management for management of their diabetes?  No  Would the patient like to be referred to a Nutritionist  or for Diabetic Management?  No   Diabetic Exams:  Diabetic Eye Exam: Completed 12/25/21 Diabetic Foot Exam: Overdue, Pt has been advised about the importance in completing this exam. Pt is scheduled for diabetic foot exam on next appt .    Interpreter Needed?: No  Information entered by :: Charlott Rakes, LPN   Activities of Daily Living    07/04/2022    8:13 AM  In your present state of health, do you have any difficulty performing the following activities:  Hearing? 0  Vision? 0  Difficulty concentrating or making decisions? 0  Walking or climbing stairs? 0  Dressing or bathing? 0  Doing errands, shopping? 0  Preparing Food and eating ? N  Using the Toilet? N  In the past six months, have you accidently leaked urine? N  Do you have problems with loss of bowel control? N  Managing your Medications? N  Managing your Finances? N  Housekeeping or managing your Housekeeping? N    Patient Care Team: Inda Coke, [place] as PCP - General (Physician Assistant)  Indicate any recent Medical Services you may have received from other than [facility] providers in the past year (date may be approximate).     Assessment:   This is a routine wellness examination for Amanda Rollins.  Hearing/Vision screen Hearing Screening - Comments:: Pt stated HOH  Vision Screening - Comments:: Pt will follow up with new provider   Dietary issues and exercise activities discussed: Current Exercise Habits: The patient  does not participate in regular exercise at present   Goals Addressed             This Visit's Progress    Patient Stated       Lose some more weight        Depression Screen    07/04/2022    8:11 AM 12/31/2021   10:26 AM 06/22/2021    1:46 PM 05/10/2021    8:42 AM 01/29/2021    9:05 AM 05/26/2020    1:16 PM 02/08/2020   10:41 AM  PHQ 2/9 Scores  PHQ - 2 Score 0 0 0 0 0 0 0  PHQ- 9 Score    1       Fall Risk    07/04/2022    8:13 AM 12/31/2021   10:26 AM 06/22/2021    1:47 PM 05/26/2020    1:21 PM 02/08/2020   10:40 AM  [place] in the past year? 0 0 0 1 0  Number falls in past yr: 0 0 0 1   Injury with Fall? 0 0 0 0   Risk for fall due to : Impaired vision    No Fall Risks  Follow up Falls prevention discussed  Falls prevention discussed Falls prevention discussed     FALL RISK PREVENTION PERTAINING TO THE HOME:  Any stairs in or around the home? No  If so, are there any without handrails? No  Home free of loose throw rugs in walkways, pet beds, electrical cords, etc? No  Adequate lighting in your home to reduce risk of falls? No   ASSISTIVE DEVICES UTILIZED TO PREVENT FALLS:  Life alert? No  Use of a cane, walker or w/c? No  Grab bars in the bathroom? No  Shower chair or bench in shower? No  Elevated toilet seat or a handicapped toilet? No   TIMED UP AND GO:  Was the test performed? Yes .  Length of time to ambulate 10 feet: 10  sec.   Gait steady and fast without use of assistive device  Cognitive Function:        07/04/2022    8:15 AM 06/22/2021    1:48 PM 05/26/2020    1:24 PM  6CIT Screen  What Year? 0 points 0 points 0 points  What month? 0 points 0 points 0 points  What time? 0 points 0 points   Count back from 20 0 points 0 points 0 points  Months in reverse 0 points 0 points 0 points  Repeat phrase 0 points 2 points 2 points  Total Score 0 points 2 points     Immunizations Immunization History  Administered Date(s) Administered    Fluad Quad(high Dose 65+) 04/07/2019, 03/27/2020, 01/18/2021   Influenza Split 02/05/2012   Moderna Sars-Covid-2 Vaccination 06/25/2019, 07/23/2019, 04/11/2020   Pneumococcal Conjugate-13 12/10/2017   Pneumococcal Polysaccharide-23 03/24/2019   Td 03/02/2010    TDAP status: Due, Education has been provided regarding the importance of this vaccine. Advised may receive this vaccine at local pharmacy or Health Dept. Aware to provide a copy of the vaccination record if obtained from local pharmacy or Health Dept. Verbalized acceptance and understanding.  Flu Vaccine status: Declined, Education has been provided regarding the importance of this vaccine but patient still declined. Advised may receive this vaccine at local pharmacy or Health Dept. Aware to provide a copy of the vaccination record if obtained from local pharmacy or Health Dept. Verbalized acceptance and understanding.  Pneumococcal vaccine status: Up to date  Covid-19 vaccine status: Completed vaccines  Qualifies for Shingles Vaccine? No    Screening Tests Health Maintenance  Topic Date Due   Diabetic kidney evaluation - Urine ACR  Never done   DTaP/Tdap/Td (2 - Tdap) 03/02/2020   FOOT EXAM  07/26/2021   INFLUENZA VACCINE  03/05/2023 (Originally 11/20/2021)   HEMOGLOBIN A1C  09/02/2022   OPHTHALMOLOGY EXAM  12/26/2022   Diabetic kidney evaluation - eGFR measurement  03/05/2023   Medicare Annual Wellness (AWV)  07/04/2023   Pneumonia Vaccine 51+ Years old  Completed   DEXA SCAN  Completed   Hepatitis C Screening  Completed   HPV VACCINES  Aged Out   MAMMOGRAM  Discontinued   COLONOSCOPY (Pts 45-33yr Insurance coverage will need to be confirmed)  Discontinued   COVID-19 Vaccine  Discontinued   Zoster Vaccines- Shingrix  Discontinued    Health Maintenance  Health Maintenance Due  Topic Date Due   Diabetic kidney evaluation - Urine ACR  Never done   DTaP/Tdap/Td (2 - Tdap) 03/02/2020   FOOT EXAM  07/26/2021     Colorectal cancer screening: No longer required.   Mammogram status: No longer required due to age .     Additional Screening:  Hepatitis C Screening:  Completed 05/05/20  Vision Screening: Recommended annual ophthalmology exams for early detection of glaucoma and other disorders of the eye. Is the patient up to date with their annual eye exam?  Yes  Who is the provider or what is the name of the office in which the patient attends annual eye exams? Will follow up with new provider  If pt is not established with a provider, would they like to be referred to a provider to establish care? No .   Dental Screening: Recommended annual dental exams for proper oral hygiene  Community Resource Referral / Chronic Care Management: CRR required this visit?  No   CCM required this visit?  No      Plan:  I have personally reviewed and noted the following in the patient's chart:   Medical and social history Use of alcohol, tobacco or illicit drugs  Current medications and supplements including opioid prescriptions. Patient is not currently taking opioid prescriptions. Functional ability and status Nutritional status Physical activity Advanced directives List of other physicians Hospitalizations, surgeries, and ER visits in previous 12 months Vitals Screenings to include cognitive, depression, and falls Referrals and appointments  In addition, I have reviewed and discussed with patient certain preventive protocols, quality metrics, and best practice recommendations. A written personalized care plan for preventive services as well as general preventive health recommendations were provided to patient.     Willette Brace, LPN   [id number]   Nurse Notes: none

## 2022-07-04 NOTE — Patient Instructions (Signed)
Amanda Rollins , Thank you for taking time to come for your Medicare Wellness Visit. I appreciate your ongoing commitment to your health goals. Please review the following plan we discussed and let me know if I can assist you in the future.   These are the goals we discussed:  Goals      Patient Stated     Lose weight      Patient Stated     Lose weight and stay active         This is a list of the screening recommended for you and due dates:  Health Maintenance  Topic Date Due   Yearly kidney health urinalysis for diabetes  Never done   DTaP/Tdap/Td vaccine (2 - Tdap) 03/02/2020   Complete foot exam   07/26/2021   Flu Shot  03/05/2023*   Hemoglobin A1C  09/02/2022   Eye exam for diabetics  12/26/2022   Yearly kidney function blood test for diabetes  03/05/2023   Medicare Annual Wellness Visit  07/04/2023   Pneumonia Vaccine  Completed   DEXA scan (bone density measurement)  Completed   Hepatitis C Screening: USPSTF Recommendation to screen - Ages 30-79 yo.  Completed   HPV Vaccine  Aged Out   Mammogram  Discontinued   Colon Cancer Screening  Discontinued   COVID-19 Vaccine  Discontinued   Zoster (Shingles) Vaccine  Discontinued  *Topic was postponed. The date shown is not the original due date.    Advanced directives: Please bring a copy of your health care power of attorney and living will to the office at your convenience.  Conditions/risks identified: lose some more weight   Next appointment: Follow up in one year for your annual wellness visit    Preventive Care 65 Years and Older, Female Preventive care refers to lifestyle choices and visits with your health care provider that can promote health and wellness. What does preventive care include? A yearly physical exam. This is also called an annual well check. Dental exams once or twice a year. Routine eye exams. Ask your health care provider how often you should have your eyes checked. Personal lifestyle choices,  including: Daily care of your teeth and gums. Regular physical activity. Eating a healthy diet. Avoiding tobacco and drug use. Limiting alcohol use. Practicing safe sex. Taking low-dose aspirin every day. Taking vitamin and mineral supplements as recommended by your health care provider. What happens during an annual well check? The services and screenings done by your health care provider during your annual well check will depend on your age, overall health, lifestyle risk factors, and family history of disease. Counseling  Your health care provider may ask you questions about your: Alcohol use. Tobacco use. Drug use. Emotional well-being. Home and relationship well-being. Sexual activity. Eating habits. History of falls. Memory and ability to understand (cognition). Work and work Statistician. Reproductive health. Screening  You may have the following tests or measurements: Height, weight, and BMI. Blood pressure. Lipid and cholesterol levels. These may be checked every 5 years, or more frequently if you are over 64 years old. Skin check. Lung cancer screening. You may have this screening every year starting at age 16 if you have a 30-pack-year history of smoking and currently smoke or have quit within the past 15 years. Fecal occult blood test (FOBT) of the stool. You may have this test every year starting at age 95. Flexible sigmoidoscopy or colonoscopy. You may have a sigmoidoscopy every 5 years or a colonoscopy every  10 years starting at age 60. Hepatitis C blood test. Hepatitis B blood test. Sexually transmitted disease (STD) testing. Diabetes screening. This is done by checking your blood sugar (glucose) after you have not eaten for a while (fasting). You may have this done every 1-3 years. Bone density scan. This is done to screen for osteoporosis. You may have this done starting at age 57. Mammogram. This may be done every 1-2 years. Talk to your health care provider  about how often you should have regular mammograms. Talk with your health care provider about your test results, treatment options, and if necessary, the need for more tests. Vaccines  Your health care provider may recommend certain vaccines, such as: Influenza vaccine. This is recommended every year. Tetanus, diphtheria, and acellular pertussis (Tdap, Td) vaccine. You may need a Td booster every 10 years. Zoster vaccine. You may need this after age 23. Pneumococcal 13-valent conjugate (PCV13) vaccine. One dose is recommended after age 53. Pneumococcal polysaccharide (PPSV23) vaccine. One dose is recommended after age 60. Talk to your health care provider about which screenings and vaccines you need and how often you need them. This information is not intended to replace advice given to you by your health care provider. Make sure you discuss any questions you have with your health care provider. Document Released: 05/05/2015 Document Revised: 12/27/2015 Document Reviewed: 02/07/2015 Elsevier Interactive Patient Education  2017 [place] Prevention in the Home Falls can cause injuries. They can happen to people of all ages. There are many things you can do to make your home safe and to help prevent falls. What can I do on the outside of my home? Regularly fix the edges of walkways and driveways and fix any cracks. Remove anything that might make you trip as you walk through a door, such as a raised step or threshold. Trim any bushes or trees on the path to your home. Use bright outdoor lighting. Clear any walking paths of anything that might make someone trip, such as rocks or tools. Regularly check to see if handrails are loose or broken. Make sure that both sides of any steps have handrails. Any raised decks and porches should have guardrails on the edges. Have any leaves, snow, or ice cleared regularly. Use sand or salt on walking paths during winter. Clean up any spills in  your garage right away. This includes oil or grease spills. What can I do in the bathroom? Use night lights. Install grab bars by the toilet and in the tub and shower. Do not use towel bars as grab bars. Use non-skid mats or decals in the tub or shower. If you need to sit down in the shower, use a plastic, non-slip stool. Keep the floor dry. Clean up any water that spills on the floor as soon as it happens. Remove soap buildup in the tub or shower regularly. Attach bath mats securely with double-sided non-slip rug tape. Do not have throw rugs and other things on the floor that can make you trip. What can I do in the bedroom? Use night lights. Make sure that you have a light by your bed that is easy to reach. Do not use any sheets or blankets that are too big for your bed. They should not hang down onto the floor. Have a firm chair that has side arms. You can use this for support while you get dressed. Do not have throw rugs and other things on the floor that can make you  trip. What can I do in the kitchen? Clean up any spills right away. Avoid walking on wet floors. Keep items that you use a lot in easy-to-reach places. If you need to reach something above you, use a strong step stool that has a grab bar. Keep electrical cords out of the way. Do not use floor polish or wax that makes floors slippery. If you must use wax, use non-skid floor wax. Do not have throw rugs and other things on the floor that can make you trip. What can I do with my stairs? Do not leave any items on the stairs. Make sure that there are handrails on both sides of the stairs and use them. Fix handrails that are broken or loose. Make sure that handrails are as long as the stairways. Check any carpeting to make sure that it is firmly attached to the stairs. Fix any carpet that is loose or worn. Avoid having throw rugs at the top or bottom of the stairs. If you do have throw rugs, attach them to the floor with carpet  tape. Make sure that you have a light switch at the top of the stairs and the bottom of the stairs. If you do not have them, ask someone to add them for you. What else can I do to help prevent falls? Wear shoes that: Do not have high heels. Have rubber bottoms. Are comfortable and fit you well. Are closed at the toe. Do not wear sandals. If you use a stepladder: Make sure that it is fully opened. Do not climb a closed stepladder. Make sure that both sides of the stepladder are locked into place. Ask someone to hold it for you, if possible. Clearly mark and make sure that you can see: Any grab bars or handrails. First and last steps. Where the edge of each step is. Use tools that help you move around (mobility aids) if they are needed. These include: Canes. Walkers. Scooters. Crutches. Turn on the lights when you go into a dark area. Replace any light bulbs as soon as they burn out. Set up your furniture so you have a clear path. Avoid moving your furniture around. If any of your floors are uneven, fix them. If there are any pets around you, be aware of where they are. Review your medicines with your doctor. Some medicines can make you feel dizzy. This can increase your chance of falling. Ask your doctor what other things that you can do to help prevent falls. This information is not intended to replace advice given to you by your health care provider. Make sure you discuss any questions you have with your health care provider. Document Released: 02/02/2009 Document Revised: 09/14/2015 Document Reviewed: 05/13/2014 Elsevier Interactive Patient Education  2017 [employer].

## 2022-07-08 ENCOUNTER — Ambulatory Visit (INDEPENDENT_AMBULATORY_CARE_PROVIDER_SITE_OTHER): Payer: Medicare Other | Admitting: Physician Assistant

## 2022-07-08 ENCOUNTER — Encounter: Payer: Self-pay | Admitting: Physician Assistant

## 2022-07-08 VITALS — BP 110/72 | HR 88 | Temp 97.3°F | Ht 64.0 in | Wt 193.5 lb

## 2022-07-08 DIAGNOSIS — J029 Acute pharyngitis, unspecified: Secondary | ICD-10-CM

## 2022-07-08 DIAGNOSIS — M810 Age-related osteoporosis without current pathological fracture: Secondary | ICD-10-CM

## 2022-07-08 LAB — POCT RAPID STREP A (OFFICE): Rapid Strep A Screen: POSITIVE — AB

## 2022-07-08 MED ORDER — AMOXICILLIN 500 MG PO CAPS: 500.0000 mg | ORAL_CAPSULE | Freq: Two times a day (BID) | ORAL | 0 refills | Status: AC

## 2022-07-08 MED ORDER — ALENDRONATE SODIUM 70 MG PO TABS: 70.0000 mg | ORAL_TABLET | ORAL | 3 refills | Status: DC

## 2022-07-08 NOTE — Progress Notes (Signed)
Amanda Rollins is a 72 y.o. female here for a new problem.  History of Present Illness:   Chief Complaint  Patient presents with   Eye Problem    Pt c/o bilateral eyes are red and irritated, eyes crusted shut this morning.    HPI  Pink eye:  She complains of a pinkeye starting Saturday (07/06/2022).  She states it started in the left eye and moved over to right eye.  She notes that her vision gets blurry until she cleans her eyes out. She endorses discomfort in her eyes.  Denies any pain in eyelids.   She reports having a cold and endorses a sore throat and runny nose.   Osteoporosis Patient reports consistent intake of Fosamax weekly She is due for bone density scan She has had recent rib fractures - had follow-up with sports medicine that requested CT scan which she did have that did not show any suspicious osseus abnormality    Past Medical History:  Diagnosis Date   Breast cancer ([place]) 2011   Diabetes mellitus type 2, controlled ([place])    Heart murmur    Hyperlipidemia    Hypertension    Hypothyroidism      Social History   Tobacco Use   Smoking status: Former    Types: Cigarettes    Start date: 02/07/1970   Smokeless tobacco: Never  Substance Use Topics   Alcohol use: Not Currently    Alcohol/week: 3.0 standard drinks of alcohol    Types: 3 Glasses of wine per week   Drug use: Never    Past Surgical History:  Procedure Laterality Date   ABDOMINAL HYSTERECTOMY  2000   Still has ovaries   BELPHAROPTOSIS REPAIR  07/2019   BREAST SURGERY     CATARACT EXTRACTION  2019   Left eye 2019, Right eye 2020   CESAREAN SECTION  12/08/1990   MASTECTOMY  2011   Bilateral   ROBOTIC ASSISTED LAPAROSCOPIC VAGINAL HYSTERECTOMY WITH FIBROID REMOVAL  1991    Family History  Adopted: Yes  Problem Relation Age of Onset   Cancer Mother        small cell cancer of liver and lungs    Allergies  Allergen Reactions   Sulfa Antibiotics Rash   Metformin Diarrhea     Current Medications:   Current Outpatient Medications:    alendronate (FOSAMAX) 70 MG tablet, Take 1 tablet (70 mg total) by mouth once a week. Take with a full glass of water on an empty stomach., Disp: 12 tablet, Rfl: 3   amLODipine (NORVASC) 5 MG tablet, TAKE 1 TABLET(5 MG) BY MOUTH DAILY, Disp: 90 tablet, Rfl: 1   calcium-vitamin D (OSCAL WITH D) 250-125 MG-UNIT tablet, Take 1 tablet by mouth daily., Disp: , Rfl:    empagliflozin (JARDIANCE) 10 MG TABS tablet, TAKE 1 TABLET(10 MG) BY MOUTH DAILY BEFORE BREAKFAST, Disp: 30 tablet, Rfl: 5   fluticasone (FLONASE) 50 MCG/ACT nasal spray, SHAKE LIQUID AND USE 2 SPRAYS IN EACH NOSTRIL DAILY, Disp: 16 g, Rfl: 2   gabapentin (NEURONTIN) 300 MG capsule, Take 1 capsule (300 mg total) by mouth at bedtime., Disp: 90 capsule, Rfl: 1   levothyroxine (SYNTHROID) 50 MCG tablet, TAKE 1 TABLET(50 MCG) BY MOUTH DAILY, Disp: 90 tablet, Rfl: 1   losartan-hydrochlorothiazide (HYZAAR) 100-12.5 MG tablet, TAKE 1 TABLET BY MOUTH DAILY, Disp: 90 tablet, Rfl: 1   magnesium gluconate (MAGONATE) 500 MG tablet, Take 500 mg by mouth 2 (two) times daily., Disp: , Rfl:  rosuvastatin (CRESTOR) 5 MG tablet, TAKE 1 TABLET(5 MG) BY MOUTH DAILY, Disp: 90 tablet, Rfl: 1   triamcinolone ointment (KENALOG) 0.5 %, Use twice daily, Disp: 15 g, Rfl: 0   Review of Systems:   Review of Systems  HENT:  Positive for sore throat.        (+) runny nose  Eyes:  Positive for blurred vision and redness (bilateral).    Vitals:   Vitals:   07/08/22 1036  BP: 110/72  Pulse: 88  Temp: (!) 97.3 F (36.3 C)  TempSrc: Temporal  SpO2: 98%  Weight: 193 lb 8 oz (87.8 kg)  Height: 5\' 4"  (1.626 m)     Body mass index is 33.21 kg/m.  Physical Exam:   Physical Exam Vitals and nursing note reviewed.  Constitutional:      General: She is not in acute distress.    Appearance: She is well-developed. She is not ill-appearing or toxic-appearing.  Eyes:     Conjunctiva/sclera:      Right eye: Right conjunctiva is injected.     Left eye: Left conjunctiva is injected.  Cardiovascular:     Rate and Rhythm: Normal rate and regular rhythm.     Pulses: Normal pulses.     Heart sounds: Normal heart sounds, S1 normal and S2 normal.  Pulmonary:     Effort: Pulmonary effort is normal.     Breath sounds: Normal breath sounds.  Skin:    General: Skin is warm and dry.  Neurological:     Mental Status: She is alert.     GCS: GCS eye subscore is 4. GCS verbal subscore is 5. GCS motor subscore is 6.  Psychiatric:        Speech: Speech normal.        Behavior: Behavior normal. Behavior is cooperative.    Results for orders placed or performed in visit on 07/08/22  POCT rapid strep A  Result Value Ref Range   Rapid Strep A Screen Positive (A) Negative    Assessment and Plan:   Sore throat No red flags on exam.  Will initiate amoxicillin 500 mg BID per orders. Discussed taking medications as prescribed. Reviewed return precautions including worsening fever, SOB, worsening cough or other concerns. Push fluids and rest. I recommend that patient follow-up if symptoms worsen or persist despite treatment x 7-10 days, sooner if needed.  Osteoporosis without current pathological fracture, unspecified osteoporosis type Due for repeat DEXA Continue Fosamax 70 mg weekly Will determine further osteoporosis management based on DEXA results and response to Fosamax  I,Rachel Rivera,acting as a scribe for [employer], PA.,have documented all relevant documentation on the behalf of Inda Coke, PA,as directed by  Inda Coke, PA while in the presence of Inda Coke, [place].  I, Inda Coke, [place], have reviewed all documentation for this visit. The documentation on 07/08/22 for the exam, diagnosis, procedures, and orders are all accurate and complete.  Inda Coke, PA-C

## 2022-07-08 NOTE — Patient Instructions (Signed)
It was great to see you!  Start amoxicillin for your strep and eye infection  You can schedule your bone density scan on the way out of our office.  Let's follow-up in 3 months for diabetes, sooner if you have concerns.  Take care,  Inda Coke PA-C

## 2022-07-09 ENCOUNTER — Ambulatory Visit (INDEPENDENT_AMBULATORY_CARE_PROVIDER_SITE_OTHER)
Admission: RE | Admit: 2022-07-09 | Discharge: 2022-07-09 | Disposition: A | Discharge status: Home / Self Care | Payer: Medicare Other | Source: Ambulatory Visit | Attending: Physician Assistant | Admitting: Physician Assistant

## 2022-07-09 DIAGNOSIS — M810 Age-related osteoporosis without current pathological fracture: Secondary | ICD-10-CM

## 2022-07-12 DIAGNOSIS — M810 Age-related osteoporosis without current pathological fracture: Secondary | ICD-10-CM | POA: Diagnosis not present

## 2022-07-13 ENCOUNTER — Other Ambulatory Visit: Payer: Self-pay | Admitting: Physician Assistant

## 2022-07-15 ENCOUNTER — Encounter: Payer: Self-pay | Admitting: Physician Assistant

## 2022-07-16 ENCOUNTER — Ambulatory Visit (INDEPENDENT_AMBULATORY_CARE_PROVIDER_SITE_OTHER): Payer: Medicare Other | Admitting: Physician Assistant

## 2022-07-16 ENCOUNTER — Ambulatory Visit (INDEPENDENT_AMBULATORY_CARE_PROVIDER_SITE_OTHER)
Admission: RE | Admit: 2022-07-16 | Discharge: 2022-07-16 | Disposition: A | Discharge status: Home / Self Care | Payer: Medicare Other | Source: Ambulatory Visit | Attending: Physician Assistant | Admitting: Physician Assistant

## 2022-07-16 ENCOUNTER — Encounter: Payer: Self-pay | Admitting: Physician Assistant

## 2022-07-16 VITALS — BP 120/70 | HR 79 | Temp 97.3°F | Ht 64.0 in | Wt 192.2 lb

## 2022-07-16 DIAGNOSIS — I10 Essential (primary) hypertension: Secondary | ICD-10-CM

## 2022-07-16 DIAGNOSIS — E785 Hyperlipidemia, unspecified: Secondary | ICD-10-CM

## 2022-07-16 DIAGNOSIS — R0781 Pleurodynia: Secondary | ICD-10-CM

## 2022-07-16 DIAGNOSIS — E119 Type 2 diabetes mellitus without complications: Secondary | ICD-10-CM | POA: Diagnosis not present

## 2022-07-16 DIAGNOSIS — M810 Age-related osteoporosis without current pathological fracture: Secondary | ICD-10-CM | POA: Diagnosis not present

## 2022-07-16 LAB — LIPID PANEL
Cholesterol: 182 mg/dL (ref 0–200)
HDL: 55.6 mg/dL (ref >39.00)
LDL Cholesterol: 87 mg/dL (ref 0–99)
NonHDL: 126.15
Total CHOL/HDL Ratio: 3
Triglycerides: 198 mg/dL — ABNORMAL HIGH (ref 0.0–149.0)
VLDL: 39.6 mg/dL (ref 0.0–40.0)

## 2022-07-16 LAB — CBC WITH DIFFERENTIAL/PLATELET
Basophils Absolute: 0.1 10*3/uL (ref 0.0–0.1)
Basophils Relative: 1 % (ref 0.0–3.0)
Eosinophils Absolute: 0.2 10*3/uL (ref 0.0–0.7)
Eosinophils Relative: 1.9 % (ref 0.0–5.0)
HCT: 43 % (ref 36.0–46.0)
Hemoglobin: 14.4 g/dL (ref 12.0–15.0)
Lymphocytes Relative: 20.2 % (ref 12.0–46.0)
Lymphs Abs: 1.6 10*3/uL (ref 0.7–4.0)
MCHC: 33.4 g/dL (ref 30.0–36.0)
MCV: 85.1 fl (ref 78.0–100.0)
Monocytes Absolute: 0.7 10*3/uL (ref 0.1–1.0)
Monocytes Relative: 8.9 % (ref 3.0–12.0)
Neutro Abs: 5.4 10*3/uL (ref 1.4–7.7)
Neutrophils Relative %: 68 % (ref 43.0–77.0)
Platelets: 220 10*3/uL (ref 150.0–400.0)
RBC: 5.05 Mil/uL (ref 3.87–5.11)
RDW: 14.1 % (ref 11.5–15.5)
WBC: 8 10*3/uL (ref 4.0–10.5)

## 2022-07-16 LAB — COMPREHENSIVE METABOLIC PANEL
ALT: 15 U/L (ref 0–35)
AST: 14 U/L (ref 0–37)
Albumin: 4.4 g/dL (ref 3.5–5.2)
Alkaline Phosphatase: 45 U/L (ref 39–117)
BUN: 21 mg/dL (ref 6–23)
CO2: 25 mEq/L (ref 19–32)
Calcium: 9.7 mg/dL (ref 8.4–10.5)
Chloride: 100 mEq/L (ref 96–112)
Creatinine, Ser: 0.72 mg/dL (ref 0.40–1.20)
GFR: 82.69 mL/min (ref >60.00)
Glucose, Bld: 273 mg/dL — ABNORMAL HIGH (ref 70–99)
Potassium: 3.8 mEq/L (ref 3.5–5.1)
Sodium: 136 mEq/L (ref 135–145)
Total Bilirubin: 0.5 mg/dL (ref 0.2–1.2)
Total Protein: 7.1 g/dL (ref 6.0–8.3)

## 2022-07-16 LAB — HEMOGLOBIN A1C: Hgb A1c MFr Bld: 11 % — ABNORMAL HIGH (ref 4.6–6.5)

## 2022-07-16 LAB — VITAMIN D 25 HYDROXY (VIT D DEFICIENCY, FRACTURES): VITD: 56.83 ng/mL (ref 30.00–100.00)

## 2022-07-16 NOTE — Progress Notes (Signed)
Amanda Rollins is a 72 y.o. female here for a new problem.  History of Present Illness:   Chief Complaint  Patient presents with   Rib Injury    Pt c/o left sided rib pain. She was putting grandchild in the crib and leaning over. Thinks fractured ribs. She took and Aleve last night.    HPI  Rib Injury:  She complains of left sided rib pain after leaning over a crib when putting her grandchild to bed.  She believes she may have fractured her ribs.  Has hx of rib fracture. She took Aleve last night to help with the pain.  She denies any bruising, redness, chest pain, shortness of breath, or cough.  Osteoporosis:  Her last bone density scan from 07/09/2022 showed worsening osteoporosis.  When she ran out of her Fosamax she did not refill her prescription for a couple of months.  She does admit to not taking any calcium supplementation She is taking approx 15,000 IU vit D Daily Reports childhood hx of "low calcium" and required calcium supplementation  Diabetes: She does not monitor her blood sugar at home.  She is compliant with Jardiance, 10 mg daily.   HTN Currently taking amlodipine 5 mg daily, losartan-hctz 100-25 mg. At home blood pressure readings are: not checked. Patient denies chest pain, SOB, blurred vision, dizziness, unusual headaches, lower leg swelling. Patient is compliant with medication. Denies excessive caffeine intake, stimulant usage, excessive alcohol intake, or increase in salt consumption.  BP Readings from Last 3 Encounters:  07/16/22 120/70  07/08/22 110/72  07/04/22 110/66   HLD Currently taking crestor 5 mg daily Tolerating well   Past Medical History:  Diagnosis Date   Breast cancer ([place]) 2011   Diabetes mellitus type 2, controlled ([place])    Heart murmur    Hyperlipidemia    Hypertension    Hypothyroidism      Social History   Tobacco Use   Smoking status: Former    Types: Cigarettes    Start date: 02/07/1970   Smokeless tobacco:  Never  Substance Use Topics   Alcohol use: Not Currently    Alcohol/week: 3.0 standard drinks of alcohol    Types: 3 Glasses of wine per week   Drug use: Never    Past Surgical History:  Procedure Laterality Date   ABDOMINAL HYSTERECTOMY  2000   Still has ovaries   BELPHAROPTOSIS REPAIR  07/2019   BREAST SURGERY     CATARACT EXTRACTION  2019   Left eye 2019, Right eye 2020   CESAREAN SECTION  12/08/1990   MASTECTOMY  2011   Bilateral   ROBOTIC ASSISTED LAPAROSCOPIC VAGINAL HYSTERECTOMY WITH FIBROID REMOVAL  1991    Family History  Adopted: Yes  Problem Relation Age of Onset   Cancer Mother        small cell cancer of liver and lungs    Allergies  Allergen Reactions   Sulfa Antibiotics Rash   Metformin Diarrhea    Current Medications:   Current Outpatient Medications:    alendronate (FOSAMAX) 70 MG tablet, Take 1 tablet (70 mg total) by mouth once a week. Take with a full glass of water on an empty stomach., Disp: 12 tablet, Rfl: 3   amLODipine (NORVASC) 5 MG tablet, TAKE 1 TABLET(5 MG) BY MOUTH DAILY, Disp: 90 tablet, Rfl: 1   amoxicillin (AMOXIL) 500 MG capsule, Take 1 capsule (500 mg total) by mouth 2 (two) times daily for 10 days., Disp: 20 capsule, Rfl: 0  calcium-vitamin D (OSCAL WITH D) 250-125 MG-UNIT tablet, Take 1 tablet by mouth daily., Disp: , Rfl:    empagliflozin (JARDIANCE) 10 MG TABS tablet, TAKE 1 TABLET(10 MG) BY MOUTH DAILY BEFORE BREAKFAST, Disp: 30 tablet, Rfl: 5   fluticasone (FLONASE) 50 MCG/ACT nasal spray, SHAKE LIQUID AND USE 2 SPRAYS IN EACH NOSTRIL DAILY, Disp: 16 g, Rfl: 2   gabapentin (NEURONTIN) 300 MG capsule, Take 1 capsule (300 mg total) by mouth at bedtime., Disp: 90 capsule, Rfl: 1   levothyroxine (SYNTHROID) 50 MCG tablet, TAKE 1 TABLET(50 MCG) BY MOUTH DAILY, Disp: 90 tablet, Rfl: 1   losartan-hydrochlorothiazide (HYZAAR) 100-12.5 MG tablet, TAKE 1 TABLET BY MOUTH DAILY, Disp: 90 tablet, Rfl: 1   magnesium gluconate (MAGONATE) 500  MG tablet, Take 500 mg by mouth 2 (two) times daily., Disp: , Rfl:    rosuvastatin (CRESTOR) 5 MG tablet, TAKE 1 TABLET(5 MG) BY MOUTH DAILY, Disp: 90 tablet, Rfl: 1   triamcinolone ointment (KENALOG) 0.5 %, Use twice daily, Disp: 15 g, Rfl: 0   Review of Systems:   Review of Systems  Genitourinary:  Positive for flank pain (left side - ribs).    Vitals:   Vitals:   07/16/22 0959  BP: 120/70  Pulse: 79  Temp: (!) 97.3 F (36.3 C)  TempSrc: Temporal  SpO2: 95%  Weight: 192 lb 4 oz (87.2 kg)  Height: 5\' 4"  (1.626 m)     Body mass index is 33 kg/m.  Physical Exam:   Physical Exam Vitals and nursing note reviewed.  Constitutional:      General: She is not in acute distress.    Appearance: She is well-developed. She is not ill-appearing or toxic-appearing.  Cardiovascular:     Rate and Rhythm: Normal rate and regular rhythm.     Pulses: Normal pulses.     Heart sounds: Normal heart sounds, S1 normal and S2 normal.  Pulmonary:     Effort: Pulmonary effort is normal.     Breath sounds: Normal breath sounds.  Skin:    General: Skin is warm and dry.  Neurological:     Mental Status: She is alert.     GCS: GCS eye subscore is 4. GCS verbal subscore is 5. GCS motor subscore is 6.  Psychiatric:        Speech: Speech normal.        Behavior: Behavior normal. Behavior is cooperative.     Assessment and Plan:   Osteoporosis, unspecified osteoporosis type, unspecified pathological fracture presence Reviewed results She wants to take fosamax more consistently to see if this helps as she was non-compliant with this in the past Update calcium and PTH -- has hx of low calcium in childhood - unsure of etiology of this Follow-up DXA in two years Start calcium supplement and continue Vit D supplement  Controlled type 2 diabetes mellitus without complication, without long-term current use of insulin (HCC) Update A1c and adjust regimen of jardiance 10 mg daily Follow-up based on  results  Primary hypertension Well controlled in office Continue amlodipine 5 mg daily, losartan-hctz 100-25 mg Follow-up based on results  Hyperlipidemia, unspecified hyperlipidemia type Update lipid panel and adjust crestor 5 mg daily as indicated  Rib pain on left side Will obtain xray No red flags Recommend ER if sudden onset chest pain/SOB Aleve prn for pain  I,Rachel Rivera,acting as a scribe for [employer], PA.,have documented all relevant documentation on the behalf of Inda Coke, PA,as directed by  Inda Coke, PA  while in the presence of Inda Coke, [place].  I, Inda Coke, [place], have reviewed all documentation for this visit. The documentation on 07/16/22 for the exam, diagnosis, procedures, and orders are all accurate and complete.  Inda Coke, PA-C

## 2022-07-17 ENCOUNTER — Encounter: Payer: Self-pay | Admitting: Physician Assistant

## 2022-07-17 DIAGNOSIS — E119 Type 2 diabetes mellitus without complications: Secondary | ICD-10-CM

## 2022-07-17 LAB — PTH, INTACT AND CALCIUM
Calcium: 9.9 mg/dL (ref 8.6–10.4)
PTH: 33 pg/mL (ref 16–77)

## 2022-07-22 ENCOUNTER — Ambulatory Visit (INDEPENDENT_AMBULATORY_CARE_PROVIDER_SITE_OTHER): Payer: Medicare Other | Admitting: Physician Assistant

## 2022-07-22 ENCOUNTER — Encounter: Payer: Self-pay | Admitting: Physician Assistant

## 2022-07-22 VITALS — BP 136/78 | HR 82 | Temp 98.0°F | Ht 64.0 in | Wt 195.0 lb

## 2022-07-22 DIAGNOSIS — R0781 Pleurodynia: Secondary | ICD-10-CM | POA: Diagnosis not present

## 2022-07-22 MED ORDER — GABAPENTIN 300 MG PO CAPS: 300.0000 mg | ORAL_CAPSULE | Freq: Every day | ORAL | 1 refills | Status: DC

## 2022-07-22 MED ORDER — MELOXICAM 15 MG PO TABS: 15.0000 mg | ORAL_TABLET | Freq: Every day | ORAL | 0 refills | Status: DC

## 2022-07-22 MED ORDER — BACLOFEN 10 MG PO TABS: 10.0000 mg | ORAL_TABLET | Freq: Every evening | ORAL | 0 refills | Status: DC | PRN

## 2022-07-22 NOTE — Progress Notes (Signed)
Amanda Rollins is a 72 y.o. female here for a follow up of a pre-existing problem.  History of Present Illness:   Chief Complaint  Patient presents with   Abdominal Pain    Pt still c/o left upper abd pain under left breast area. Taking Tylenol with some relief, icy hot last night with some relief.    HPI  Left rib pain: She complains of left rib pain under left breast area which as worsened in the last 3 days.  She reports her pain will at times radiate towards her back and endorses occasional burning sensation.  Pain intensifies with movement.  She is taking Tylenol and used Icy Hot last night with some relief.  She has not tried Ibuprofen.  Since her last visit on 07/16/2022 she had an xray which came back normal. She has been sleeping on her right side to avoid waking up with pain. Denies any cough or rash, exertional symptoms.  Past Medical History:  Diagnosis Date   Breast cancer 2011   Diabetes mellitus type 2, controlled    Heart murmur    Hyperlipidemia    Hypertension    Hypothyroidism      Social History   Tobacco Use   Smoking status: Former    Types: Cigarettes    Start date: 02/07/1970   Smokeless tobacco: Never  Substance Use Topics   Alcohol use: Not Currently    Alcohol/week: 3.0 standard drinks of alcohol    Types: 3 Glasses of wine per week   Drug use: Never    Past Surgical History:  Procedure Laterality Date   ABDOMINAL HYSTERECTOMY  2000   Still has ovaries   BELPHAROPTOSIS REPAIR  07/2019   BREAST SURGERY     CATARACT EXTRACTION  2019   Left eye 2019, Right eye 2020   CESAREAN SECTION  12/08/1990   MASTECTOMY  2011   Bilateral   ROBOTIC ASSISTED LAPAROSCOPIC VAGINAL HYSTERECTOMY WITH FIBROID REMOVAL  1991    Family History  Adopted: Yes  Problem Relation Age of Onset   Cancer Mother        small cell cancer of liver and lungs    Allergies  Allergen Reactions   Sulfa Antibiotics Rash   Metformin Diarrhea    Current  Medications:   Current Outpatient Medications:    alendronate (FOSAMAX) 70 MG tablet, Take 1 tablet (70 mg total) by mouth once a week. Take with a full glass of water on an empty stomach., Disp: 12 tablet, Rfl: 3   amLODipine (NORVASC) 5 MG tablet, TAKE 1 TABLET(5 MG) BY MOUTH DAILY, Disp: 90 tablet, Rfl: 1   baclofen (LIORESAL) 10 MG tablet, Take 1 tablet (10 mg total) by mouth at bedtime as needed for muscle spasms., Disp: 30 each, Rfl: 0   calcium-vitamin D (OSCAL WITH D) 250-125 MG-UNIT tablet, Take 1 tablet by mouth daily., Disp: , Rfl:    empagliflozin (JARDIANCE) 10 MG TABS tablet, TAKE 1 TABLET(10 MG) BY MOUTH DAILY BEFORE BREAKFAST, Disp: 30 tablet, Rfl: 5   fluticasone (FLONASE) 50 MCG/ACT nasal spray, SHAKE LIQUID AND USE 2 SPRAYS IN EACH NOSTRIL DAILY, Disp: 16 g, Rfl: 2   levothyroxine (SYNTHROID) 50 MCG tablet, TAKE 1 TABLET(50 MCG) BY MOUTH DAILY, Disp: 90 tablet, Rfl: 1   losartan-hydrochlorothiazide (HYZAAR) 100-12.5 MG tablet, TAKE 1 TABLET BY MOUTH DAILY, Disp: 90 tablet, Rfl: 1   magnesium gluconate (MAGONATE) 500 MG tablet, Take 500 mg by mouth 2 (two) times daily., Disp: ,  Rfl:    meloxicam (MOBIC) 15 MG tablet, Take 1 tablet (15 mg total) by mouth daily., Disp: 30 tablet, Rfl: 0   rosuvastatin (CRESTOR) 5 MG tablet, TAKE 1 TABLET(5 MG) BY MOUTH DAILY, Disp: 90 tablet, Rfl: 1   triamcinolone ointment (KENALOG) 0.5 %, Use twice daily, Disp: 15 g, Rfl: 0   gabapentin (NEURONTIN) 300 MG capsule, Take 1 capsule (300 mg total) by mouth at bedtime., Disp: 90 capsule, Rfl: 1   Review of Systems:   Review of Systems  Gastrointestinal:  Positive for abdominal pain (ULQ pain).    Vitals:   Vitals:   07/22/22 0907  BP: 136/78  Pulse: 82  Temp: 98 F (36.7 C)  TempSrc: Temporal  SpO2: 96%  Weight: 195 lb (88.5 kg)  Height: 5\' 4"  (1.626 m)     Body mass index is 33.47 kg/m.  Physical Exam:   Physical Exam Vitals and nursing note reviewed.  Constitutional:       General: She is not in acute distress.    Appearance: She is well-developed. She is not ill-appearing or toxic-appearing.  Cardiovascular:     Rate and Rhythm: Normal rate and regular rhythm.     Pulses: Normal pulses.     Heart sounds: Normal heart sounds, S1 normal and S2 normal.  Pulmonary:     Effort: Pulmonary effort is normal.     Breath sounds: Normal breath sounds.  Chest:     Comments: Tenderness to inferior aspect of left mastectomy scar No obvious bony deformity or rash Skin:    General: Skin is warm and dry.  Neurological:     Mental Status: She is alert.     GCS: GCS eye subscore is 4. GCS verbal subscore is 5. GCS motor subscore is 6.  Psychiatric:        Speech: Speech normal.        Behavior: Behavior normal. Behavior is cooperative.     Assessment and Plan:   Rib pain on left side No red flags on exam Suspect pain from earlier rib contusion [place] Criteria is 0 Recommend mobic 15 mg daily x 1-2 weeks, then prn after that May trial baclofen for sleep Discussed that if this persists, needs close follow-up with sports medicine Dr Hoover Browns Rivera,acting as a scribe for Inda Coke, PA.,have documented all relevant documentation on the behalf of Inda Coke, PA,as directed by  Inda Coke, PA while in the presence of Inda Coke, [place].  I, Inda Coke, [place], have reviewed all documentation for this visit. The documentation on 07/22/22 for the exam, diagnosis, procedures, and orders are all accurate and complete.  Inda Coke, PA-C

## 2022-08-14 ENCOUNTER — Ambulatory Visit: Payer: Medicare Other | Admitting: Physician Assistant

## 2022-08-18 ENCOUNTER — Other Ambulatory Visit: Payer: Self-pay | Admitting: Physician Assistant

## 2022-08-30 ENCOUNTER — Encounter: Payer: Medicare Other | Attending: Physician Assistant | Admitting: Dietician

## 2022-08-30 ENCOUNTER — Encounter: Payer: Self-pay | Admitting: Dietician

## 2022-08-30 VITALS — Ht 64.0 in | Wt 192.0 lb

## 2022-08-30 DIAGNOSIS — E119 Type 2 diabetes mellitus without complications: Secondary | ICD-10-CM | POA: Insufficient documentation

## 2022-08-30 NOTE — Progress Notes (Signed)
Diabetes Self-Management Education  Visit Type: First/Initial  Appt. Start Time: 0915 Appt. End Time: 1015  08/30/2022  [name], identified by name and date of birth, is a 72 y.o. female with a diagnosis of Diabetes: Type 2.   ASSESSMENT Patient is here today alone.  Patient states that she has been having problems with compulsive eating and eating more sugar recently and has been working on this. She got in the habit of eating when she was watching TV. She is not checking her blood glucose. AccuChek Guide blood glucose meter provided. Lot [id number] Expiration 03/04/2023 Patient demonstrated use although some difficulty due to dexterity.  Blood glucose 279 this am in the office after breakfast which contained about 30 grams of carbs.  Referral:  Type 2 diabetes  History includes Type 2 diabetes, HLD, HTN, breast cancer in remission. Lymphodema , hypothyroid  Medications include:  Jardiance, Rosvastatin, magnesium, calcium, vitamin D, synthroid Labs noted to include A1C 11% 07/16/2022 increased from 9.4% 03/04/2022, vitamin D 56 07/16/2022 Lipid Panel     Component Value Date/Time   CHOL 182 07/16/2022 1024   TRIG 198.0 (H) 07/16/2022 1024   HDL 55.60 07/16/2022 1024   CHOLHDL 3 07/16/2022 1024   VLDL 39.6 07/16/2022 1024   LDLCALC 87 07/16/2022 1024   LDLCALC 75 03/27/2020 1010    Patient lives alone.  Widow.  She moved from [facility]  from [place] about 2021. She is a retired Producer, television/film/video and middle school chorus, piano, and Tax adviser. Cares for her grandson every other day. Eats, chicken, [place], occasional beef. Some issues with knee and wants to avoid a knee replacement.  Shoulder issues. Took a class with a Charity fundraiser at the [employer] (nutrition and exercise)   Allergies:  raw spinach, grapes cause right abdominal pain.  Peanuts also cause the same issues at times.  Height 5\' 4"  (1.626 m), weight 192 lb (87.1 kg). Body mass index is 32.96 kg/m.   Diabetes  Self-Management Education - 08/30/22 0931       Visit Information   Visit Type First/Initial      Initial Visit   Diabetes Type Type 2    Date Diagnosed 11/2009    Are you currently following a meal plan? No    Are you taking your medications as prescribed? Yes      Health Coping   How would you rate your overall health? Good      Psychosocial Assessment   Patient Belief/Attitude about Diabetes Motivated to manage diabetes    What is the hardest part about your diabetes right now, causing you the most concern, or is the most worrisome to you about your diabetes?   Making healty food and beverage choices   portion size   Self-care barriers None    Self-management support Doctor's office    Other persons present Patient    Patient Concerns Nutrition/Meal planning;Weight Control;Glycemic Control;Healthy Lifestyle;Problem Solving;Monitoring    Special Needs None    Preferred Learning Style No preference indicated    Learning Readiness Ready    How often do you need to have someone help you when you read instructions, pamphlets, or other written materials from your doctor or pharmacy? 1 - Never    What is the last grade level you completed in school? Master's      Pre-Education Assessment   Patient understands the diabetes disease and treatment process. Needs Instruction    Patient understands incorporating nutritional management into lifestyle. Needs Instruction  Patient undertands incorporating physical activity into lifestyle. Needs Instruction    Patient understands using medications safely. Needs Instruction    Patient understands monitoring blood glucose, interpreting and using results Needs Instruction    Patient understands prevention, detection, and treatment of acute complications. Needs Instruction    Patient understands prevention, detection, and treatment of chronic complications. Needs Instruction    Patient understands how to develop strategies to address psychosocial  issues. Needs Instruction    Patient understands how to develop strategies to promote health/change behavior. Needs Instruction      Complications   Last HgB A1C per patient/outside source 11 %   07/16/2022 increased from 9.4% 03/04/2022   How often do you check your blood sugar? 0 times/day (not testing)    Have you had a dilated eye exam in the past 12 months? Yes    Have you had a dental exam in the past 12 months? Yes    Are you checking your feet? No      Dietary Intake   Breakfast multigrain inglish muffin with egg, coffee with milk    Lunch [place] dog on bun with honey BBQ chips, apple    Snack (afternoon) guacamole and tortilla chips    Dinner beyond burger, sauteed mushrooms and onions, brown rice    Snack (evening) 2 cups ice cream    Beverage(s) water, diet cranberry juice, club soda, coffee with oatmilk, occasional diet coke or diet gingerale      Activity / Exercise   Activity / Exercise Type ADL's      Patient Education   Previous Diabetes Education No    Disease Pathophysiology Definition of diabetes, type 1 and 2, and the diagnosis of diabetes    Healthy Eating Role of diet in the treatment of diabetes and the relationship between the three main macronutrients and blood glucose level;Plate Method;Meal options for control of blood glucose level and chronic complications.    Being Active Role of exercise on diabetes management, blood pressure control and cardiac health.;Helped patient identify appropriate exercises in relation to his/her diabetes, diabetes complications and other health issue.    Medications Reviewed patients medication for diabetes, action, purpose, timing of dose and side effects.    Monitoring Taught/evaluated SMBG meter.;Identified appropriate SMBG and/or A1C goals.;Purpose and frequency of SMBG.;Daily foot exams;Yearly dilated eye exam    Acute complications Taught prevention, symptoms, and  treatment of hypoglycemia - the 15 rule.;Discussed and  identified patients' prevention, symptoms, and treatment of hyperglycemia.    Chronic complications Relationship between chronic complications and blood glucose control    Diabetes Stress and Support Identified and addressed patients feelings and concerns about diabetes;Worked with patient to identify barriers to care and solutions;Role of stress on diabetes      Individualized Goals (developed by patient)   Nutrition General guidelines for healthy choices and portions discussed    Physical Activity Exercise 5-7 days per week;15 minutes per day    Medications take my medication as prescribed    Monitoring  Test my blood glucose as discussed    Problem Solving Eating Pattern;Addressing barriers to behavior change    Reducing Risk examine blood glucose patterns;do foot checks daily    Health Coping Ask for help with psychological, social, or emotional issues      Post-Education Assessment   Patient understands the diabetes disease and treatment process. Comprehends key points    Patient understands incorporating nutritional management into lifestyle. Comprehends key points    Patient undertands incorporating physical  activity into lifestyle. Comprehends key points    Patient understands using medications safely. Comphrehends key points    Patient understands monitoring blood glucose, interpreting and using results Comprehends key points    Patient understands prevention, detection, and treatment of acute complications. Comprehends key points    Patient understands prevention, detection, and treatment of chronic complications. Comprehends key points    Patient understands how to develop strategies to address psychosocial issues. Comprehends key points    Patient understands how to develop strategies to promote health/change behavior. Needs Review      Outcomes   Expected Outcomes Demonstrated interest in learning. Expect positive outcomes    Future DMSE 2 months    Program Status Not  Completed             Individualized Plan for Diabetes Self-Management Training:   Learning Objective:  Patient will have a greater understanding of diabetes self-management. Patient education plan is to attend individual and/or group sessions per assessed needs and concerns.   Plan:   Patient Instructions  I have sent a message to your provideer to order you lancets and strips for the Accu-Chek Guide Me meter as well as a CGM options to see how this is covered by your insurance.  CGM options include:  FreeSTyle Libre 3 or Dexcom G7. If not eligible for a CGM then a POGO blood glucose meter would be easier for you to use.  Goals:  Consider some form of exercise most days of the week.     2.  Mindfulness to eat less sugar 3.  Monitor your blood glucose to be more aware of your trends.   Mindfulness:  Consistently scheduled meal - avoid skipping  Choices  Eat slowly  Away from distraction (sitting in kitchen or dining room)  Stop eating when satisfied  Before a snack ask, "Am I hungry or eating for another reason?"   "What can I do instead if I am not hungry?"  Try to find something every day that brings you joy!   Expected Outcomes:  Demonstrated interest in learning. Expect positive outcomes  Education material provided: ADA - How to Thrive: A Guide for Your Journey with Diabetes, Meal plan card, Snack sheet, and Diabetes Resources  If problems or questions, patient to contact team via:  Phone  Future DSME appointment: 2 months

## 2022-08-30 NOTE — Patient Instructions (Addendum)
I have sent a message to your provideer to order you lancets and strips for the Accu-Chek Guide Me meter as well as a CGM options to see how this is covered by your insurance.  CGM options include:  FreeSTyle Libre 3 or Dexcom G7. If not eligible for a CGM then a POGO blood glucose meter would be easier for you to use.  Goals:  Consider some form of exercise most days of the week.     2.  Mindfulness to eat less sugar 3.  Monitor your blood glucose to be more aware of your trends.   Mindfulness:  Consistently scheduled meal - avoid skipping  Choices  Eat slowly  Away from distraction (sitting in kitchen or dining room)  Stop eating when satisfied  Before a snack ask, "Am I hungry or eating for another reason?"   "What can I do instead if I am not hungry?"  Try to find something every day that brings you joy!

## 2022-09-02 DIAGNOSIS — K080 Exfoliation of teeth due to systemic causes: Secondary | ICD-10-CM | POA: Diagnosis not present

## 2022-09-05 ENCOUNTER — Other Ambulatory Visit: Payer: Self-pay | Admitting: Physician Assistant

## 2022-09-23 ENCOUNTER — Encounter: Payer: Self-pay | Admitting: Physician Assistant

## 2022-09-23 DIAGNOSIS — L03113 Cellulitis of right upper limb: Secondary | ICD-10-CM | POA: Diagnosis not present

## 2022-09-25 NOTE — Progress Notes (Signed)
Amanda Rollins is a 72 y.o. female here for a follow up of a pre-existing problem.  History of Present Illness:   Chief Complaint  Patient presents with   Diabetes    HPI  Type 2 Diabetes Mellitus Treated with Jardiance 10 mg daily before breakfast. HGBA1c at 11% on 07/16/22. She is working on [employer].  Persistent Sore Throat Has had sore throat, cough since 06/2022. Pain is mild. Taking OTC allergy medication, Flonase as needed. Water intake adequate. Has some heart burn.  Taking omeprazole 10 mg daily. Denies ear pain.  Cellulitis Right Arm Seen at urgent care for this 09/23/22. Received rocephin injection and oral keflex antibiotic(s).  This has completely resolved. Seems to have flare-ups once a year.  Hypertension Treated with amlodipine 5 mg daily, losartan-hydrochlorothiazide 100-12.5 mg daily. Blood pressure normal today at 120/80.  Hypothyroidism Currently taking levothyroxine 50 mcg daily Last TSH normal in Nov 2023  Past Medical History:  Diagnosis Date   Breast cancer (HCC) 2011   Diabetes mellitus type 2, controlled (HCC)    Heart murmur    Hyperlipidemia    Hypertension    Hypothyroidism      Social History   Tobacco Use   Smoking status: Former    Types: Cigarettes    Start date: 02/07/1970   Smokeless tobacco: Never  Substance Use Topics   Alcohol use: Not Currently    Alcohol/week: 3.0 standard drinks of alcohol    Types: 3 Glasses of wine per week   Drug use: Never    Past Surgical History:  Procedure Laterality Date   ABDOMINAL HYSTERECTOMY  2000   Still has ovaries   BELPHAROPTOSIS REPAIR  07/2019   BREAST SURGERY     CATARACT EXTRACTION  2019   Left eye 2019, Right eye 2020   CESAREAN SECTION  12/08/1990   MASTECTOMY  2011   Bilateral   ROBOTIC ASSISTED LAPAROSCOPIC VAGINAL HYSTERECTOMY WITH FIBROID REMOVAL  1991    Family History  Adopted: Yes  Problem Relation Age of Onset   Cancer Mother        small cell  cancer of liver and lungs    Allergies  Allergen Reactions   Sulfa Antibiotics Rash   Metformin Diarrhea    Current Medications:   Current Outpatient Medications:    alendronate (FOSAMAX) 70 MG tablet, Take 1 tablet (70 mg total) by mouth once a week. Take with a full glass of water on an empty stomach., Disp: 12 tablet, Rfl: 3   amLODipine (NORVASC) 5 MG tablet, TAKE 1 TABLET(5 MG) BY MOUTH DAILY, Disp: 90 tablet, Rfl: 1   baclofen (LIORESAL) 10 MG tablet, Take 1 tablet (10 mg total) by mouth at bedtime as needed for muscle spasms., Disp: 30 each, Rfl: 0   calcium-vitamin D (OSCAL WITH D) 250-125 MG-UNIT tablet, Take 1 tablet by mouth daily., Disp: , Rfl:    fluticasone (FLONASE) 50 MCG/ACT nasal spray, SHAKE LIQUID AND USE 2 SPRAYS IN EACH NOSTRIL DAILY, Disp: 16 g, Rfl: 2   gabapentin (NEURONTIN) 300 MG capsule, Take 1 capsule (300 mg total) by mouth at bedtime., Disp: 90 capsule, Rfl: 1   JARDIANCE 10 MG TABS tablet, TAKE 1 TABLET(10 MG) BY MOUTH DAILY BEFORE BREAKFAST, Disp: 30 tablet, Rfl: 5   levothyroxine (SYNTHROID) 50 MCG tablet, TAKE 1 TABLET(50 MCG) BY MOUTH DAILY, Disp: 90 tablet, Rfl: 1   losartan-hydrochlorothiazide (HYZAAR) 100-12.5 MG tablet, TAKE 1 TABLET BY MOUTH DAILY, Disp: 90 tablet, Rfl: 1  magnesium gluconate (MAGONATE) 500 MG tablet, Take 500 mg by mouth 2 (two) times daily., Disp: , Rfl:    omeprazole (PRILOSEC) 10 MG capsule, Take 10 mg by mouth daily., Disp: , Rfl:    rosuvastatin (CRESTOR) 5 MG tablet, TAKE 1 TABLET(5 MG) BY MOUTH DAILY, Disp: 90 tablet, Rfl: 1   triamcinolone ointment (KENALOG) 0.5 %, Use twice daily, Disp: 15 g, Rfl: 0   Review of Systems:   Review of Systems  Constitutional:  Negative for fever and malaise/fatigue.  HENT:  Positive for sore throat. Negative for congestion.   Eyes:  Negative for blurred vision.  Respiratory:  Positive for cough. Negative for shortness of breath.   Cardiovascular:  Negative for chest pain, palpitations  and leg swelling.  Gastrointestinal:  Negative for vomiting.  Musculoskeletal:  Negative for back pain.  Skin:  Negative for rash.  Neurological:  Negative for loss of consciousness and headaches.    Vitals:   Vitals:   10/02/22 0900  BP: 120/80  Pulse: 78  Temp: (!) 97.3 F (36.3 C)  TempSrc: Temporal  SpO2: 97%  Weight: 192 lb 4 oz (87.2 kg)  Height: 5\' 4"  (1.626 m)     Body mass index is 33 kg/m.  Physical Exam:   Physical Exam Vitals and nursing note reviewed.  Constitutional:      General: She is not in acute distress.    Appearance: She is well-developed. She is not ill-appearing or toxic-appearing.  HENT:     Mouth/Throat:     Pharynx: Posterior oropharyngeal erythema present.  Cardiovascular:     Rate and Rhythm: Normal rate and regular rhythm.     Pulses: Normal pulses.     Heart sounds: Normal heart sounds, S1 normal and S2 normal.  Pulmonary:     Effort: Pulmonary effort is normal.     Breath sounds: Normal breath sounds.  Skin:    General: Skin is warm and dry.  Neurological:     Mental Status: She is alert.     GCS: GCS eye subscore is 4. GCS verbal subscore is 5. GCS motor subscore is 6.  Psychiatric:        Speech: Speech normal.        Behavior: Behavior normal. Behavior is cooperative.     Assessment and Plan:   Cellulitis of right upper extremity Resolved Recommend continued adequate care and hygiene, low threshold to follow-up if recurrent sx  Primary hypertension Normotensive Continue amlodipine 5 mg daily, losartan-hydrochlorothiazide 100-12.5 mg daily Follow-up in 3-6 month(s), sooner if concerns  Diabetes mellitus treated with oral medication (HCC) Uncontrolled She is reluctant to start new medication Will recheck A1c in 2-4 weeks when due Discussed that if this remains elevated, she will need to start another medication Continue jardiance 10 mg daily Consider endo  Acquired hypothyroidism Update TSH and adjust  levothyroxine 50 mcg daily as indicated  Sore throat No red flags To treat POST NASAL DRIP, recommend starting atrovent nasal spray Follow-up if lack of improvement or any worsening   I,Alexander Ruley,acting as a scribe for [employer], PA.,have documented all relevant documentation on the behalf of Jarold Motto, PA,as directed by  Jarold Motto, PA while in the presence of Jarold Motto, [place].   I, Jarold Motto, [place], have reviewed all documentation for this visit. The documentation on 10/02/22 for the exam, diagnosis, procedures, and orders are all accurate and complete.    Jarold Motto, PA-C

## 2022-10-02 ENCOUNTER — Ambulatory Visit (INDEPENDENT_AMBULATORY_CARE_PROVIDER_SITE_OTHER): Payer: Medicare Other | Admitting: Physician Assistant

## 2022-10-02 ENCOUNTER — Encounter: Payer: Self-pay | Admitting: Physician Assistant

## 2022-10-02 VITALS — BP 120/80 | HR 78 | Temp 97.3°F | Ht 64.0 in | Wt 192.2 lb

## 2022-10-02 DIAGNOSIS — E119 Type 2 diabetes mellitus without complications: Secondary | ICD-10-CM | POA: Diagnosis not present

## 2022-10-02 DIAGNOSIS — J029 Acute pharyngitis, unspecified: Secondary | ICD-10-CM

## 2022-10-02 DIAGNOSIS — L03113 Cellulitis of right upper limb: Secondary | ICD-10-CM

## 2022-10-02 DIAGNOSIS — Z7984 Long term (current) use of oral hypoglycemic drugs: Secondary | ICD-10-CM | POA: Diagnosis not present

## 2022-10-02 DIAGNOSIS — I10 Essential (primary) hypertension: Secondary | ICD-10-CM | POA: Diagnosis not present

## 2022-10-02 DIAGNOSIS — E039 Hypothyroidism, unspecified: Secondary | ICD-10-CM

## 2022-10-02 MED ORDER — IPRATROPIUM BROMIDE 0.06 % NA SOLN: 2.0000 | Freq: Three times a day (TID) | NASAL | 12 refills | Status: DC

## 2022-10-02 NOTE — Patient Instructions (Signed)
It was great to see you!  Please schedule labs for July You will be asked to provide a urine sample as well  If your blood sugars remain elevated we will need to discuss starting new prescription  Trial new nasal spray to see if this helps dry up your drip  Take care,  Jarold Motto PA-C

## 2022-10-08 ENCOUNTER — Ambulatory Visit: Payer: Medicare Other | Admitting: Physician Assistant

## 2022-10-17 ENCOUNTER — Other Ambulatory Visit: Payer: Self-pay | Admitting: Physician Assistant

## 2022-11-01 ENCOUNTER — Other Ambulatory Visit (INDEPENDENT_AMBULATORY_CARE_PROVIDER_SITE_OTHER): Payer: Medicare Other

## 2022-11-01 DIAGNOSIS — Z7984 Long term (current) use of oral hypoglycemic drugs: Secondary | ICD-10-CM

## 2022-11-01 DIAGNOSIS — E119 Type 2 diabetes mellitus without complications: Secondary | ICD-10-CM

## 2022-11-01 DIAGNOSIS — E039 Hypothyroidism, unspecified: Secondary | ICD-10-CM

## 2022-11-01 LAB — MICROALBUMIN / CREATININE URINE RATIO
Creatinine,U: 47.6 mg/dL
Microalb Creat Ratio: 1.5 mg/g (ref 0.0–30.0)
Microalb, Ur: <0.7 mg/dL (ref 0.0–1.9)

## 2022-11-01 LAB — TSH: TSH: 4.03 u[IU]/mL (ref 0.35–5.50)

## 2022-11-01 LAB — HEMOGLOBIN A1C: Hgb A1c MFr Bld: 10.4 % — ABNORMAL HIGH (ref 4.6–6.5)

## 2022-11-18 ENCOUNTER — Ambulatory Visit: Payer: Medicare Other | Admitting: Dietician

## 2022-12-24 ENCOUNTER — Ambulatory Visit (INDEPENDENT_AMBULATORY_CARE_PROVIDER_SITE_OTHER): Payer: Medicare Other | Admitting: Physician Assistant

## 2022-12-24 ENCOUNTER — Encounter: Payer: Self-pay | Admitting: Physician Assistant

## 2022-12-24 ENCOUNTER — Ambulatory Visit: Payer: Medicare Other | Attending: Physician Assistant

## 2022-12-24 VITALS — BP 118/80 | HR 97 | Temp 97.1°F | Ht 64.0 in | Wt 187.2 lb

## 2022-12-24 DIAGNOSIS — E039 Hypothyroidism, unspecified: Secondary | ICD-10-CM | POA: Diagnosis not present

## 2022-12-24 DIAGNOSIS — R002 Palpitations: Secondary | ICD-10-CM

## 2022-12-24 DIAGNOSIS — I10 Essential (primary) hypertension: Secondary | ICD-10-CM | POA: Diagnosis not present

## 2022-12-24 DIAGNOSIS — Z7984 Long term (current) use of oral hypoglycemic drugs: Secondary | ICD-10-CM

## 2022-12-24 DIAGNOSIS — E119 Type 2 diabetes mellitus without complications: Secondary | ICD-10-CM

## 2022-12-24 MED ORDER — EMPAGLIFLOZIN 10 MG PO TABS: 10.0000 mg | ORAL_TABLET | Freq: Every day | ORAL | 3 refills | Status: DC

## 2022-12-24 MED ORDER — AMLODIPINE BESYLATE 5 MG PO TABS: 5.0000 mg | ORAL_TABLET | Freq: Every day | ORAL | 3 refills | Status: DC

## 2022-12-24 MED ORDER — LEVOTHYROXINE SODIUM 50 MCG PO TABS: ORAL_TABLET | ORAL | 1 refills | Status: DC

## 2022-12-24 NOTE — Progress Notes (Signed)
Amanda Rollins is a 72 y.o. female here for a follow up of a pre-existing problem.  History of Present Illness:   Chief Complaint  Patient presents with   Tachycardia    Pt c/o episode of heart racing when watching TV, heart rate was 127. Last episode a week ago heart rate was 118, was in bed. Denies SOB, no chest pain.    HPI  Tachycardia: Complains of acute tachycardic episodes that first occurred a month ago.   Notes that she has been out of her 5 mg Amlodipine and 50 mcg Levothyroxine for a couple of weeks.  Reports that the first episode occurred while she was watching TV. She felt that something was about to happen and then her heart rate increased. Fit-bit reading of heart rate was 127. Resolved within 5-7 minutes.  Latest episode occurred last week while she was trying to fall asleep, but heart rate only increased to 118.  States that she last saw cardiology in 2015 because she was experiencing moments of lightheadedness.  Endorses occasional burning pain in area where port was located.   Denies shortness of breath, chest pain, consumption of inflammatory foods prior to episodes, or phx of Afib.   HTN Currently taking amlodipine 5 mg, losartan-hydrochloroTHIAZIDE 100-12.5 mg. At home blood pressure readings are: not checked. Patient denies chest pain, SOB, blurred vision, dizziness, unusual headaches, lower leg swelling. Patient is not compliant with medication. Denies excessive caffeine intake, stimulant usage, excessive alcohol intake, or increase in salt consumption.  BP Readings from Last 3 Encounters:  12/24/22 118/80  10/02/22 120/80  07/22/22 136/78      Diabetes 3 month follow-up. Current DM meds: jardiance 10 mg daily. Blood sugars at home are: not checked.  Patient is not compliant with medications. Denies: hypoglycemic or hyperglycemic episodes or symptoms.   Lab Results  Component Value Date   HGBA1C 10.4 (H) 11/01/2022    Hypothyroidism Currently  prescribed levothyroxine 50 mcg daily Has been without medication for 2-3 weeks  Lab Results  Component Value Date   TSH 4.03 11/01/2022     Past Medical History:  Diagnosis Date   Breast cancer (HCC) 2011   Diabetes mellitus type 2, controlled (HCC)    Heart murmur    Hyperlipidemia    Hypertension    Hypothyroidism      Social History   Tobacco Use   Smoking status: Former    Types: Cigarettes    Start date: 02/07/1970   Smokeless tobacco: Never  Substance Use Topics   Alcohol use: Not Currently    Alcohol/week: 3.0 standard drinks of alcohol    Types: 3 Glasses of wine per week   Drug use: Never    Past Surgical History:  Procedure Laterality Date   ABDOMINAL HYSTERECTOMY  2000   Still has ovaries   BELPHAROPTOSIS REPAIR  07/2019   BREAST SURGERY     CATARACT EXTRACTION  2019   Left eye 2019, Right eye 2020   CESAREAN SECTION  12/08/1990   MASTECTOMY  2011   Bilateral   ROBOTIC ASSISTED LAPAROSCOPIC VAGINAL HYSTERECTOMY WITH FIBROID REMOVAL  1991    Family History  Adopted: Yes  Problem Relation Age of Onset   Cancer Mother        small cell cancer of liver and lungs    Allergies  Allergen Reactions   Sulfa Antibiotics Rash   Metformin Diarrhea    Current Medications:   Current Outpatient Medications:    alendronate (FOSAMAX)  70 MG tablet, Take 1 tablet (70 mg total) by mouth once a week. Take with a full glass of water on an empty stomach., Disp: 12 tablet, Rfl: 3   calcium-vitamin D (OSCAL WITH D) 250-125 MG-UNIT tablet, Take 1 tablet by mouth daily., Disp: , Rfl:    gabapentin (NEURONTIN) 300 MG capsule, Take 1 capsule (300 mg total) by mouth at bedtime., Disp: 90 capsule, Rfl: 1   losartan-hydrochlorothiazide (HYZAAR) 100-12.5 MG tablet, TAKE 1 TABLET BY MOUTH DAILY, Disp: 90 tablet, Rfl: 1   magnesium gluconate (MAGONATE) 500 MG tablet, Take 500 mg by mouth 2 (two) times daily., Disp: , Rfl:    omeprazole (PRILOSEC) 10 MG capsule, Take 10 mg  by mouth daily., Disp: , Rfl:    rosuvastatin (CRESTOR) 5 MG tablet, TAKE 1 TABLET(5 MG) BY MOUTH DAILY, Disp: 90 tablet, Rfl: 1   triamcinolone ointment (KENALOG) 0.5 %, Use twice daily, Disp: 15 g, Rfl: 0   amLODipine (NORVASC) 5 MG tablet, Take 1 tablet (5 mg total) by mouth daily., Disp: 90 tablet, Rfl: 3   empagliflozin (JARDIANCE) 10 MG TABS tablet, Take 1 tablet (10 mg total) by mouth daily., Disp: 90 tablet, Rfl: 3   levothyroxine (SYNTHROID) 50 MCG tablet, TAKE 1 TABLET(50 MCG) BY MOUTH DAILY, Disp: 90 tablet, Rfl: 1   Review of Systems:   Review of Systems  Cardiovascular:        (+) Tachycardic episodes (acute onset, several minute duration)    Vitals:   Vitals:   12/24/22 1128  BP: 118/80  Pulse: 97  Temp: (!) 97.1 F (36.2 C)  TempSrc: Temporal  SpO2: 98%  Weight: 187 lb 4 oz (84.9 kg)  Height: 5\' 4"  (1.626 m)     Body mass index is 32.14 kg/m.  Physical Exam:   Physical Exam Vitals and nursing note reviewed.  Constitutional:      General: She is not in acute distress.    Appearance: She is well-developed. She is not ill-appearing or toxic-appearing.  Cardiovascular:     Rate and Rhythm: Normal rate and regular rhythm.     Pulses: Normal pulses.     Heart sounds: S1 normal and S2 normal. Murmur heard.  Pulmonary:     Effort: Pulmonary effort is normal.     Breath sounds: Normal breath sounds.  Skin:    General: Skin is warm and dry.  Neurological:     Mental Status: She is alert.     GCS: GCS eye subscore is 4. GCS verbal subscore is 5. GCS motor subscore is 6.  Psychiatric:        Speech: Speech normal.        Behavior: Behavior normal. Behavior is cooperative.     Assessment and Plan:   Primary hypertension Normotensive Overall well controlled despite not having medication Refill amlodipine Continue amlodipine 5 mg, losartan-hydrochloroTHIAZIDE 100-12.5 mg Follow-up in 6 months, sooner if concerns  Acquired hypothyroidism Resume  levothyroxine 50 mcg daily Follow-up in 3-6 months to recheck TSH as well  Palpitations EKG tracing is personally reviewed.  EKG notes NSR.  No acute changes.  Discussed importance of not missing thyroid and blood pressure medications Does not have good control of diabetes mellitus and needs to work better on this - encouraged compliance Discussed option of referral to cardiology vs Zio patch -- she would like to start Zio patch and if symptom(s) worsen or results of Zio patch are concerning, she would like referral at that time  Controlled  type 2 diabetes mellitus without complication, without long-term current use of insulin (HCC) Remains non-compliant with regimen Recommend close monitoring of blood sugars and to get back on track of overall health Resume jardiance 10 mg daily   I,Emily Lagle,acting as a Neurosurgeon for [employer], PA.,have documented all relevant documentation on the behalf of Jarold Motto, PA,as directed by  Jarold Motto, PA while in the presence of Jarold Motto, [place].  I, Jarold Motto, [place], have reviewed all documentation for this visit. The documentation on 12/24/22 for the exam, diagnosis, procedures, and orders are all accurate and complete.   Jarold Motto, PA-C

## 2022-12-24 NOTE — Progress Notes (Unsigned)
Enrolled for [employer] to mail a ZIO XT long term holter monitor to the patients address on file.   DOD to read. 

## 2022-12-24 NOTE — Patient Instructions (Signed)
It was great to see you!  We will get EKG today We will order Zio patch heart monitor to see if we can get some information on these episodes  If any NEW/WORSENING symptom(s), I need to know!  Please do not go without your medications! This could be contributing to your symptoms.  Take care,  Jarold Motto PA-C

## 2022-12-30 DIAGNOSIS — R002 Palpitations: Secondary | ICD-10-CM

## 2023-01-17 ENCOUNTER — Other Ambulatory Visit: Payer: Self-pay | Admitting: Physician Assistant

## 2023-01-17 DIAGNOSIS — R002 Palpitations: Secondary | ICD-10-CM | POA: Diagnosis not present

## 2023-01-20 DIAGNOSIS — H524 Presbyopia: Secondary | ICD-10-CM | POA: Diagnosis not present

## 2023-01-20 LAB — HM DIABETES EYE EXAM

## 2023-01-20 NOTE — Telephone Encounter (Signed)
Pt requesting refill for Gabapentin 300 mg. Last OV 12/24/2022

## 2023-01-27 ENCOUNTER — Encounter: Payer: Self-pay | Admitting: Physician Assistant

## 2023-01-27 DIAGNOSIS — R002 Palpitations: Secondary | ICD-10-CM

## 2023-01-27 DIAGNOSIS — I472 Ventricular tachycardia, unspecified: Secondary | ICD-10-CM

## 2023-03-03 DIAGNOSIS — K080 Exfoliation of teeth due to systemic causes: Secondary | ICD-10-CM | POA: Diagnosis not present

## 2023-03-12 ENCOUNTER — Ambulatory Visit: Payer: Medicare Other | Attending: Cardiology | Admitting: Cardiology

## 2023-03-12 ENCOUNTER — Encounter: Payer: Self-pay | Admitting: Cardiology

## 2023-03-12 VITALS — BP 138/78 | HR 92 | Resp 16 | Ht 64.0 in | Wt 183.8 lb

## 2023-03-12 DIAGNOSIS — I2584 Coronary atherosclerosis due to calcified coronary lesion: Secondary | ICD-10-CM

## 2023-03-12 DIAGNOSIS — E782 Mixed hyperlipidemia: Secondary | ICD-10-CM

## 2023-03-12 DIAGNOSIS — R002 Palpitations: Secondary | ICD-10-CM

## 2023-03-12 DIAGNOSIS — I4729 Other ventricular tachycardia: Secondary | ICD-10-CM | POA: Diagnosis not present

## 2023-03-12 DIAGNOSIS — I2510 Atherosclerotic heart disease of native coronary artery without angina pectoris: Secondary | ICD-10-CM

## 2023-03-12 DIAGNOSIS — I10 Essential (primary) hypertension: Secondary | ICD-10-CM

## 2023-03-12 DIAGNOSIS — R011 Cardiac murmur, unspecified: Secondary | ICD-10-CM

## 2023-03-12 MED ORDER — METOPROLOL SUCCINATE ER 25 MG PO TB24
25.0000 mg | ORAL_TABLET | Freq: Every day | ORAL | 1 refills | Status: DC
Start: 2023-03-12 — End: 2023-06-12

## 2023-03-12 NOTE — Progress Notes (Signed)
Cardiology Office Note:    Date:  03/12/2023  NAME:  Amanda Rollins    MRN: [id number] DOB:  [birth year]   PCP:  Jarold Motto, PA  Former Cardiology Providers: None Primary Cardiologist:  Tessa Lerner, DO, [facility] (established care 03/12/2023) Electrophysiologist:  None   Referring MD: Jarold Motto, PA  Reason of Consult: Ventricular tachycardia  Chief Complaint  Patient presents with   Palpitations   New Patient (Initial Visit)    History of Present Illness:    Amanda Rollins is a 72 y.o. Caucasian female whose past medical history and cardiovascular risk factors includes: Breast cancer (dx 2011-chemo/radiation/ Bilateral mastectomies), Hypertension, hypothyroidism, hyperlipidemia, non-insulin-dependent diabetes mellitus type 2, former smoker. She is being seen today for the evaluation of ventricular tachycardia at the request of Jarold Motto, [place].  Patient is accompanied by her daughter and granddaughter at today's office visit.  Patient states that she has had a long history of palpitations dating back to 2015 she was seen by her cardiologist while she was in [place].  Nothing was done at that time with the exception of changing her from atenolol to amlodipine for blood pressure management.  In the recent past she has been noticing increased episodes of palpitations which occur sporadically.  Patient states that she has had 3 episodes over the last 6 months where she is resting and noticed sudden onset of palpitations and a ventricular rate of 125 bpm.  She voiced her concerns to her PCP ordered a outpatient Zio patch.  Results reviewed independently and noted below for further reference.  In summary underlying rhythm is sinus, rare episodes of PSVT and 1 asymptomatic auto triggered event of NSVT.  She is referred to cardiology for further evaluation and management.  Clinically denies anginal chest pain or heart failure symptoms.  Current Medications: Current  Meds  Medication Sig   alendronate (FOSAMAX) 70 MG tablet Take 1 tablet (70 mg total) by mouth once a week. Take with a full glass of water on an empty stomach.   amLODipine (NORVASC) 5 MG tablet Take 1 tablet (5 mg total) by mouth daily.   calcium-vitamin D (OSCAL WITH D) 250-125 MG-UNIT tablet Take 1 tablet by mouth daily.   empagliflozin (JARDIANCE) 10 MG TABS tablet Take 1 tablet (10 mg total) by mouth daily.   gabapentin (NEURONTIN) 300 MG capsule TAKE 1 CAPSULE(300 MG) BY MOUTH AT BEDTIME   levothyroxine (SYNTHROID) 50 MCG tablet TAKE 1 TABLET(50 MCG) BY MOUTH DAILY   losartan-hydrochlorothiazide (HYZAAR) 100-12.5 MG tablet TAKE 1 TABLET BY MOUTH DAILY   magnesium gluconate (MAGONATE) 500 MG tablet Take 500 mg by mouth 2 (two) times daily.   metoprolol succinate (TOPROL XL) 25 MG 24 hr tablet Take 1 tablet (25 mg total) by mouth daily.   omeprazole (PRILOSEC) 10 MG capsule Take 10 mg by mouth daily.   rosuvastatin (CRESTOR) 5 MG tablet TAKE 1 TABLET(5 MG) BY MOUTH DAILY   triamcinolone ointment (KENALOG) 0.5 % Use twice daily     Allergies:    Sulfa antibiotics and Metformin   Past Medical History: Past Medical History:  Diagnosis Date   Breast cancer (HCC) 2011   Diabetes mellitus type 2, controlled (HCC)    Heart murmur    Hyperlipidemia    Hypertension    Hypothyroidism     Past Surgical History: Past Surgical History:  Procedure Laterality Date   ABDOMINAL HYSTERECTOMY  2000   Still has ovaries   BELPHAROPTOSIS REPAIR  07/2019  BREAST SURGERY     CATARACT EXTRACTION  2019   Left eye 2019, Right eye 2020   CESAREAN SECTION  12/08/1990   MASTECTOMY  2011   Bilateral   ROBOTIC ASSISTED LAPAROSCOPIC VAGINAL HYSTERECTOMY WITH FIBROID REMOVAL  1991    Social History: Social History   Tobacco Use   Smoking status: Former    Types: Cigarettes    Start date: 02/07/1970   Smokeless tobacco: Never  Substance Use Topics   Alcohol use: Not Currently     Alcohol/week: 3.0 standard drinks of alcohol    Types: 3 Glasses of wine per week   Drug use: Never    Family History: Family History  Adopted: Yes  Problem Relation Age of Onset   Cancer Mother        small cell cancer of liver and lungs    ROS:   Review of Systems  Cardiovascular:  Positive for palpitations. Negative for chest pain, claudication, irregular heartbeat, leg swelling, near-syncope, orthopnea, paroxysmal nocturnal dyspnea and syncope.  Respiratory:  Negative for shortness of breath.   Hematologic/Lymphatic: Negative for bleeding problem.    EKGs/Labs/Other Studies Reviewed:   EKG: EKG Interpretation Date/Time:  Wednesday March 12 2023 15:12:21 EST Text Interpretation: Normal sinus rhythm Possible Left atrial enlargement Left anterior fascicular block Left ventricular hypertrophy with repolarization abnormality ( R in aVL , Cornell product ) No previous ECGs available Confirmed by Tessa Lerner [phone]) on 03/12/2023 3:15:50 PM  Echocardiogram: None  Zio patch: September 2024   Predominant rhythm is sinus   Rare episodes of nonsustained SVT.   the fastest episode lasted 8 beats with a max HR of 197.  the longest episode lasted 7 beats with avg HR of 109   Rare PACs, rare PVCs   No serious arrhythmias observed. 1 asymptomatic episode of NSVT on 01/07/2023 at 6:09 PM, max heart rate 162 bpm, average heart rate 148 bpm, 3.6-second duration.   Labs:    Latest Ref Rng & Units 07/16/2022   10:24 AM 03/04/2022    4:03 PM 08/10/2021    3:15 PM  CBC  WBC 4.0 - 10.5 K/uL 8.0  11.9  7.8   Hemoglobin 12.0 - 15.0 g/dL 91.4  78.2  95.6   Hematocrit 36.0 - 46.0 % 43.0  38.1  41.5   Platelets 150.0 - 400.0 K/uL 220.0  198.0  252        Latest Ref Rng & Units 07/16/2022   10:24 AM 03/04/2022    4:03 PM 08/10/2021    3:15 PM  BMP  Glucose 70 - 99 mg/dL 213  086  578   BUN 6 - 23 mg/dL 21  19  16    Creatinine 0.40 - 1.20 mg/dL 4.69  6.29  5.28   BUN/Creat Ratio 6 -  22 (calc)   NOT APPLICABLE   Sodium 135 - 145 mEq/L 136  134  133   Potassium 3.5 - 5.1 mEq/L 3.8  3.3  4.1   Chloride 96 - 112 mEq/L 100  98  97   CO2 19 - 32 mEq/L 25  27  27    Calcium 8.6 - 10.4 mg/dL 8.4 - 41.3 mg/dL 9.9    9.7  9.5  9.7       Latest Ref Rng & Units 07/16/2022   10:24 AM 03/04/2022    4:03 PM 08/10/2021    3:15 PM  CMP  Glucose 70 - 99 mg/dL 244  010  272   BUN 6 -  23 mg/dL 21  19  16    Creatinine 0.40 - 1.20 mg/dL 8.11  9.14  7.82   Sodium 135 - 145 mEq/L 136  134  133   Potassium 3.5 - 5.1 mEq/L 3.8  3.3  4.1   Chloride 96 - 112 mEq/L 100  98  97   CO2 19 - 32 mEq/L 25  27  27    Calcium 8.6 - 10.4 mg/dL 8.4 - 95.6 mg/dL 9.9    9.7  9.5  9.7   Total Protein 6.0 - 8.3 g/dL 7.1  7.3  7.2   Total Bilirubin 0.2 - 1.2 mg/dL 0.5  0.5  0.4   Alkaline Phos 39 - 117 U/L 45  54    AST 0 - 37 U/L 14  13  14    ALT 0 - 35 U/L 15  13  12      Lab Results  Component Value Date   CHOL 182 07/16/2022   HDL 55.60 07/16/2022   LDLCALC 87 07/16/2022   TRIG 198.0 (H) 07/16/2022   CHOLHDL 3 07/16/2022   No results for input(s): "LIPOA" in the last 8760 hours. No components found for: "NTPROBNP" No results for input(s): "PROBNP" in the last 8760 hours. Recent Labs    11/01/22 0809  TSH 4.03    Physical Exam:    Today's Vitals   03/12/23 1507  BP: 138/78  Pulse: 92  Resp: 16  SpO2: 93%  Weight: 183 lb 12.8 oz (83.4 kg)  Height: 5\' 4"  (1.626 m)   Body mass index is 31.55 kg/m. Wt Readings from Last 3 Encounters:  03/12/23 183 lb 12.8 oz (83.4 kg)  12/24/22 187 lb 4 oz (84.9 kg)  10/02/22 192 lb 4 oz (87.2 kg)    Physical Exam  Constitutional: No distress.  hemodynamically stable  Neck: No JVD present.  Cardiovascular: Normal rate, regular rhythm, S1 normal and S2 normal. Exam reveals no gallop, no S3 and no S4.  Murmur heard. Harsh systolic murmur is present with a grade of 3/6 at the upper right sternal border. Pulses:      Dorsalis pedis pulses  are 2+ on the right side and 2+ on the left side.       Posterior tibial pulses are 2+ on the right side and 2+ on the left side.  Pulmonary/Chest: Effort normal and breath sounds normal. No stridor. She has no wheezes. She has no rales.  Bilateral mastectomies  Abdominal: Soft. Bowel sounds are normal. She exhibits no distension. There is no abdominal tenderness.  Musculoskeletal:        General: No edema.     Cervical back: Neck supple.  Neurological: She is alert and oriented to person, place, and time. She has intact cranial nerves (2-12).  Skin: Skin is warm.     Impression & Recommendation(s):  Impression:   ICD-10-CM   1. Palpitations  R00.2 EKG 12-Lead    metoprolol succinate (TOPROL XL) 25 MG 24 hr tablet    2. NSVT (nonsustained ventricular tachycardia) (HCC)  I47.29 MYOCARDIAL PERFUSION IMAGING    metoprolol succinate (TOPROL XL) 25 MG 24 hr tablet    3. Benign hypertension  I10     4. Mixed hyperlipidemia  E78.2     5. Calcification of native coronary artery  I25.10 MYOCARDIAL PERFUSION IMAGING   I25.84     6. Cardiac murmur  R01.1 ECHOCARDIOGRAM COMPLETE    7. Abnormal electrocardiogram  R94.31        Recommendation(s):  Palpitations Index event 2015. Recently more noticeable. No identifiable reversible cause Was on atenolol in the past was discontinued as she was using it for blood pressure management. Zio patch independently reviewed the results noted above.  NSVT (nonsustained ventricular tachycardia) (HCC) Had 1 asymptomatic auto triggered episode of NSVT. She continues to have episodes of palpitations/tachycardia randomly but these episodes are short-lived. Her symptoms are well-controlled in the past she was on atenolol. Will start Toprol-XL 25 mg p.o. daily for symptom management Her 1 episode of NSVT is likely an incidental finding; however, given aortic and abdominal atherosclerosis, poorly controlled diabetic, former smoker we discussed the role  of ischemic workup.   Shared decision was to proceed with exercise nuclear stress test to evaluate for functional capacity and reversible ischemia  Benign hypertension Office blood pressures are acceptable. Continue amlodipine 5 mg p.o. daily. Continue Jardiance 10 mg p.o. daily. Continue Hyzaar 100/12.5 mg p.o. daily. Reemphasized importance of low-salt diet.  Mixed hyperlipidemia Calcification of native coronary artery Given her diabetes would recommend a goal LDL <70 mg/dL. Most recent lipid profile notes a LDL level of 87 mg/dL. Given her CAC on nongated CT study, aortic atherosclerosis, diabetes recommend a goal LDL if possible <55 mg/dL, ideal situation, otherwise definitely <70 mg/dL  Cardiac murmur Physical examination findings consistent with aortic stenosis murmur. Patient has not had any recent echocardiogram to evaluate for disease progression Further recommendations to follow  Orders Placed:  Orders Placed This Encounter  Procedures   MYOCARDIAL PERFUSION IMAGING    Standing Status:   Future    Standing Expiration Date:   03/11/2024    Order Specific Question:   Patient weight in lbs    Answer:   183    Order Specific Question:   Where should this be performed?    Answer:   [facility]    Order Specific Question:   Type of stress    Answer:   Exercise   EKG 12-Lead   ECHOCARDIOGRAM COMPLETE    Standing Status:   Future    Standing Expiration Date:   03/11/2024    Order Specific Question:   Where should this test be performed    Answer:   [facility]    Order Specific Question:   Does the patient weigh less than or greater than 250 lbs?    Answer:   Patient weighs less than 250 lbs    Order Specific Question:   Perflutren DEFINITY (image enhancing agent) should be administered unless hypersensitivity or allergy exist    Answer:   Administer Perflutren    Order Specific Question:   Reason for exam-Echo    Answer:    Other-Full Diagnosis List    Order Specific Question:   Full ICD-10/Reason for Exam    Answer:   Cardiac murmur [197301]    As part of medical decision making results of the office notes as per referring physician, Zio patch results independently reviewed, labs from March 2024, EKG were reviewed independently at today's visit.   Final Medication List:    Meds ordered this encounter  Medications   metoprolol succinate (TOPROL XL) 25 MG 24 hr tablet    Sig: Take 1 tablet (25 mg total) by mouth daily.    Dispense:  90 tablet    Refill:  1    There are no discontinued medications.   Current Outpatient Medications:    alendronate (FOSAMAX) 70 MG tablet, Take 1 tablet (70  mg total) by mouth once a week. Take with a full glass of water on an empty stomach., Disp: 12 tablet, Rfl: 3   amLODipine (NORVASC) 5 MG tablet, Take 1 tablet (5 mg total) by mouth daily., Disp: 90 tablet, Rfl: 3   calcium-vitamin D (OSCAL WITH D) 250-125 MG-UNIT tablet, Take 1 tablet by mouth daily., Disp: , Rfl:    empagliflozin (JARDIANCE) 10 MG TABS tablet, Take 1 tablet (10 mg total) by mouth daily., Disp: 90 tablet, Rfl: 3   gabapentin (NEURONTIN) 300 MG capsule, TAKE 1 CAPSULE(300 MG) BY MOUTH AT BEDTIME, Disp: 90 capsule, Rfl: 1   levothyroxine (SYNTHROID) 50 MCG tablet, TAKE 1 TABLET(50 MCG) BY MOUTH DAILY, Disp: 90 tablet, Rfl: 1   losartan-hydrochlorothiazide (HYZAAR) 100-12.5 MG tablet, TAKE 1 TABLET BY MOUTH DAILY, Disp: 90 tablet, Rfl: 1   magnesium gluconate (MAGONATE) 500 MG tablet, Take 500 mg by mouth 2 (two) times daily., Disp: , Rfl:    metoprolol succinate (TOPROL XL) 25 MG 24 hr tablet, Take 1 tablet (25 mg total) by mouth daily., Disp: 90 tablet, Rfl: 1   omeprazole (PRILOSEC) 10 MG capsule, Take 10 mg by mouth daily., Disp: , Rfl:    rosuvastatin (CRESTOR) 5 MG tablet, TAKE 1 TABLET(5 MG) BY MOUTH DAILY, Disp: 90 tablet, Rfl: 1   triamcinolone ointment (KENALOG) 0.5 %, Use twice daily, Disp: 15 g,  Rfl: 0  Consent:   Informed Consent   Shared Decision Making/Informed Consent The risks [chest pain, shortness of breath, cardiac arrhythmias, dizziness, blood pressure fluctuations, myocardial infarction, stroke/transient ischemic attack, nausea, vomiting, allergic reaction, radiation exposure, metallic taste sensation and life-threatening complications (estimated to be 1 in 10,000)], benefits (risk stratification, diagnosing coronary artery disease, treatment guidance) and alternatives of a nuclear stress test were discussed in detail with Ms. Vanness and she agrees to proceed.     Disposition:   6 months sooner if needed-palpitations, valvular heart disease, review test results Patient may be asked to follow-up sooner based on the results of the above-mentioned testing.  Her questions and concerns were addressed to her satisfaction. She voices understanding of the recommendations provided during this encounter.    Signed, Tessa Lerner, DO, [facility]  [facility]  [address] [place], [place] [address] 03/12/2023 5:41 PM

## 2023-03-12 NOTE — Patient Instructions (Addendum)
Medication Instructions:  Your physician has recommended you make the following change in your medication:   1) START metoprolol succinate (Toprol XL) 25 mg daily  *If you need a refill on your cardiac medications before your next appointment, please call your pharmacy*  Lab Work: None ordered today.  Testing/Procedures: Your physician has requested that you have an echocardiogram. Echocardiography is a painless test that uses sound waves to create images of your heart. It provides your doctor with information about the size and shape of your heart and how well your heart's chambers and valves are working. This procedure takes approximately one hour. There are no restrictions for this procedure. Please do NOT wear cologne, perfume, aftershave, or lotions (deodorant is allowed). Please arrive 15 minutes prior to your appointment time.  Please note: We ask at that you not bring children with you during ultrasound (echo/ vascular) testing. Due to room size and safety concerns, children are not allowed in the ultrasound rooms during exams. Our front office staff cannot provide observation of children in our lobby area while testing is being conducted. An adult accompanying a patient to their appointment will only be allowed in the ultrasound room at the discretion of the ultrasound technician under special circumstances. We apologize for any inconvenience.  Your physician has requested that you have en exercise stress myoview. For further information please visit [email or web address] Please follow instruction sheet, as given.   Follow-Up: At [facility], you and your health needs are our priority.  As part of our continuing mission to provide you with exceptional heart care, we have created designated Provider Care Teams.  These Care Teams include your primary Cardiologist (physician) and Advanced Practice Providers (APPs -  Physician Assistants and Nurse Practitioners) who all work together to  provide you with the care you need, when you need it.  Your next appointment:   6 month(s)  The format for your next appointment:   In Person  Provider:   Tessa Lerner, DO {  Other Instructions Exercise Myoview (Stress Test) Instructions  Please arrive 15 minutes prior to your appointment time for registration and insurance purposes.   The test will take approximately 3 to 4 hours to complete; you may bring reading material.  If someone comes with you to your appointment, they will need to remain in the main lobby due to limited space in the testing area. **If you are pregnant or breastfeeding, please notify the nuclear lab prior to your appointment**   How to prepare for your Myocardial Perfusion Test: Do not eat or drink 3 hours prior to your test, except you may have water. Do not consume products containing caffeine (regular or decaffeinated) 12 hours prior to your test. (ex: coffee, chocolate, sodas, tea). Do bring a list of your current medications with you.  If not listed below, you may take your medications as normal. Do wear comfortable clothes (no dresses or overalls) and walking shoes, tennis shoes preferred (No heels or open toe shoes are allowed). Do NOT wear cologne, perfume, aftershave, or lotions (deodorant is allowed). If these instructions are not followed, your test will have to be rescheduled.   Please report to [address] for your test.  If you have questions or concerns about your appointment, you can call the Nuclear Lab at [phone].   If you cannot keep your appointment, please provide 24 hours notification to the Nuclear Lab, to avoid a possible $50 charge to your account.

## 2023-03-14 ENCOUNTER — Telehealth (HOSPITAL_COMMUNITY): Payer: Self-pay | Admitting: *Deleted

## 2023-03-14 NOTE — Telephone Encounter (Signed)
Patient given detailed instructions per Myocardial Perfusion Study Information Sheet for the test on 03/19/2023 at 8:00. Patient notified to arrive 15 minutes early and that it is imperative to arrive on time for appointment to keep from having the test rescheduled.  If you need to cancel or reschedule your appointment, please call the office within 24 hours of your appointment. . Patient verbalized understanding.Daneil Dolin

## 2023-03-19 ENCOUNTER — Ambulatory Visit (HOSPITAL_COMMUNITY): Payer: Medicare Other | Attending: Cardiology

## 2023-03-19 DIAGNOSIS — I2584 Coronary atherosclerosis due to calcified coronary lesion: Secondary | ICD-10-CM | POA: Diagnosis not present

## 2023-03-19 DIAGNOSIS — I2510 Atherosclerotic heart disease of native coronary artery without angina pectoris: Secondary | ICD-10-CM | POA: Insufficient documentation

## 2023-03-19 DIAGNOSIS — I4729 Other ventricular tachycardia: Secondary | ICD-10-CM | POA: Insufficient documentation

## 2023-03-19 LAB — MYOCARDIAL PERFUSION IMAGING
Estimated workload: 1
Exercise duration (min): 1 min
Exercise duration (sec): 0 s
LV dias vol: 53 mL (ref 46–106)
LV sys vol: 11 mL
MPHR: 146 {beats}/min
Nuc Stress EF: 79 %
Peak HR: 126 {beats}/min
Percent HR: 86 %
Rest HR: 85 {beats}/min
Rest Nuclear Isotope Dose: 10.4 mCi
SDS: 4
SRS: 0
SSS: 4
ST Depression (mm): 0 mm
Stress Nuclear Isotope Dose: 30.3 mCi
TID: 0.92

## 2023-03-19 MED ORDER — REGADENOSON 0.4 MG/5ML IV SOLN
0.4000 mg | Freq: Once | INTRAVENOUS | Status: AC
  Administered 2023-03-19: 0.4 mg via INTRAVENOUS

## 2023-03-19 MED ORDER — TECHNETIUM TC 99M TETROFOSMIN IV KIT
10.4000 | PACK | Freq: Once | INTRAVENOUS | Status: AC | PRN
  Administered 2023-03-19: 10.4 via INTRAVENOUS

## 2023-03-19 MED ORDER — TECHNETIUM TC 99M TETROFOSMIN IV KIT
30.3000 | PACK | Freq: Once | INTRAVENOUS | Status: AC | PRN
  Administered 2023-03-19: 30.3 via INTRAVENOUS

## 2023-04-17 ENCOUNTER — Ambulatory Visit (HOSPITAL_COMMUNITY): Payer: Medicare Other | Attending: Cardiology

## 2023-04-17 DIAGNOSIS — R011 Cardiac murmur, unspecified: Secondary | ICD-10-CM | POA: Insufficient documentation

## 2023-04-17 LAB — ECHOCARDIOGRAM COMPLETE
AR max vel: 1.29 cm2
AV Area VTI: 1.4 cm2
AV Area mean vel: 1.36 cm2
AV Mean grad: 12 mm[Hg]
AV Peak grad: 21.8 mm[Hg]
Ao pk vel: 2.34 m/s
Area-P 1/2: 3.15 cm2
S' Lateral: 1.6 cm

## 2023-04-18 IMAGING — DX DG KNEE AP/LAT W/ SUNRISE*R*
3 series · 3 of 3 positions shown · non-contrast
Comparison: None.

CLINICAL DATA: Right knee pain.

EXAM:
RIGHT KNEE 3 VIEWS

[knee ap]
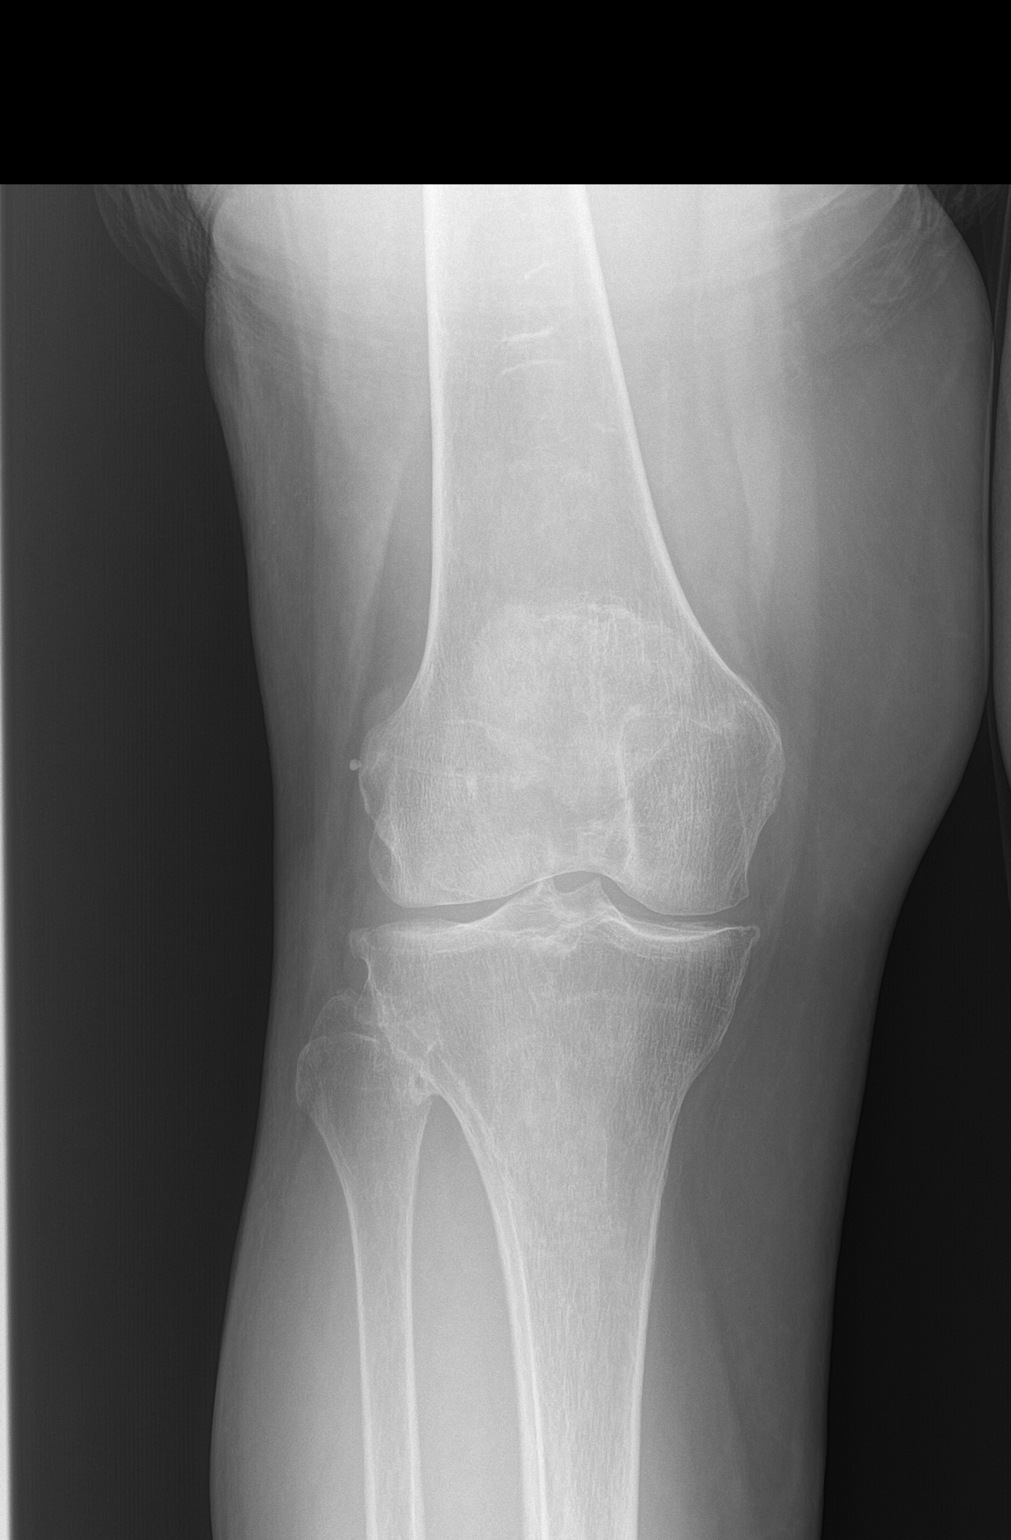

[knee lat]
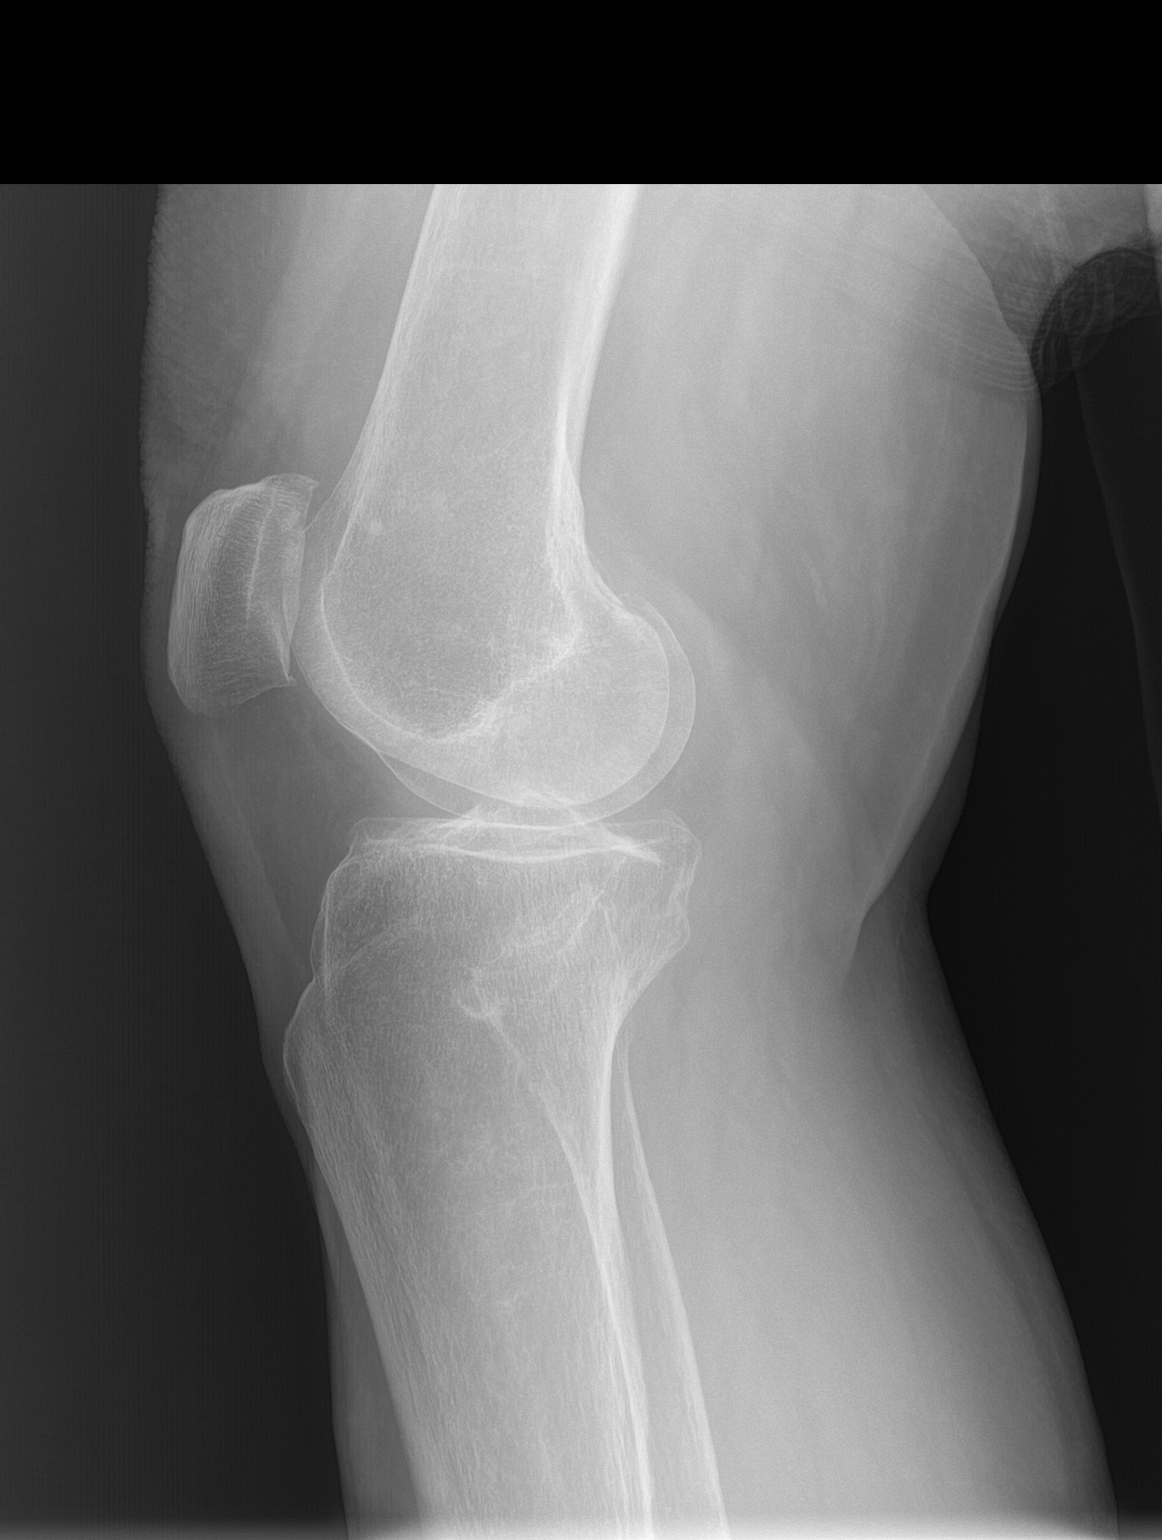

[patella]
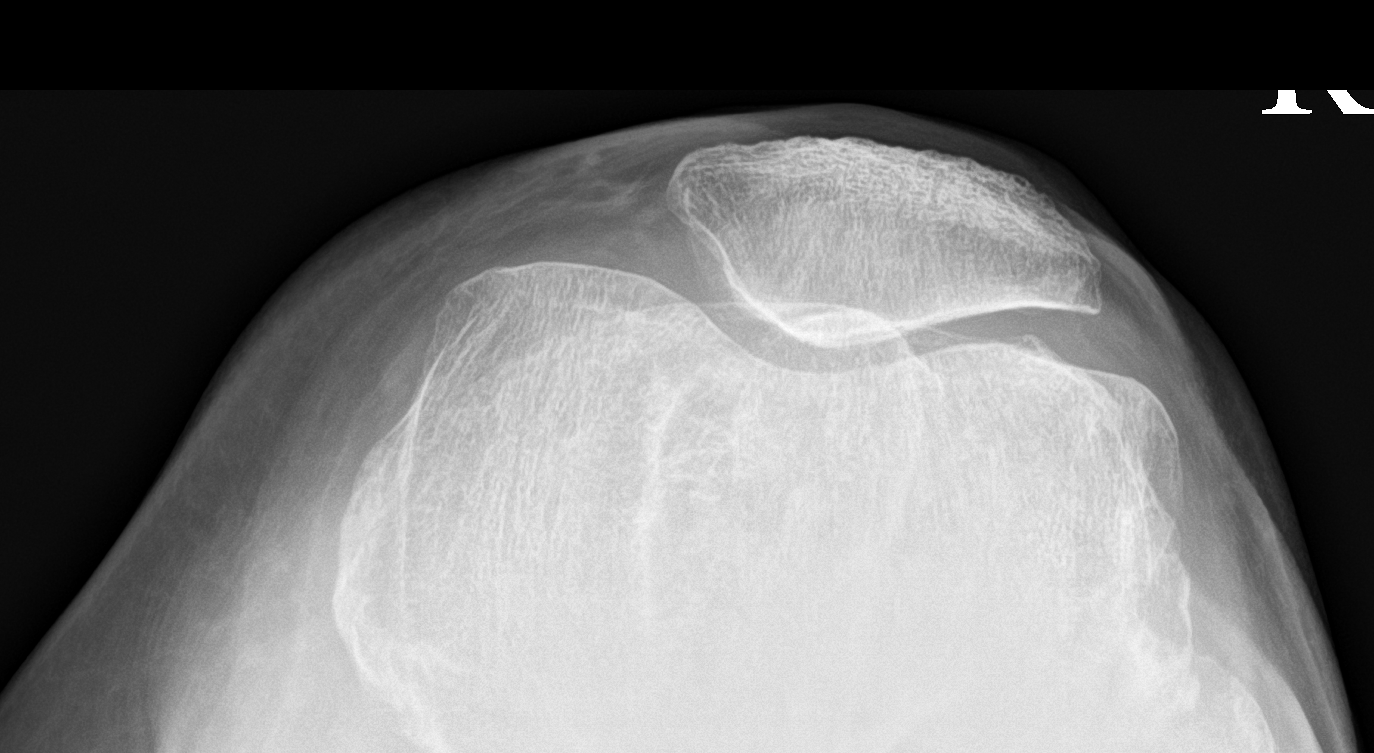

[3 of 3 positions shown; findings below may reference images not displayed]

FINDINGS: There is no acute fracture or dislocation. The bones are osteopenic.
There is mild arthritic changes of the knee with tricompartmental
narrowing. No significant joint effusion. The soft tissues are
unremarkable.
IMPRESSION: 1. No acute fracture or dislocation.
2. Mild arthritic changes of the knee.

## 2023-04-26 ENCOUNTER — Other Ambulatory Visit: Payer: Self-pay | Admitting: Physician Assistant

## 2023-04-28 NOTE — Telephone Encounter (Signed)
 Requesting: GABAPENTIN 300MG  CAPSULES  Last Visit: 12/24/2022 Next Visit: 07/10/2023 Last Refill: 01/20/2023  Please Advise

## 2023-05-07 ENCOUNTER — Other Ambulatory Visit: Payer: Self-pay | Admitting: Physician Assistant

## 2023-05-19 ENCOUNTER — Other Ambulatory Visit: Payer: Self-pay | Admitting: Physician Assistant

## 2023-05-19 ENCOUNTER — Encounter: Payer: Self-pay | Admitting: Physician Assistant

## 2023-05-19 ENCOUNTER — Ambulatory Visit (INDEPENDENT_AMBULATORY_CARE_PROVIDER_SITE_OTHER): Payer: Medicare Other | Admitting: Physician Assistant

## 2023-05-19 VITALS — BP 118/72 | HR 89 | Temp 98.0°F | Ht 64.0 in | Wt 180.0 lb

## 2023-05-19 DIAGNOSIS — E119 Type 2 diabetes mellitus without complications: Secondary | ICD-10-CM

## 2023-05-19 DIAGNOSIS — E039 Hypothyroidism, unspecified: Secondary | ICD-10-CM | POA: Diagnosis not present

## 2023-05-19 DIAGNOSIS — I10 Essential (primary) hypertension: Secondary | ICD-10-CM | POA: Diagnosis not present

## 2023-05-19 DIAGNOSIS — Z7984 Long term (current) use of oral hypoglycemic drugs: Secondary | ICD-10-CM

## 2023-05-19 DIAGNOSIS — R918 Other nonspecific abnormal finding of lung field: Secondary | ICD-10-CM

## 2023-05-19 DIAGNOSIS — G4700 Insomnia, unspecified: Secondary | ICD-10-CM

## 2023-05-19 DIAGNOSIS — E785 Hyperlipidemia, unspecified: Secondary | ICD-10-CM

## 2023-05-19 DIAGNOSIS — R1030 Lower abdominal pain, unspecified: Secondary | ICD-10-CM

## 2023-05-19 LAB — CBC WITH DIFFERENTIAL/PLATELET
Basophils Absolute: 0 10*3/uL (ref 0.0–0.1)
Basophils Relative: 0.5 % (ref 0.0–3.0)
Eosinophils Absolute: 0.1 10*3/uL (ref 0.0–0.7)
Eosinophils Relative: 0.9 % (ref 0.0–5.0)
HCT: 46.3 % — ABNORMAL HIGH (ref 36.0–46.0)
Hemoglobin: 15.1 g/dL — ABNORMAL HIGH (ref 12.0–15.0)
Lymphocytes Relative: 17.2 % (ref 12.0–46.0)
Lymphs Abs: 1.4 10*3/uL (ref 0.7–4.0)
MCHC: 32.5 g/dL (ref 30.0–36.0)
MCV: 87.7 fL (ref 78.0–100.0)
Monocytes Absolute: 0.6 10*3/uL (ref 0.1–1.0)
Monocytes Relative: 7.6 % (ref 3.0–12.0)
Neutro Abs: 5.9 10*3/uL (ref 1.4–7.7)
Neutrophils Relative %: 73.8 % (ref 43.0–77.0)
Platelets: 198 10*3/uL (ref 150.0–400.0)
RBC: 5.28 Mil/uL — ABNORMAL HIGH (ref 3.87–5.11)
RDW: 13.6 % (ref 11.5–15.5)
WBC: 8 10*3/uL (ref 4.0–10.5)

## 2023-05-19 LAB — COMPREHENSIVE METABOLIC PANEL
ALT: 14 U/L (ref 0–35)
AST: 15 U/L (ref 0–37)
Albumin: 4.7 g/dL (ref 3.5–5.2)
Alkaline Phosphatase: 37 U/L — ABNORMAL LOW (ref 39–117)
BUN: 17 mg/dL (ref 6–23)
CO2: 27 meq/L (ref 19–32)
Calcium: 10 mg/dL (ref 8.4–10.5)
Chloride: 98 meq/L (ref 96–112)
Creatinine, Ser: 0.7 mg/dL (ref 0.40–1.20)
GFR: 85.03 mL/min (ref >60.00)
Glucose, Bld: 219 mg/dL — ABNORMAL HIGH (ref 70–99)
Potassium: 3.8 meq/L (ref 3.5–5.1)
Sodium: 136 meq/L (ref 135–145)
Total Bilirubin: 0.6 mg/dL (ref 0.2–1.2)
Total Protein: 7.6 g/dL (ref 6.0–8.3)

## 2023-05-19 LAB — LIPID PANEL
Cholesterol: 193 mg/dL (ref 0–200)
HDL: 55.4 mg/dL (ref >39.00)
LDL Cholesterol: 101 mg/dL — ABNORMAL HIGH (ref 0–99)
NonHDL: 137.99
Total CHOL/HDL Ratio: 3
Triglycerides: 183 mg/dL — ABNORMAL HIGH (ref 0.0–149.0)
VLDL: 36.6 mg/dL (ref 0.0–40.0)

## 2023-05-19 LAB — TSH: TSH: 2.42 u[IU]/mL (ref 0.35–5.50)

## 2023-05-19 LAB — HEMOGLOBIN A1C: Hgb A1c MFr Bld: 10.7 % — ABNORMAL HIGH (ref 4.6–6.5)

## 2023-05-19 MED ORDER — TRAZODONE HCL 50 MG PO TABS: 25.0000 mg | ORAL_TABLET | Freq: Every evening | ORAL | 3 refills | Status: DC | PRN

## 2023-05-19 NOTE — Progress Notes (Signed)
Amanda Rollins is a 73 y.o. female here for a follow up of a pre-existing problem.  History of Present Illness:   Chief Complaint  Patient presents with   Hernia    Pt c/o having lower abdominal hernia discomfort off and on recently.    HPI  Hernia  She complains of intermittent lower abdominal discomfort due to an abdominal hernia. She does report having increased symptom(s) when having a BM. Other movements don't seem to impact her or trigger her symptoms.  Symptoms have become more often than regular and she's unsure of   She reports being diagnosed with a hernia in 2002. She did not undergo a repair per surgeon's recommendations as she was not having significant symptom(s). Constipation symptoms have improved over that past 3 months.   Diabetes  She does report losing about 12 pounds not intentionally.   She does report compliance and good tolerance of Jardiancen 10 mg daily.  She plans on walking after meals to lower blood sugar levels.  No complains or symptoms at this time   Insomnia  She does report experiencing insomnia symptoms.  She was unable to sleep till 3 am last night. She denies any naps or any changes in caffeine intake. She's taking magnesium daily. She reports taking melatonin previously but it's had lost it's effectiveness.  She's tried Ambien but did experience side effects.  She does report taking gabapentin and tylenol for nerve and restless leg which also helps with sleep.   Abnormal CT of chest Nodules found on chest CT in Jan 2024 Needs repeat imaging due to " Scattered pulmonary nodules bilaterally measuring up to 6 mm. Non-contrast chest CT at 6-12 months is recommended. If the nodule is stable at time of repeat CT, then future CT at 18-24 months (from today's scan) is considered optional for low-risk patients, but is recommended for high-risk patients. This recommendation follows the consensus statement: Guidelines for Management of  Incidental Pulmonary Nodules Detected on CT Images: From the Fleischner Society 2017; Radiology 2017; 284:228-243."  HTN Currently taking losartan-hctz 100-12.5 mg and toprol xl 25 mg daily. At home blood pressure readings are: normal. Patient denies chest pain, SOB, blurred vision, dizziness, unusual headaches, lower leg swelling. Patient is  compliant with medication. Denies excessive caffeine intake, stimulant usage, excessive alcohol intake, or increase in salt consumption.  BP Readings from Last 3 Encounters:  05/19/23 118/72  03/12/23 138/78  12/24/22 118/80   Hypothyroidism Currently taking 50 mcg levothyroxine  Tolerating well  HLD Currently taking crestor 5 mg daily Tolerating well  Past Medical History:  Diagnosis Date   Breast cancer (HCC) 2011   Diabetes mellitus type 2, controlled (HCC)    Heart murmur    Hyperlipidemia    Hypertension    Hypothyroidism      Social History   Tobacco Use   Smoking status: Former    Types: Cigarettes    Start date: 02/07/1970   Smokeless tobacco: Never  Substance Use Topics   Alcohol use: Not Currently    Alcohol/week: 3.0 standard drinks of alcohol    Types: 3 Glasses of wine per week   Drug use: Never    Past Surgical History:  Procedure Laterality Date   ABDOMINAL HYSTERECTOMY  2000   Still has ovaries   BELPHAROPTOSIS REPAIR  07/2019   BREAST SURGERY     CATARACT EXTRACTION  2019   Left eye 2019, Right eye 2020   CESAREAN SECTION  12/08/1990   MASTECTOMY  2011   Bilateral   ROBOTIC ASSISTED LAPAROSCOPIC VAGINAL HYSTERECTOMY WITH FIBROID REMOVAL  1991    Family History  Adopted: Yes  Problem Relation Age of Onset   Cancer Mother        small cell cancer of liver and lungs    Allergies  Allergen Reactions   Sulfa Antibiotics Rash   Metformin Diarrhea    Current Medications:   Current Outpatient Medications:    alendronate (FOSAMAX) 70 MG tablet, Take 1 tablet (70 mg total) by mouth once a week.  Take with a full glass of water on an empty stomach., Disp: 12 tablet, Rfl: 3   calcium-vitamin D (OSCAL WITH D) 250-125 MG-UNIT tablet, Take 1 tablet by mouth daily., Disp: , Rfl:    empagliflozin (JARDIANCE) 10 MG TABS tablet, Take 1 tablet (10 mg total) by mouth daily., Disp: 90 tablet, Rfl: 3   gabapentin (NEURONTIN) 300 MG capsule, TAKE 1 CAPSULE(300 MG) BY MOUTH AT BEDTIME, Disp: 90 capsule, Rfl: 0   levothyroxine (SYNTHROID) 50 MCG tablet, TAKE 1 TABLET(50 MCG) BY MOUTH DAILY, Disp: 90 tablet, Rfl: 1   losartan-hydrochlorothiazide (HYZAAR) 100-12.5 MG tablet, TAKE 1 TABLET BY MOUTH DAILY, Disp: 90 tablet, Rfl: 1   magnesium gluconate (MAGONATE) 500 MG tablet, Take 500 mg by mouth 2 (two) times daily., Disp: , Rfl:    metoprolol succinate (TOPROL XL) 25 MG 24 hr tablet, Take 1 tablet (25 mg total) by mouth daily., Disp: 90 tablet, Rfl: 1   omeprazole (PRILOSEC) 10 MG capsule, Take 10 mg by mouth daily., Disp: , Rfl:    rosuvastatin (CRESTOR) 5 MG tablet, TAKE 1 TABLET(5 MG) BY MOUTH DAILY, Disp: 90 tablet, Rfl: 1   triamcinolone ointment (KENALOG) 0.5 %, Use twice daily, Disp: 15 g, Rfl: 0   Review of Systems:   Review of Systems  Gastrointestinal:  Positive for abdominal pain.  Psychiatric/Behavioral:  The patient has insomnia.   Negative unless otherwise specified per HPI.  Vitals:   Vitals:   05/19/23 1135  BP: 118/72  Pulse: 89  Temp: 98 F (36.7 C)  TempSrc: Temporal  SpO2: 97%  Weight: 180 lb (81.6 kg)  Height: 5\' 4"  (1.626 m)     Body mass index is 30.9 kg/m.  Physical Exam:   Physical Exam Vitals and nursing note reviewed.  Constitutional:      General: She is not in acute distress.    Appearance: She is well-developed. She is not ill-appearing or toxic-appearing.  Cardiovascular:     Rate and Rhythm: Normal rate and regular rhythm.     Pulses: Normal pulses.     Heart sounds: Normal heart sounds, S1 normal and S2 normal.  Pulmonary:     Effort: Pulmonary  effort is normal.     Breath sounds: Normal breath sounds.  Abdominal:     Comments: Tenderness to palpation to abdominal wall approximately located at 5 oclock of umbilicus   Skin:    General: Skin is warm and dry.  Neurological:     Mental Status: She is alert.     GCS: GCS eye subscore is 4. GCS verbal subscore is 5. GCS motor subscore is 6.  Psychiatric:        Speech: Speech normal.        Behavior: Behavior normal. Behavior is cooperative.     Assessment and Plan:   Primary hypertension Normotensive Continue losartan-hctz 100-12.5 mg and toprol xl 25 mg daily Follow-up in 3-6 month(s), sooner if concerns  Acquired hypothyroidism Update TSH and adjust levothyroxine 50 mcg daily Follow-up frequency based on results  Controlled type 2 diabetes mellitus without complication, without long-term current use of insulin (HCC); Diabetes mellitus treated with oral medication (HCC) Update A1c She is very reluctant to do any sort of injectable Continue jardiance 10 mg and make recommendations accordingly Follow-up in 3-6 month(s), sooner if concerns  Lung nodules Update CT scan as recommended  Lower abdominal pain Will order CT of abdomen/pelvis for further evaluation and management of area of point tenderness If new/worsening symptom(s), will change to stat No acute abdomen or concerns for strangulation at this time Recommend working on avoiding constipation  Insomnia, unspecified type Uncontrolled Trial trazodone 25-50 mg daily Follow-up in 3-6 month(s), sooner if concerns  Hyperlipidemia, unspecified hyperlipidemia type Update lipid pnale and adjust crestor 5 mg daily as indicated   Jarold Motto, PA-C  I,Safa M Kadhim,acting as a Neurosurgeon for [employer], PA.,have documented all relevant documentation on the behalf of Jarold Motto, PA,as directed by  Jarold Motto, PA while in the presence of Jarold Motto, [place].   I, Jarold Motto, [place], have reviewed all  documentation for this visit. The documentation on 05/19/23 for the exam, diagnosis, procedures, and orders are all accurate and complete.

## 2023-05-21 ENCOUNTER — Encounter: Payer: Self-pay | Admitting: Physician Assistant

## 2023-05-21 MED ORDER — EMPAGLIFLOZIN 25 MG PO TABS: 25.0000 mg | ORAL_TABLET | Freq: Every day | ORAL | 1 refills | Status: DC

## 2023-05-28 ENCOUNTER — Ambulatory Visit
Admission: RE | Admit: 2023-05-28 | Discharge: 2023-05-28 | Disposition: A | Discharge status: Home / Self Care | Payer: Medicare Other | Source: Ambulatory Visit | Attending: Physician Assistant

## 2023-05-28 DIAGNOSIS — R109 Unspecified abdominal pain: Secondary | ICD-10-CM | POA: Diagnosis not present

## 2023-05-28 DIAGNOSIS — R1030 Lower abdominal pain, unspecified: Secondary | ICD-10-CM

## 2023-05-28 DIAGNOSIS — R918 Other nonspecific abnormal finding of lung field: Secondary | ICD-10-CM

## 2023-05-28 DIAGNOSIS — Z853 Personal history of malignant neoplasm of breast: Secondary | ICD-10-CM | POA: Diagnosis not present

## 2023-05-28 MED ORDER — IOPAMIDOL (ISOVUE-300) INJECTION 61%
100.0000 mL | Freq: Once | INTRAVENOUS | Status: AC | PRN
  Administered 2023-05-28: 100 mL via INTRAVENOUS

## 2023-06-02 ENCOUNTER — Encounter: Payer: Self-pay | Admitting: Physician Assistant

## 2023-06-02 DIAGNOSIS — K439 Ventral hernia without obstruction or gangrene: Secondary | ICD-10-CM

## 2023-06-03 ENCOUNTER — Encounter: Payer: Self-pay | Admitting: Physician Assistant

## 2023-06-08 ENCOUNTER — Other Ambulatory Visit: Payer: Self-pay | Admitting: Physician Assistant

## 2023-06-12 ENCOUNTER — Other Ambulatory Visit: Payer: Self-pay | Admitting: Cardiology

## 2023-06-12 DIAGNOSIS — I4729 Other ventricular tachycardia: Secondary | ICD-10-CM

## 2023-06-12 DIAGNOSIS — R002 Palpitations: Secondary | ICD-10-CM

## 2023-06-13 DIAGNOSIS — K432 Incisional hernia without obstruction or gangrene: Secondary | ICD-10-CM | POA: Diagnosis not present

## 2023-06-23 ENCOUNTER — Other Ambulatory Visit: Payer: Self-pay | Admitting: Physician Assistant

## 2023-06-24 ENCOUNTER — Other Ambulatory Visit: Payer: Self-pay | Admitting: Physician Assistant

## 2023-07-07 DIAGNOSIS — K080 Exfoliation of teeth due to systemic causes: Secondary | ICD-10-CM | POA: Diagnosis not present

## 2023-07-28 ENCOUNTER — Encounter: Payer: Self-pay | Admitting: Physician Assistant

## 2023-07-28 ENCOUNTER — Ambulatory Visit (INDEPENDENT_AMBULATORY_CARE_PROVIDER_SITE_OTHER): Admitting: Physician Assistant

## 2023-07-28 VITALS — BP 110/70 | HR 79 | Temp 97.7°F | Ht 64.0 in | Wt 180.4 lb

## 2023-07-28 DIAGNOSIS — S0990XA Unspecified injury of head, initial encounter: Secondary | ICD-10-CM

## 2023-07-28 DIAGNOSIS — G8929 Other chronic pain: Secondary | ICD-10-CM

## 2023-07-28 DIAGNOSIS — M25511 Pain in right shoulder: Secondary | ICD-10-CM | POA: Diagnosis not present

## 2023-07-28 DIAGNOSIS — M25512 Pain in left shoulder: Secondary | ICD-10-CM | POA: Diagnosis not present

## 2023-07-28 DIAGNOSIS — E119 Type 2 diabetes mellitus without complications: Secondary | ICD-10-CM | POA: Diagnosis not present

## 2023-07-28 DIAGNOSIS — Z7984 Long term (current) use of oral hypoglycemic drugs: Secondary | ICD-10-CM

## 2023-07-28 MED ORDER — GABAPENTIN 300 MG PO CAPS: 300.0000 mg | ORAL_CAPSULE | Freq: Every day | ORAL | 1 refills | Status: DC

## 2023-07-28 NOTE — Patient Instructions (Signed)
 It was great to see you!  Keep icing your head If headache(s), vision changes, or other concerns, let me know and we will get imaging  Physical therapy referral placed today  Take care,  Jarold Motto PA-C

## 2023-07-28 NOTE — Progress Notes (Signed)
 Amanda Rollins is a 73 y.o. female here for a new problem.  History of Present Illness:   Chief Complaint  Patient presents with   Wound Check    Pt has wound on her scalp, fell on Friday. Had a huge bump and iced the area.   Shoulder Pain    Pt c/o bilateral shoulder pain, worse past 6 months R>L, having trouble when moving limited mobility.    HPI  Head inury Pt complains of a wound and a bump on her scalp following a fall on Friday 4/4. She reports she got her foot caught on something causing her to fall.  Endorses some bleeding, stating she hit her head on the oven door.  At the time she was holding her grandchild and making a bottle; she adds making sure her granddaughter did not hit the floor was her main priority.  She iced her head on Friday, which helped with pain and reducing the size of the bump her head.  Since then she has not iced her wound.  Has not washed her hair since her fall.  Denies any loss of consciousness, headache, vision changes, vomiting, or nausea.  No currently on any blood thinners.   Shoulder pain : Pt complains of bilateral shoulder pain with associated dull pain, burning sensation, and soreness.  Her pain has worsened in the last 6 months, R>L.  Rates her current pain a 3/10.  She reports limited range when raising her arms, which she partly attributes to her hx of lymphedema.  Her lymphedema is currently stable, and is best controlled when it is cold outside.  Also has lost "a little" grip strength with shoulder pain.  She takes Tylenol daily for pain management.  She notes her shoulder pain started around the time she fractured her right humerus in 2011. No rashes on her arms or shoulders.   Type 2 Diabetes: Pt is on Jardiance 25 mg once daily.  Good compliance and tolerance with no adverse side effects reported.  Not having urinary frequency or any other urinary symptoms.  States her urine occasionally has a "funky" smell. When this  occurs she drinks cranberry juice and water, which tends to resolve the issue.   Past Medical History:  Diagnosis Date   Breast cancer (HCC) 2011   Diabetes mellitus type 2, controlled (HCC)    Heart murmur    Hyperlipidemia    Hypertension    Hypothyroidism      Social History   Tobacco Use   Smoking status: Former    Types: Cigarettes    Start date: 02/07/1970   Smokeless tobacco: Never  Substance Use Topics   Alcohol use: Not Currently    Alcohol/week: 3.0 standard drinks of alcohol    Types: 3 Glasses of wine per week   Drug use: Never    Past Surgical History:  Procedure Laterality Date   ABDOMINAL HYSTERECTOMY  2000   Still has ovaries   BELPHAROPTOSIS REPAIR  07/2019   BREAST SURGERY     CATARACT EXTRACTION  2019   Left eye 2019, Right eye 2020   CESAREAN SECTION  12/08/1990   MASTECTOMY  2011   Bilateral   ROBOTIC ASSISTED LAPAROSCOPIC VAGINAL HYSTERECTOMY WITH FIBROID REMOVAL  1991    Family History  Adopted: Yes  Problem Relation Age of Onset   Cancer Mother        small cell cancer of liver and lungs    Allergies  Allergen Reactions   Sulfa  Antibiotics Rash   Metformin Diarrhea    Current Medications:   Current Outpatient Medications:    alendronate (FOSAMAX) 70 MG tablet, TAKE 1 TABLET(70 MG) BY MOUTH 1 TIME A WEEK WITH A FULL GLASS OF WATER AND ON AN EMPTY STOMACH, Disp: 12 tablet, Rfl: 3   calcium-vitamin D (OSCAL WITH D) 250-125 MG-UNIT tablet, Take 1 tablet by mouth daily., Disp: , Rfl:    empagliflozin (JARDIANCE) 25 MG TABS tablet, Take 1 tablet (25 mg total) by mouth daily., Disp: 90 tablet, Rfl: 1   levothyroxine (SYNTHROID) 50 MCG tablet, TAKE 1 TABLET(50 MCG) BY MOUTH DAILY, Disp: 90 tablet, Rfl: 1   losartan-hydrochlorothiazide (HYZAAR) 100-12.5 MG tablet, TAKE 1 TABLET BY MOUTH DAILY, Disp: 90 tablet, Rfl: 1   magnesium gluconate (MAGONATE) 500 MG tablet, Take 500 mg by mouth 2 (two) times daily., Disp: , Rfl:    metoprolol  succinate (TOPROL-XL) 25 MG 24 hr tablet, TAKE 1 TABLET(25 MG) BY MOUTH DAILY, Disp: 90 tablet, Rfl: 2   omeprazole (PRILOSEC) 10 MG capsule, Take 10 mg by mouth daily., Disp: , Rfl:    rosuvastatin (CRESTOR) 5 MG tablet, TAKE 1 TABLET(5 MG) BY MOUTH DAILY, Disp: 90 tablet, Rfl: 1   traZODone (DESYREL) 50 MG tablet, Take 0.5-1 tablets (25-50 mg total) by mouth at bedtime as needed for sleep., Disp: 30 tablet, Rfl: 3   gabapentin (NEURONTIN) 300 MG capsule, Take 1 capsule (300 mg total) by mouth at bedtime., Disp: 90 capsule, Rfl: 1   Review of Systems:   Negative unless otherwise specified per HPI.  Vitals:   Vitals:   07/28/23 0938  BP: 110/70  Pulse: 79  Temp: 97.7 F (36.5 C)  TempSrc: Temporal  SpO2: 97%  Weight: 180 lb 6.1 oz (81.8 kg)  Height: 5\' 4"  (1.626 m)     Body mass index is 30.96 kg/m.  Physical Exam:   Physical Exam Vitals and nursing note reviewed.  Constitutional:      General: She is not in acute distress.    Appearance: She is well-developed. She is not ill-appearing or toxic-appearing.  Cardiovascular:     Rate and Rhythm: Normal rate and regular rhythm.     Pulses: Normal pulses.     Heart sounds: Normal heart sounds, S1 normal and S2 normal.  Pulmonary:     Effort: Pulmonary effort is normal.     Breath sounds: Normal breath sounds.  Skin:    General: Skin is warm and dry.     Comments: Raised swollen area of tenderness to palpation to crown of head with well healing scabs; no purulent drainage  Neurological:     General: No focal deficit present.     Mental Status: She is alert.     GCS: GCS eye subscore is 4. GCS verbal subscore is 5. GCS motor subscore is 6.     Cranial Nerves: Cranial nerves 2-12 are intact.     Sensory: Sensation is intact.     Motor: Motor function is intact.     Coordination: Coordination is intact.     Gait: Gait is intact.  Psychiatric:        Speech: Speech normal.        Behavior: Behavior normal. Behavior is  cooperative.     Assessment and Plan:   1. Chronic pain of both shoulders (Primary) Suspect that this is due to ongoing childcare commitments of infant and toddler Offered imaging due to history of cancer but she declined Continue tylenol  as needed Start physical therapy Follow up with [place] or sports medicine if lack of improvement or any concerns - Ambulatory referral to Physical Therapy  2. Controlled type 2 diabetes mellitus without complication, without long-term current use of insulin (HCC) Too soon to recheck A1c Continue to monitor carb intake and work on healthy eating Continue jardiance 25 mg Follow up in 1-2 months, sooner if concerns  3. Injury of head, initial encounter No red flags Recommend continued ice and tylenol No indications for imaging at this time Worsening precautions advised in the interim  I, Isabelle Course, acting as a Neurosurgeon for Jarold Motto, [place]., have documented all relevant documentation on the behalf of Jarold Motto, [place], as directed by  Jarold Motto, PA while in the presence of Jarold Motto, [place].  I, Jarold Motto, [place], have reviewed all documentation for this visit. The documentation on 07/28/23 for the exam, diagnosis, procedures, and orders are all accurate and complete.  Jarold Motto, PA-C

## 2023-08-21 NOTE — Therapy (Unsigned)
 OUTPATIENT PHYSICAL THERAPY CERVICAL EVALUATION   Patient Name: Amanda Rollins MRN: [id number] DOB:21-Jan-1951, 73 y.o., female Today's Date: 08/25/2023  END OF SESSION:  PT End of Session - 08/25/23 1256     Visit Number 1    Authorization Type medicare BCBS no auth    Progress Note Due on Visit 10    PT Start Time 1300    PT Stop Time 1346    PT Time Calculation (min) 46 min    Activity Tolerance Patient limited by pain    Behavior During Therapy [facility] for tasks assessed/performed             Past Medical History:  Diagnosis Date   Breast cancer (HCC) 2011   Diabetes mellitus type 2, controlled (HCC)    Heart murmur    Hyperlipidemia    Hypertension    Hypothyroidism    Past Surgical History:  Procedure Laterality Date   ABDOMINAL HYSTERECTOMY  2000   Still has ovaries   BELPHAROPTOSIS REPAIR  07/2019   BREAST SURGERY     CATARACT EXTRACTION  2019   Left eye 2019, Right eye 2020   CESAREAN SECTION  12/08/1990   MASTECTOMY  2011   Bilateral   ROBOTIC ASSISTED LAPAROSCOPIC VAGINAL HYSTERECTOMY WITH FIBROID REMOVAL  1991   Patient Active Problem List   Diagnosis Date Noted   History of breast cancer 04/10/2022   Osteoporosis 04/10/2022   Depression, major, single episode, mild (HCC) 04/05/2020   Lymphedema 02/08/2020   Pulmonary nodules 02/08/2020   Atherosclerosis of abdominal aorta (HCC) 02/08/2020   Hypothyroidism    Hypertension    Hyperlipidemia    Heart murmur    Diabetes mellitus type 2, controlled (HCC)    Breast cancer (HCC) 2011    PCP: Alexander Iba, PA   REFERRING PROVIDER: Alexander Iba, PA   REFERRING DIAG: M25.511,G89.29,M25.512 (ICD-10-CM) - Chronic pain of both shoulders  THERAPY DIAG:  Chronic pain of both shoulders  Muscle weakness (generalized)  Rationale for Evaluation and Treatment: Rehabilitation  ONSET DATE: > 7 months ago  SUBJECTIVE:                                                                                                                                                                                                          SUBJECTIVE STATEMENT: 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 Reports during that time she fell on her right shoulder and  broker her right humerus. She has base line lymphedema in her R UE that she manages but her compression sleeve is > 64 year old and she doesn't use her pump regularly due to the time restraints.  She has has 2 grand children that are 58.8 years old and 50 months old and she lifts them a lot. It hurts when she sleeps on her side.  She also notes occasional pelvic pain and incontinence.     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   Hand dominance: Right  PERTINENT HISTORY:  DB, HTN, cesarean section, B mastectomy, lymphedema history, breast cancer in remission, abdominal hernia, back pain, osteoporosis   PAIN:  Are you having pain? Yes: NPRS scale: 5/10, current pain is 2/10 Pain location: B lateral shoulders and in the joints Pain description: achy Aggravating factors: reaching a certain way, sleeping on her sides,  Relieving factors: tylenol  PRECAUTIONS: None  RED FLAGS: None     WEIGHT BEARING RESTRICTIONS: No  FALLS:  Has patient fallen in last 6 months? no     OCCUPATION: house sits dogs  PLOF: Independent  PATIENT GOALS: to have less pain    OBJECTIVE:  Note: Objective measures were completed at Evaluation unless otherwise noted.  DIAGNOSTIC FINDINGS:  No recent imaging   PATIENT SURVEYS:  Patient-specific activity functional scoring scheme (Point to one number):  "0" represents "unable to perform." "10" represents "able to perform at prior level. 0 1 2 3 4 5 6 7 8 9   10 (Date and Score) Activity Initial  Activity Eval     Reaching up (putting things away)  3    Washing back (reaching behind) 3     Sleeping on her side 3    Additional Additional Total score = sum of the activity scores/number of activities Minimum detectable change (90%CI) for average score = 2 points Minimum detectable change (90%CI) for single activity score = 3 points PSFS developed by: [place] Spitz., & Binkley, J. (1995). Assessing disability and change on individual  patients: a report of a patient specific measure. Physiotherapy [place], 47, [phone]. Reproduced with the permission of the authors  Score: 3   COGNITION: Overall cognitive status: Within functional limits for tasks assessed  SENSATION: Not tested  POSTURE/observation: rounded shoulders, forward head, posterior pelvic tilt, and flexed trunk , R UE swelling throughout- stretched out compression sleeve in place.  PALPATION: Increased tenderness to palpation along right shoulder girdle, shoulder and right trunk    CERVICAL ROM:   Active ROM A/PROM (deg) eval  Flexion WFL  Extension 20*  Right lateral flexion   Left lateral flexion   Right rotation 60*  Left rotation 30*   (Blank rows = not tested) *tight/pulling    UE Measurements Upper Extremity Right EVAL Left EVAL   A/PROM MMT A/PROM MMT  Shoulder Flexion 80* NT* 140* 3+*  Shoulder Extension      Shoulder Abduction 80* NT* WFL^ 3+*  Shoulder Adduction      Shoulder Internal Rotation      Shoulder External Rotation      Elbow Flexion      Elbow Extension      Wrist Flexion      Wrist Extension      Wrist Supination      Wrist Pronation  Wrist Ulnar Deviation      Wrist Radial Deviation      Grip Strength NA  NA     (Blank rows = not tested)   * pain ^ tight     TREATMENT DATE:                                                                                                                                08/25/2023  Therapeutic Exercise:  Aerobic: Supine: Prone:  Seated:self massage - educated and practiced to right arm and trunk  Standing: shoulder flexion up wall - pain stopped, pulleys scaption - pain stopped  Neuromuscular Re-education:posture education and sitting on sit bones with head up - 5 minutes, scapular retraction for posture and improved shoulder position x10 5" hlds Manual Therapy: STM and IASTM with percussion gun to right shoulder and scapular region - tolerated well - used towel for padding Therapeutic Activity: Self Care: on getting new compression sleeve as current sleeve is stretched out - provided patient with elastic therapy outlet information -3 minutes Trigger Point Dry Needling:  Modalities:     PATIENT EDUCATION:  Education details: on current presentation, on HEP, on clinical outcomes score and POC Person educated: Patient Education method: Explanation, Demonstration, and Handouts Education comprehension: verbalized understanding   HOME EXERCISE PROGRAM: [id number]  ASSESSMENT:  CLINICAL IMPRESSION: Patient presents to PT with complaints of chronic  B shoulder pain that has worsening over time. Complex medical history of B mastectomy, right lymphedema, and history of chemo/radiation to R UE contributing to current presentation. Patient with increased swelling in right UE, ROM and strength limitations bilaterally with R>L and postural limitation that are likely contributing to current presentation. Patient would greatly benefit from skilled PT to address physical impairments and return her to optimal function.   OBJECTIVE IMPAIRMENTS: decreased activity tolerance, decreased ROM, decreased strength, increased edema, impaired UE functional use, improper body mechanics, postural dysfunction, and pain.   ACTIVITY LIMITATIONS: carrying, lifting, sleeping, bathing, dressing, reach over head, and hygiene/grooming  PARTICIPATION LIMITATIONS: meal prep and  cleaning  PERSONAL FACTORS: Fitness, Time since onset of injury/illness/exacerbation, and 1-2 comorbidities: B mastectomy, right UE lymphedema  are also affecting patient's functional outcome.   REHAB POTENTIAL: Good  CLINICAL DECISION MAKING: Evolving/moderate complexity  EVALUATION COMPLEXITY: Moderate   GOALS: Goals reviewed with patient? yes  SHORT TERM GOALS: Target date: 10/06/2023   Patient will be independent in self management strategies to improve quality of life and functional outcomes. Baseline: New Program Goal status: INITIAL  2.  Patient will report at least 50% improvement in overall symptoms and/or function to demonstrate improved functional mobility Baseline: 0% better Goal status: INITIAL  3.  Patient will report using her pump at least 3x a week to help manage the fluid in her R UE  Baseline: not currently Goal status: INITIAL  4.  Patient will report sitting in good posture intermittently throughout the day to reduce stress  on neck and shoulder Baseline: not currently Goal status: INITIAL    LONG TERM GOALS: Target date: 11/17/2023    Patient will report at least 75% improvement in overall symptoms and/or function to demonstrate improved functional mobility Baseline: 0% better Goal status: INITIAL  2.  Patient will score at least 2 points higher on PSFS average to demonstrate change in overall function. Baseline: see above Goal status: INITIAL  3.  Patent will demonstrate at least 90 degrees of right shoulder scaption to improve shoulder mobility in R UE Baseline:  Goal status: INITIAL       PLAN:  PT FREQUENCY: 1-2x/week for a total of 16 visits over 12 week certification period   PT DURATION: 12 weeks  PLANNED INTERVENTIONS: 97110-Therapeutic exercises, 97530- Therapeutic activity, 97112- Neuromuscular re-education, 97535- Self Care, 16109- Manual therapy, [phone]- Gait training, [phone]- Orthotic Fit/training, [phone]- Canalith repositioning,  [id number]- Aquatic Therapy, 97014- Electrical stimulation (unattended), [phone]- Ionotophoresis 4mg /ml Dexamethasone, Patient/Family education, Balance training, Stair training, Taping, Dry Needling, Joint mobilization, Joint manipulation, Spinal manipulation, Spinal mobilization, Cryotherapy, and Moist heat   PLAN FOR NEXT SESSION: shoulder ROM, manual/ STM, try KT tape to shoulder posture, breathing, core work, f/u with compression sleeve and pump   2:14 PM, 08/25/23 Tabitha Ewings, DPT Physical Therapy with [facility]

## 2023-08-25 ENCOUNTER — Ambulatory Visit: Admitting: Physical Therapy

## 2023-08-25 ENCOUNTER — Encounter: Payer: Self-pay | Admitting: Physical Therapy

## 2023-08-25 ENCOUNTER — Ambulatory Visit: Payer: Medicare Other

## 2023-08-25 DIAGNOSIS — M25512 Pain in left shoulder: Secondary | ICD-10-CM

## 2023-08-25 DIAGNOSIS — G8929 Other chronic pain: Secondary | ICD-10-CM

## 2023-08-25 DIAGNOSIS — M25511 Pain in right shoulder: Secondary | ICD-10-CM | POA: Diagnosis not present

## 2023-08-25 DIAGNOSIS — M6281 Muscle weakness (generalized): Secondary | ICD-10-CM

## 2023-08-29 ENCOUNTER — Other Ambulatory Visit: Payer: Self-pay | Admitting: Physician Assistant

## 2023-09-09 ENCOUNTER — Encounter: Admitting: Physical Therapy

## 2023-09-12 ENCOUNTER — Other Ambulatory Visit: Payer: Self-pay | Admitting: Cardiology

## 2023-09-12 DIAGNOSIS — R002 Palpitations: Secondary | ICD-10-CM

## 2023-09-12 DIAGNOSIS — I4729 Other ventricular tachycardia: Secondary | ICD-10-CM

## 2023-09-17 ENCOUNTER — Encounter: Payer: Self-pay | Admitting: Physician Assistant

## 2023-09-17 ENCOUNTER — Ambulatory Visit (INDEPENDENT_AMBULATORY_CARE_PROVIDER_SITE_OTHER): Admitting: Physician Assistant

## 2023-09-17 VITALS — BP 120/70 | HR 77 | Temp 97.9°F | Ht 64.0 in | Wt 181.4 lb

## 2023-09-17 DIAGNOSIS — L03113 Cellulitis of right upper limb: Secondary | ICD-10-CM | POA: Diagnosis not present

## 2023-09-17 DIAGNOSIS — E1165 Type 2 diabetes mellitus with hyperglycemia: Secondary | ICD-10-CM

## 2023-09-17 DIAGNOSIS — Z7984 Long term (current) use of oral hypoglycemic drugs: Secondary | ICD-10-CM | POA: Diagnosis not present

## 2023-09-17 DIAGNOSIS — I10 Essential (primary) hypertension: Secondary | ICD-10-CM

## 2023-09-17 LAB — COMPREHENSIVE METABOLIC PANEL WITH GFR
ALT: 13 U/L (ref 0–35)
AST: 13 U/L (ref 0–37)
Albumin: 4.3 g/dL (ref 3.5–5.2)
Alkaline Phosphatase: 42 U/L (ref 39–117)
BUN: 26 mg/dL — ABNORMAL HIGH (ref 6–23)
CO2: 29 meq/L (ref 19–32)
Calcium: 9.7 mg/dL (ref 8.4–10.5)
Chloride: 99 meq/L (ref 96–112)
Creatinine, Ser: 0.71 mg/dL (ref 0.40–1.20)
GFR: 83.4 mL/min (ref >60.00)
Glucose, Bld: 231 mg/dL — ABNORMAL HIGH (ref 70–99)
Potassium: 3.9 meq/L (ref 3.5–5.1)
Sodium: 136 meq/L (ref 135–145)
Total Bilirubin: 0.5 mg/dL (ref 0.2–1.2)
Total Protein: 7.4 g/dL (ref 6.0–8.3)

## 2023-09-17 LAB — LIPID PANEL
Cholesterol: 190 mg/dL (ref 0–200)
HDL: 54.9 mg/dL (ref >39.00)
LDL Cholesterol: 98 mg/dL (ref 0–99)
NonHDL: 135.34
Total CHOL/HDL Ratio: 3
Triglycerides: 185 mg/dL — ABNORMAL HIGH (ref 0.0–149.0)
VLDL: 37 mg/dL (ref 0.0–40.0)

## 2023-09-17 LAB — CBC WITH DIFFERENTIAL/PLATELET
Basophils Absolute: 0 10*3/uL (ref 0.0–0.1)
Basophils Relative: 0.4 % (ref 0.0–3.0)
Eosinophils Absolute: 0.1 10*3/uL (ref 0.0–0.7)
Eosinophils Relative: 1.5 % (ref 0.0–5.0)
HCT: 43.6 % (ref 36.0–46.0)
Hemoglobin: 14.1 g/dL (ref 12.0–15.0)
Lymphocytes Relative: 19.2 % (ref 12.0–46.0)
Lymphs Abs: 1.3 10*3/uL (ref 0.7–4.0)
MCHC: 32.3 g/dL (ref 30.0–36.0)
MCV: 87.6 fl (ref 78.0–100.0)
Monocytes Absolute: 0.6 10*3/uL (ref 0.1–1.0)
Monocytes Relative: 8.8 % (ref 3.0–12.0)
Neutro Abs: 4.8 10*3/uL (ref 1.4–7.7)
Neutrophils Relative %: 70.1 % (ref 43.0–77.0)
Platelets: 195 10*3/uL (ref 150.0–400.0)
RBC: 4.98 Mil/uL (ref 3.87–5.11)
RDW: 13.7 % (ref 11.5–15.5)
WBC: 6.9 10*3/uL (ref 4.0–10.5)

## 2023-09-17 LAB — HEMOGLOBIN A1C: Hgb A1c MFr Bld: 10.6 % — ABNORMAL HIGH (ref 4.6–6.5)

## 2023-09-17 MED ORDER — CEPHALEXIN 500 MG PO CAPS: 500.0000 mg | ORAL_CAPSULE | Freq: Four times a day (QID) | ORAL | 0 refills | Status: DC

## 2023-09-17 NOTE — Progress Notes (Signed)
 Amanda Rollins is a 73 y.o. female here for a recurrence of a previously resolved problem.  History of Present Illness:   Chief Complaint  Patient presents with   Cellulitis    Pt c/o right arm and hand red and swollen, painful, started on Monday.    HPI  Cellulitis in right arm Pt complains of redness, swelling, and pain in her right arm and hand starting Monday 5/26.  She states that she had a headache all day and did not feel well on Monday. However, she started feeling better yesterday. Per pt, these episodes occur annually. Endorses regularly wearing lymphedema sleeve. Denies fever, chills, nausea.  Diabetes She is currently taking Jardiance  25 mg daily She cannot tolerate metformin  due to GI issues and is reluctant to do injectable GLP-1's She could not afford Rybelsus  and this was prescribed  Hypertension Currently taking losartan  hydrochlorothiazide 100-12.5 mg daily and Toprol  XL 25 mg daily Blood pressure is well-controlled   Past Medical History:  Diagnosis Date   Breast cancer (HCC) 2011   Diabetes mellitus type 2, controlled (HCC)    Heart murmur    Hyperlipidemia    Hypertension    Hypothyroidism      Social History   Tobacco Use   Smoking status: Former    Types: Cigarettes    Start date: 02/07/1970   Smokeless tobacco: Never  Substance Use Topics   Alcohol use: Not Currently    Alcohol/week: 3.0 standard drinks of alcohol    Types: 3 Glasses of wine per week   Drug use: Never    Past Surgical History:  Procedure Laterality Date   ABDOMINAL HYSTERECTOMY  2000   Still has ovaries   BELPHAROPTOSIS REPAIR  07/2019   BREAST SURGERY     CATARACT EXTRACTION  2019   Left eye 2019, Right eye 2020   CESAREAN SECTION  12/08/1990   MASTECTOMY  2011   Bilateral   ROBOTIC ASSISTED LAPAROSCOPIC VAGINAL HYSTERECTOMY WITH FIBROID REMOVAL  1991    Family History  Adopted: Yes  Problem Relation Age of Onset   Cancer Mother        small cell  cancer of liver and lungs    Allergies  Allergen Reactions   Sulfa Antibiotics Rash   Metformin  Diarrhea    Current Medications:   Current Outpatient Medications:    alendronate  (FOSAMAX ) 70 MG tablet, TAKE 1 TABLET(70 MG) BY MOUTH 1 TIME A WEEK WITH A FULL GLASS OF WATER AND ON AN EMPTY STOMACH, Disp: 12 tablet, Rfl: 3   calcium -vitamin D  (OSCAL WITH D) 250-125 MG-UNIT tablet, Take 1 tablet by mouth daily., Disp: , Rfl:    cephALEXin  (KEFLEX ) 500 MG capsule, Take 1 capsule (500 mg total) by mouth 4 (four) times daily., Disp: 40 capsule, Rfl: 0   gabapentin  (NEURONTIN ) 300 MG capsule, Take 1 capsule (300 mg total) by mouth at bedtime., Disp: 90 capsule, Rfl: 1   JARDIANCE  25 MG TABS tablet, TAKE 1 TABLET(25 MG) BY MOUTH DAILY, Disp: 90 tablet, Rfl: 1   levothyroxine  (SYNTHROID ) 50 MCG tablet, TAKE 1 TABLET(50 MCG) BY MOUTH DAILY, Disp: 90 tablet, Rfl: 1   losartan -hydrochlorothiazide (HYZAAR) 100-12.5 MG tablet, TAKE 1 TABLET BY MOUTH DAILY, Disp: 90 tablet, Rfl: 1   magnesium gluconate (MAGONATE) 500 MG tablet, Take 500 mg by mouth 2 (two) times daily., Disp: , Rfl:    metoprolol  succinate (TOPROL -XL) 25 MG 24 hr tablet, TAKE 1 TABLET(25 MG) BY MOUTH DAILY, Disp: 90 tablet, Rfl:  1   omeprazole (PRILOSEC) 10 MG capsule, Take 10 mg by mouth daily., Disp: , Rfl:    rosuvastatin  (CRESTOR ) 5 MG tablet, TAKE 1 TABLET(5 MG) BY MOUTH DAILY, Disp: 90 tablet, Rfl: 1   traZODone  (DESYREL ) 50 MG tablet, TAKE 1/2 TO 1 TABLET(25 TO 50 MG) BY MOUTH AT BEDTIME AS NEEDED FOR SLEEP, Disp: 30 tablet, Rfl: 3   Review of Systems:   Negative unless otherwise specified per HPI.  Vitals:   Vitals:   09/17/23 0927  BP: 120/70  Pulse: 77  Temp: 97.9 F (36.6 C)  TempSrc: Temporal  SpO2: 96%  Weight: 181 lb 6.1 oz (82.3 kg)  Height: 5\' 4"  (1.626 m)     Body mass index is 31.13 kg/m.  Physical Exam:   Physical Exam Vitals and nursing note reviewed.  Constitutional:      General: She is not in  acute distress.    Appearance: She is well-developed. She is not ill-appearing or toxic-appearing.  Cardiovascular:     Rate and Rhythm: Normal rate and regular rhythm.     Pulses: Normal pulses.     Heart sounds: Normal heart sounds, S1 normal and S2 normal.  Pulmonary:     Effort: Pulmonary effort is normal.     Breath sounds: Normal breath sounds.  Skin:    General: Skin is warm and dry.     Comments: Right upper extremity with generalized erythema and slight warmth, no induration or fluctuance appreciated  Neurological:     Mental Status: She is alert.     GCS: GCS eye subscore is 4. GCS verbal subscore is 5. GCS motor subscore is 6.  Psychiatric:        Speech: Speech normal.        Behavior: Behavior normal. Behavior is cooperative.     Assessment and Plan:   1. Type 2 diabetes mellitus with hyperglycemia, without long-term current use of insulin (HCC) Update A1c and provide recommendations accordingly I would like for her to trial a injectable GLP-1 if this is covered by her insurance however she is reluctant to doing a self injection I discussed that there are limited options if her A1c is not better and we may need to consider insulin soon if A1c remains elevated Follow-up based on results  - CBC with Differential/Platelet - Comprehensive metabolic panel with GFR - Lipid panel - Hemoglobin A1c  2. Cellulitis of right upper extremity No red flags or systemic illness obvious on exam today We will start cephalexin  500 mg 4 times daily, she has generally tolerated this well in the past and has responded to this If any worsening symptoms, recommend close follow-up  3. Primary hypertension Normotensive today Continue losartan  hydrochlorothiazide 100-12.5 mg daily and Toprol  XL 25 mg daily   Follow-up in 3-6 months, based on A1c results  I, Timoteo Force, acting as a Neurosurgeon for [employer], [place]., have documented all relevant documentation on the behalf of Alexander Iba, [place], as directed by  Alexander Iba, PA while in the presence of Alexander Iba, [place].  I, Alexander Iba, [place], have reviewed all documentation for this visit. The documentation on 09/17/23 for the exam, diagnosis, procedures, and orders are all accurate and complete.  Alexander Iba, PA-C

## 2023-09-18 ENCOUNTER — Ambulatory Visit: Payer: Self-pay | Admitting: Physician Assistant

## 2023-09-18 ENCOUNTER — Ambulatory Visit: Admitting: Physical Therapy

## 2023-09-18 ENCOUNTER — Other Ambulatory Visit: Payer: Self-pay | Admitting: Physician Assistant

## 2023-09-18 ENCOUNTER — Encounter: Payer: Self-pay | Admitting: Physical Therapy

## 2023-09-18 DIAGNOSIS — M25512 Pain in left shoulder: Secondary | ICD-10-CM

## 2023-09-18 DIAGNOSIS — E1165 Type 2 diabetes mellitus with hyperglycemia: Secondary | ICD-10-CM

## 2023-09-18 DIAGNOSIS — G8929 Other chronic pain: Secondary | ICD-10-CM | POA: Diagnosis not present

## 2023-09-18 DIAGNOSIS — M25511 Pain in right shoulder: Secondary | ICD-10-CM

## 2023-09-18 DIAGNOSIS — M6281 Muscle weakness (generalized): Secondary | ICD-10-CM

## 2023-09-18 DIAGNOSIS — J383 Other diseases of vocal cords: Secondary | ICD-10-CM

## 2023-09-18 MED ORDER — METFORMIN HCL ER 500 MG PO TB24: 500.0000 mg | ORAL_TABLET | Freq: Every day | ORAL | 1 refills | Status: DC

## 2023-09-18 NOTE — Therapy (Signed)
 OUTPATIENT PHYSICAL THERAPY CERVICAL TREATMENT   Patient Name: Amanda Rollins MRN: [id number] DOB:24-Mar-1951, 74 y.o., female Today's Date: 09/18/2023  END OF SESSION:  PT End of Session - 09/18/23 1300     Visit Number 2    Number of Visits 16    Date for PT Re-Evaluation 11/17/23    Authorization Type medicare BCBS no auth    Progress Note Due on Visit 10    PT Start Time 1304    PT Stop Time 1344    PT Time Calculation (min) 40 min    Activity Tolerance Patient limited by pain    Behavior During Therapy [facility] for tasks assessed/performed             Past Medical History:  Diagnosis Date   Breast cancer (HCC) 2011   Diabetes mellitus type 2, controlled (HCC)    Heart murmur    Hyperlipidemia    Hypertension    Hypothyroidism    Past Surgical History:  Procedure Laterality Date   ABDOMINAL HYSTERECTOMY  2000   Still has ovaries   BELPHAROPTOSIS REPAIR  07/2019   BREAST SURGERY     CATARACT EXTRACTION  2019   Left eye 2019, Right eye 2020   CESAREAN SECTION  12/08/1990   MASTECTOMY  2011   Bilateral   ROBOTIC ASSISTED LAPAROSCOPIC VAGINAL HYSTERECTOMY WITH FIBROID REMOVAL  1991   Patient Active Problem List   Diagnosis Date Noted   History of breast cancer 04/10/2022   Osteoporosis 04/10/2022   Depression, major, single episode, mild (HCC) 04/05/2020   Lymphedema 02/08/2020   Pulmonary nodules 02/08/2020   Atherosclerosis of abdominal aorta (HCC) 02/08/2020   Hypothyroidism    Hypertension    Hyperlipidemia    Heart murmur    Diabetes mellitus type 2, controlled (HCC)    Breast cancer (HCC) 2011    PCP: Alexander Iba, PA   REFERRING PROVIDER: Alexander Iba, PA   REFERRING DIAG: M25.511,G89.29,M25.512 (ICD-10-CM) - Chronic pain of both shoulders  THERAPY DIAG:  Chronic pain of both shoulders  Muscle weakness (generalized)  Rationale for Evaluation and Treatment: Rehabilitation  ONSET DATE: > 7 months ago  SUBJECTIVE:                                                                                                                                                                                                          SUBJECTIVE STATEMENT: 09/18/2023 States that she got cellulitis in her right arm on Monday. States she is on antibiotics. She was unable to get hre compression sleeve.  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 Reports during that time she fell on her right shoulder and broker her right humerus. She has base line lymphedema in her R UE that she manages but her compression sleeve is > 10 year old and she doesn't use her pump regularly due to the time restraints.  She has has 2 grand children that are 18.60 years old and 18 months old and she lifts them a lot. It hurts when she sleeps on her side.  She also notes occasional pelvic pain and incontinence.     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   Hand dominance: Right  PERTINENT HISTORY:  DB, HTN, cesarean section, B mastectomy, lymphedema history, breast cancer in remission, abdominal hernia, back pain, osteoporosis   PAIN:  Are you having pain? Yes: NPRS scale: current pain is 2/10 Pain location: B lateral shoulders and in her  joints Pain description: achy Aggravating factors: reaching a certain way, sleeping on her sides,  Relieving factors: tylenol  PRECAUTIONS: None  RED FLAGS: None     WEIGHT BEARING RESTRICTIONS: No  FALLS:  Has patient fallen in last 6 months? no     OCCUPATION: house sits dogs  PLOF: Independent  PATIENT GOALS: to have less pain    OBJECTIVE:  Note: Objective measures were completed at Evaluation unless otherwise noted.  DIAGNOSTIC FINDINGS:  No recent  imaging   PATIENT SURVEYS:  Patient-specific activity functional scoring scheme (Point to one number):  "0" represents "unable to perform." "10" represents "able to perform at prior level. 0 1 2 3 4 5 6 7 8 9  10 (Date and Score) Activity Initial  Activity Eval     Reaching up (putting things away)  3    Washing back (reaching behind) 3     Sleeping on her side 3    Additional Additional Total score = sum of the activity scores/number of activities Minimum detectable change (90%CI) for average score = 2 points Minimum detectable change (90%CI) for single activity score = 3 points PSFS developed by: Melbourne Spitz., & Binkley, J. (1995). Assessing disability and change on individual  patients: a report of a patient specific measure. Physiotherapy [place], 47, [phone]. Reproduced with the permission of the authors  Score: 3   COGNITION: Overall cognitive status: Within functional limits for tasks assessed  SENSATION: Not tested  POSTURE/observation: rounded shoulders, forward head, posterior pelvic tilt, and flexed trunk , R UE swelling throughout- stretched out compression sleeve in place.  PALPATION: Increased tenderness to palpation along right shoulder girdle, shoulder and right trunk    CERVICAL ROM:   Active ROM A/PROM (deg) eval  Flexion WFL  Extension 20*  Right lateral flexion   Left lateral flexion   Right rotation 60*  Left rotation 30*   (Blank rows = not tested) *tight/pulling    UE Measurements Upper Extremity Right EVAL Left EVAL   A/PROM MMT A/PROM MMT  Shoulder Flexion 80* NT* 140* 3+*  Shoulder Extension      Shoulder Abduction 80* NT* WFL^ 3+*  Shoulder Adduction      Shoulder Internal Rotation      Shoulder External Rotation  Elbow Flexion      Elbow Extension      Wrist Flexion      Wrist Extension      Wrist Supination      Wrist Pronation      Wrist Ulnar Deviation      Wrist Radial Deviation      Grip  Strength NA  NA     (Blank rows = not tested)   * pain ^ tight     TREATMENT DATE:                                                                                                                               09/18/2023  Therapeutic Exercise:  Aerobic: Supine: Prone:  Seated: self massage with tennis ball to left chest, shoulder ER 2 minutes , scap retraction reviewed x20 5" holds, TABLE SLIDES X30 B    Standing:   Neuromuscular Re-education: KT tape to right shoulder- comfort noted with application and educated patient on how and why to remove tape Manual Therapy: STM and IASTM with percussion gun to right shoulder and scapular region - tolerated well - used towel for padding- did not tolerate percussion gun on this date Therapeutic Activity: Self Care: on compression garment information  Trigger Point Dry Needling:  Modalities:     PATIENT EDUCATION:  Education details: on HEP, compression garment information  Person educated: Patient Education method: Explanation, Demonstration, and Handouts Education comprehension: verbalized understanding   HOME EXERCISE PROGRAM: [id number]  ASSESSMENT:  CLINICAL IMPRESSION: 09/18/2023  Session focused on manual interventions, tolerated moderately well. Increased tone noted throughout right shoulder girdle. Educated patient in self massage with tennis ball which was tolerated well. Changed home exercises secondary to previous exercise causing pain. Applied KT tape to right shoulder for support which was tolerated well. Educated patient in how and when to remove if needed. Will continue with current POC as tolerated.   EVAL:Patient presents to PT with complaints of chronic  B shoulder pain that has worsening over time. Complex medical history of B mastectomy, right lymphedema, and history of chemo/radiation to R UE contributing to current presentation. Patient with increased swelling in right UE, ROM and strength limitations bilaterally with  R>L and postural limitation that are likely contributing to current presentation. Patient would greatly benefit from skilled PT to address physical impairments and return her to optimal function.   OBJECTIVE IMPAIRMENTS: decreased activity tolerance, decreased ROM, decreased strength, increased edema, impaired UE functional use, improper body mechanics, postural dysfunction, and pain.   ACTIVITY LIMITATIONS: carrying, lifting, sleeping, bathing, dressing, reach over head, and hygiene/grooming  PARTICIPATION LIMITATIONS: meal prep and cleaning  PERSONAL FACTORS: Fitness, Time since onset of injury/illness/exacerbation, and 1-2 comorbidities: B mastectomy, right UE lymphedema are also affecting patient's functional outcome.   REHAB POTENTIAL: Good  CLINICAL DECISION MAKING: Evolving/moderate complexity  EVALUATION COMPLEXITY: Moderate   GOALS: Goals reviewed with patient? yes  SHORT TERM GOALS: Target date: 10/06/2023  Patient will be independent in self management strategies to improve quality of life and functional outcomes. Baseline: New Program Goal status: INITIAL  2.  Patient will report at least 50% improvement in overall symptoms and/or function to demonstrate improved functional mobility Baseline: 0% better Goal status: INITIAL  3.  Patient will report using her pump at least 3x a week to help manage the fluid in her R UE  Baseline: not currently Goal status: INITIAL  4.  Patient will report sitting in good posture intermittently throughout the day to reduce stress on neck and shoulder Baseline: not currently Goal status: INITIAL    LONG TERM GOALS: Target date: 11/17/2023    Patient will report at least 75% improvement in overall symptoms and/or function to demonstrate improved functional mobility Baseline: 0% better Goal status: INITIAL  2.  Patient will score at least 2 points higher on PSFS average to demonstrate change in overall function. Baseline: see  above Goal status: INITIAL  3.  Patent will demonstrate at least 90 degrees of right shoulder scaption to improve shoulder mobility in R UE Baseline:  Goal status: INITIAL       PLAN:  PT FREQUENCY: 1-2x/week for a total of 16 visits over 12 week certification period   PT DURATION: 12 weeks  PLANNED INTERVENTIONS: 97110-Therapeutic exercises, 97530- Therapeutic activity, 97112- Neuromuscular re-education, 97535- Self Care, 04540- Manual therapy, [phone]- Gait training, [phone]- Orthotic Fit/training, [phone]- Canalith repositioning, [id number]- Aquatic Therapy, 97014- Electrical stimulation (unattended), [phone]- Ionotophoresis 4mg /ml Dexamethasone, Patient/Family education, Balance training, Stair training, Taping, Dry Needling, Joint mobilization, Joint manipulation, Spinal manipulation, Spinal mobilization, Cryotherapy, and Moist heat   PLAN FOR NEXT SESSION: shoulder ROM, manual/ STM, try KT tape to shoulder posture, breathing, core work, f/u with compression sleeve and pump   3:17 PM, 09/18/23 Tabitha Ewings, DPT Physical Therapy with [place]

## 2023-09-22 ENCOUNTER — Ambulatory Visit: Admitting: Physical Therapy

## 2023-09-22 ENCOUNTER — Encounter: Payer: Self-pay | Admitting: Physical Therapy

## 2023-09-22 DIAGNOSIS — G8929 Other chronic pain: Secondary | ICD-10-CM

## 2023-09-22 DIAGNOSIS — M6281 Muscle weakness (generalized): Secondary | ICD-10-CM

## 2023-09-22 DIAGNOSIS — M25512 Pain in left shoulder: Secondary | ICD-10-CM | POA: Diagnosis not present

## 2023-09-22 DIAGNOSIS — M25511 Pain in right shoulder: Secondary | ICD-10-CM

## 2023-09-22 NOTE — Therapy (Signed)
 OUTPATIENT PHYSICAL THERAPY CERVICAL TREATMENT   Patient Name: Amanda Rollins MRN: [id number] DOB:Nov 19, 1950, 73 y.o., female Today's Date: 09/22/2023  END OF SESSION:  PT End of Session - 09/22/23 1258     Visit Number 3    Number of Visits 16    Date for PT Re-Evaluation 11/17/23    Authorization Type medicare BCBS no auth    Progress Note Due on Visit 10    PT Start Time 1300    PT Stop Time 1340    PT Time Calculation (min) 40 min    Activity Tolerance Patient limited by pain    Behavior During Therapy [facility] for tasks assessed/performed             Past Medical History:  Diagnosis Date   Breast cancer (HCC) 2011   Diabetes mellitus type 2, controlled (HCC)    Heart murmur    Hyperlipidemia    Hypertension    Hypothyroidism    Past Surgical History:  Procedure Laterality Date   ABDOMINAL HYSTERECTOMY  2000   Still has ovaries   BELPHAROPTOSIS REPAIR  07/2019   BREAST SURGERY     CATARACT EXTRACTION  2019   Left eye 2019, Right eye 2020   CESAREAN SECTION  12/08/1990   MASTECTOMY  2011   Bilateral   ROBOTIC ASSISTED LAPAROSCOPIC VAGINAL HYSTERECTOMY WITH FIBROID REMOVAL  1991   Patient Active Problem List   Diagnosis Date Noted   History of breast cancer 04/10/2022   Osteoporosis 04/10/2022   Depression, major, single episode, mild (HCC) 04/05/2020   Lymphedema 02/08/2020   Pulmonary nodules 02/08/2020   Atherosclerosis of abdominal aorta (HCC) 02/08/2020   Hypothyroidism    Hypertension    Hyperlipidemia    Heart murmur    Diabetes mellitus type 2, controlled (HCC)    Breast cancer (HCC) 2011    PCP: Alexander Iba, PA   REFERRING PROVIDER: Alexander Iba, PA   REFERRING DIAG: M25.511,G89.29,M25.512 (ICD-10-CM) - Chronic pain of both shoulders  THERAPY DIAG:  Chronic pain of both shoulders  Muscle weakness (generalized)  Rationale for Evaluation and Treatment: Rehabilitation  ONSET DATE: > 7 months ago  SUBJECTIVE:                                                                                                                                                                                                          SUBJECTIVE STATEMENT: 09/22/2023 States that she felt good with the tape helped for about 24 hours but then it started aching again. Was sitting wrong and thinks  she pinched a nerve and feels like it is radiating up to the left side.   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 Reports during that time she fell on her right shoulder and broker her right humerus. She has base line lymphedema in her R UE that she manages but her compression sleeve is > 80 year old and she doesn't use her pump regularly due to the time restraints.  She has has 2 grand children that are 105.25 years old and 61 months old and she lifts them a lot. It hurts when she sleeps on her side.  She also notes occasional pelvic pain and incontinence.     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   Hand dominance: Right  PERTINENT HISTORY:  DB, HTN, cesarean section, B mastectomy, lymphedema history, breast cancer in remission, abdominal hernia, back pain, osteoporosis   PAIN:  Are you having pain? Yes: NPRS scale: current pain is 2/10 Pain location: B lateral shoulders and in her  joints Pain description: achy Aggravating factors: reaching a certain way, sleeping on her sides,  Relieving factors: tylenol  PRECAUTIONS: None  RED FLAGS: None     WEIGHT BEARING RESTRICTIONS: No  FALLS:  Has patient fallen in last 6 months? no     OCCUPATION: house sits dogs  PLOF: Independent  PATIENT GOALS: to have less pain    OBJECTIVE:  Note: Objective measures were completed at Evaluation  unless otherwise noted.  DIAGNOSTIC FINDINGS:  No recent imaging   PATIENT SURVEYS:  Patient-specific activity functional scoring scheme (Point to one number):  "0" represents "unable to perform." "10" represents "able to perform at prior level. 0 1 2 3 4 5 6 7 8 9  10 (Date and Score) Activity Initial  Activity Eval     Reaching up (putting things away)  3    Washing back (reaching behind) 3     Sleeping on her side 3    Additional Additional Total score = sum of the activity scores/number of activities Minimum detectable change (90%CI) for average score = 2 points Minimum detectable change (90%CI) for single activity score = 3 points PSFS developed by: [place] Spitz., & Binkley, J. (1995). Assessing disability and change on individual  patients: a report of a patient specific measure. Physiotherapy [place], 47, [phone]. Reproduced with the permission of the authors  Score: 3   COGNITION: Overall cognitive status: Within functional limits for tasks assessed  SENSATION: Not tested  POSTURE/observation: rounded shoulders, forward head, posterior pelvic tilt, and flexed trunk , R UE swelling throughout- stretched out compression sleeve in place.  PALPATION: Increased tenderness to palpation along right shoulder girdle, shoulder and right trunk    CERVICAL ROM:   Active ROM A/PROM (deg) eval  Flexion WFL  Extension 20*  Right lateral flexion   Left lateral flexion   Right rotation 60*  Left rotation 30*   (Blank rows = not tested) *tight/pulling    UE Measurements Upper Extremity Right EVAL Left EVAL   A/PROM MMT A/PROM MMT  Shoulder Flexion 80* NT* 140* 3+*  Shoulder Extension      Shoulder Abduction 80* NT* WFL^ 3+*  Shoulder Adduction  Shoulder Internal Rotation      Shoulder External Rotation      Elbow Flexion      Elbow Extension      Wrist Flexion      Wrist Extension      Wrist Supination      Wrist Pronation       Wrist Ulnar Deviation      Wrist Radial Deviation      Grip Strength NA  NA     (Blank rows = not tested)   * pain ^ tight     TREATMENT DATE:                                                                                                                               09/22/2023  Therapeutic Exercise:  Review of HEP Supine: Prone:  Seated: shoulder ER x3 rounds of 1 minute bouts, shoulder rolls and shrugs x 20 bilaterally    Standing:  shoulder extension arm on table x3 rounds of 1 minute bouts Neuromuscular Re-education:   Manual Therapy: STM and IASTM with wooden tool  to B  shoulder and scapular region - tolerated well  Therapeutic Activity: Self Care: Educated on how to remove KT tape safely and performed while in clinic 6 minutes Trigger Point Dry Needling:  Modalities: thermotherapy to cervical spine during manual interventions    PATIENT EDUCATION:  Education details: on HEP,  on benefits of heat/ice and when to use which as well as when to not use heat/ice Person educated: Patient Education method: Explanation, Demonstration, and Handouts Education comprehension: verbalized understanding   HOME EXERCISE PROGRAM: [id number]  ASSESSMENT:  CLINICAL IMPRESSION: 09/22/2023 Patient in safe removal of KT tape and then removed the tape safely in clinic.  No redness or irritation noted.  Continued with manual as this was tolerated well last time.  Significantly reduced symptoms noted end of session in bilateral shoulders.  Added additional exercises and range of motion to home exercise program.  Overall patient doing well with therapy and will continue benefit from skilled PT at this time.  EVAL:Patient presents to PT with complaints of chronic  B shoulder pain that has worsening over time. Complex medical history of B mastectomy, right lymphedema, and history of chemo/radiation to R UE contributing to current presentation. Patient with increased swelling in right UE, ROM and  strength limitations bilaterally with R>L and postural limitation that are likely contributing to current presentation. Patient would greatly benefit from skilled PT to address physical impairments and return her to optimal function.   OBJECTIVE IMPAIRMENTS: decreased activity tolerance, decreased ROM, decreased strength, increased edema, impaired UE functional use, improper body mechanics, postural dysfunction, and pain.   ACTIVITY LIMITATIONS: carrying, lifting, sleeping, bathing, dressing, reach over head, and hygiene/grooming  PARTICIPATION LIMITATIONS: meal prep and cleaning  PERSONAL FACTORS: Fitness, Time since onset of injury/illness/exacerbation, and 1-2 comorbidities: B mastectomy, right UE lymphedema are also affecting patient's functional outcome.  REHAB POTENTIAL: Good  CLINICAL DECISION MAKING: Evolving/moderate complexity  EVALUATION COMPLEXITY: Moderate   GOALS: Goals reviewed with patient? yes  SHORT TERM GOALS: Target date: 10/06/2023   Patient will be independent in self management strategies to improve quality of life and functional outcomes. Baseline: New Program Goal status: INITIAL  2.  Patient will report at least 50% improvement in overall symptoms and/or function to demonstrate improved functional mobility Baseline: 0% better Goal status: INITIAL  3.  Patient will report using her pump at least 3x a week to help manage the fluid in her R UE  Baseline: not currently Goal status: INITIAL  4.  Patient will report sitting in good posture intermittently throughout the day to reduce stress on neck and shoulder Baseline: not currently Goal status: INITIAL    LONG TERM GOALS: Target date: 11/17/2023    Patient will report at least 75% improvement in overall symptoms and/or function to demonstrate improved functional mobility Baseline: 0% better Goal status: INITIAL  2.  Patient will score at least 2 points higher on PSFS average to demonstrate change in  overall function. Baseline: see above Goal status: INITIAL  3.  Patent will demonstrate at least 90 degrees of right shoulder scaption to improve shoulder mobility in R UE Baseline:  Goal status: INITIAL       PLAN:  PT FREQUENCY: 1-2x/week for a total of 16 visits over 12 week certification period   PT DURATION: 12 weeks  PLANNED INTERVENTIONS: 97110-Therapeutic exercises, 97530- Therapeutic activity, 97112- Neuromuscular re-education, 97535- Self Care, 82956- Manual therapy, [phone]- Gait training, [phone]- Orthotic Fit/training, [phone]- Canalith repositioning, [id number]- Aquatic Therapy, 97014- Electrical stimulation (unattended), [phone]- Ionotophoresis 4mg /ml Dexamethasone, Patient/Family education, Balance training, Stair training, Taping, Dry Needling, Joint mobilization, Joint manipulation, Spinal manipulation, Spinal mobilization, Cryotherapy, and Moist heat   PLAN FOR NEXT SESSION: f/u with lymphedema massage contact information shoulder ROM, manual/ STM, try KT tape to shoulder posture, breathing, core work, f/u with compression sleeve and pump   2:58 PM, 09/22/23 Tabitha Ewings, DPT Physical Therapy with [place]

## 2023-09-24 ENCOUNTER — Encounter (INDEPENDENT_AMBULATORY_CARE_PROVIDER_SITE_OTHER): Payer: Self-pay | Admitting: Otolaryngology

## 2023-09-29 ENCOUNTER — Ambulatory Visit: Admitting: Physical Therapy

## 2023-09-29 ENCOUNTER — Encounter: Payer: Self-pay | Admitting: Physical Therapy

## 2023-09-29 ENCOUNTER — Ambulatory Visit: Admitting: Dietician

## 2023-09-29 DIAGNOSIS — K080 Exfoliation of teeth due to systemic causes: Secondary | ICD-10-CM | POA: Diagnosis not present

## 2023-09-29 DIAGNOSIS — M25511 Pain in right shoulder: Secondary | ICD-10-CM | POA: Diagnosis not present

## 2023-09-29 DIAGNOSIS — G8929 Other chronic pain: Secondary | ICD-10-CM | POA: Diagnosis not present

## 2023-09-29 DIAGNOSIS — M6281 Muscle weakness (generalized): Secondary | ICD-10-CM

## 2023-09-29 DIAGNOSIS — M25512 Pain in left shoulder: Secondary | ICD-10-CM

## 2023-09-29 NOTE — Therapy (Signed)
 OUTPATIENT PHYSICAL THERAPY CERVICAL TREATMENT   Patient Name: Amanda Rollins MRN: [id number] DOB:10-18-1950, 73 y.o., female Today's Date: 09/29/2023  END OF SESSION:  PT End of Session - 09/29/23 1300     Visit Number 4    Number of Visits 16    Date for PT Re-Evaluation 11/17/23    Authorization Type medicare BCBS no auth    Progress Note Due on Visit 10    PT Start Time 1302    PT Stop Time 1340    PT Time Calculation (min) 38 min    Activity Tolerance Patient limited by pain    Behavior During Therapy [facility] for tasks assessed/performed             Past Medical History:  Diagnosis Date   Breast cancer (HCC) 2011   Diabetes mellitus type 2, controlled (HCC)    Heart murmur    Hyperlipidemia    Hypertension    Hypothyroidism    Past Surgical History:  Procedure Laterality Date   ABDOMINAL HYSTERECTOMY  2000   Still has ovaries   BELPHAROPTOSIS REPAIR  07/2019   BREAST SURGERY     CATARACT EXTRACTION  2019   Left eye 2019, Right eye 2020   CESAREAN SECTION  12/08/1990   MASTECTOMY  2011   Bilateral   ROBOTIC ASSISTED LAPAROSCOPIC VAGINAL HYSTERECTOMY WITH FIBROID REMOVAL  1991   Patient Active Problem List   Diagnosis Date Noted   History of breast cancer 04/10/2022   Osteoporosis 04/10/2022   Depression, major, single episode, mild (HCC) 04/05/2020   Lymphedema 02/08/2020   Pulmonary nodules 02/08/2020   Atherosclerosis of abdominal aorta (HCC) 02/08/2020   Hypothyroidism    Hypertension    Hyperlipidemia    Heart murmur    Diabetes mellitus type 2, controlled (HCC)    Breast cancer (HCC) 2011    PCP: Alexander Iba, PA   REFERRING PROVIDER: Alexander Iba, PA   REFERRING DIAG: M25.511,G89.29,M25.512 (ICD-10-CM) - Chronic pain of both shoulders  THERAPY DIAG:  Chronic pain of both shoulders  Muscle weakness (generalized)  Rationale for Evaluation and Treatment: Rehabilitation  ONSET DATE: > 7 months ago  SUBJECTIVE:                                                                                                                                                                                                          SUBJECTIVE STATEMENT: 09/29/2023 States that her right shoulder slightly irritated, exercises are bothering her.  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 Reports during that time she fell on her right shoulder and broker her right humerus. She has base line lymphedema in her R UE that she manages but her compression sleeve is > 38 year old and she doesn't use her pump regularly due to the time restraints.  She has has 2 grand children that are 19.68 years old and 60 months old and she lifts them a lot. It hurts when she sleeps on her side.  She also notes occasional pelvic pain and incontinence.     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   Hand dominance: Right  PERTINENT HISTORY:  DB, HTN, cesarean section, B mastectomy, lymphedema history, breast cancer in remission, abdominal hernia, back pain, osteoporosis   PAIN:  Are you having pain? Yes: NPRS scale: current pain is 5/10 Pain location: B lateral shoulders and in her  joints Pain description: achy Aggravating factors: reaching a certain way, sleeping on her sides,  Relieving factors: tylenol  PRECAUTIONS: None  RED FLAGS: None     WEIGHT BEARING RESTRICTIONS: No  FALLS:  Has patient fallen in last 6 months? no     OCCUPATION: house sits dogs  PLOF: Independent  PATIENT GOALS: to have less pain    OBJECTIVE:  Note: Objective measures were completed at Evaluation unless otherwise noted.  DIAGNOSTIC FINDINGS:  No recent imaging   PATIENT SURVEYS:  Patient-specific activity  functional scoring scheme (Point to one number):  "0" represents "unable to perform." "10" represents "able to perform at prior level. 0 1 2 3 4 5 6 7 8 9  10 (Date and Score) Activity Initial  Activity Eval     Reaching up (putting things away)  3    Washing back (reaching behind) 3     Sleeping on her side 3    Additional Additional Total score = sum of the activity scores/number of activities Minimum detectable change (90%CI) for average score = 2 points Minimum detectable change (90%CI) for single activity score = 3 points PSFS developed by: Melbourne Spitz., & Binkley, J. (1995). Assessing disability and change on individual  patients: a report of a patient specific measure. Physiotherapy [place], 47, [phone]. Reproduced with the permission of the authors  Score: 3   COGNITION: Overall cognitive status: Within functional limits for tasks assessed  SENSATION: Not tested  POSTURE/observation: rounded shoulders, forward head, posterior pelvic tilt, and flexed trunk , R UE swelling throughout- stretched out compression sleeve in place.  PALPATION: Increased tenderness to palpation along right shoulder girdle, shoulder and right trunk    CERVICAL ROM:   Active ROM A/PROM (deg) eval  Flexion WFL  Extension 20*  Right lateral flexion   Left lateral flexion   Right rotation 60*  Left rotation 30*   (Blank rows = not tested) *tight/pulling    UE Measurements Upper Extremity Right EVAL Left EVAL   A/PROM MMT A/PROM MMT  Shoulder Flexion 80* NT* 140* 3+*  Shoulder Extension      Shoulder Abduction 80* NT* WFL^ 3+*  Shoulder Adduction      Shoulder Internal Rotation      Shoulder External Rotation      Elbow Flexion      Elbow Extension  Wrist Flexion      Wrist Extension      Wrist Supination      Wrist Pronation      Wrist Ulnar Deviation      Wrist Radial Deviation      Grip Strength NA  NA     (Blank rows = not tested)   *  pain ^ tight     TREATMENT DATE:                                                                                                                               09/29/2023  Therapeutic Exercise:  Review of HEP and discussed possible benefits of thoracic brace- trailed with towel for stretch - tolerated well  Supine: Prone:  Seated: scapular retraction 1 minute total 5" holds, shoulder pulleys flexion and scaption 2 minutes each B    Standing:  wall pushups 5x5 Neuromuscular Re-education:   Manual Therapy: STM  and ISTM with tennis ball  to B  shoulder and scapular region - tolerated well  Therapeutic Activity: Self Care:   Trigger Point Dry Needling:  Modalities:      PATIENT EDUCATION:  Education details: on HEP,  massage and brace benefits Person educated: Patient Education method: Explanation, Demonstration, and Handouts Education comprehension: verbalized understanding   HOME EXERCISE PROGRAM: [id number]  ASSESSMENT:  CLINICAL IMPRESSION: 09/29/2023 Focused on manual work.  Tried different exercises which caused slight increase in pain. Discussed use of thoracic brace for pain management strategies. Tolerated massage best, reduced tension noted end of session. Will continue with current POC as tolerated.   EVAL:Patient presents to PT with complaints of chronic  B shoulder pain that has worsening over time. Complex medical history of B mastectomy, right lymphedema, and history of chemo/radiation to R UE contributing to current presentation. Patient with increased swelling in right UE, ROM and strength limitations bilaterally with R>L and postural limitation that are likely contributing to current presentation. Patient would greatly benefit from skilled PT to address physical impairments and return her to optimal function.   OBJECTIVE IMPAIRMENTS: decreased activity tolerance, decreased ROM, decreased strength, increased edema, impaired UE functional use, improper body mechanics,  postural dysfunction, and pain.   ACTIVITY LIMITATIONS: carrying, lifting, sleeping, bathing, dressing, reach over head, and hygiene/grooming  PARTICIPATION LIMITATIONS: meal prep and cleaning  PERSONAL FACTORS: Fitness, Time since onset of injury/illness/exacerbation, and 1-2 comorbidities: B mastectomy, right UE lymphedema are also affecting patient's functional outcome.   REHAB POTENTIAL: Good  CLINICAL DECISION MAKING: Evolving/moderate complexity  EVALUATION COMPLEXITY: Moderate   GOALS: Goals reviewed with patient? yes  SHORT TERM GOALS: Target date: 10/06/2023   Patient will be independent in self management strategies to improve quality of life and functional outcomes. Baseline: New Program Goal status: INITIAL  2.  Patient will report at least 50% improvement in overall symptoms and/or function to demonstrate improved functional mobility Baseline: 0% better Goal status: INITIAL  3.  Patient will  report using her pump at least 3x a week to help manage the fluid in her R UE  Baseline: not currently Goal status: INITIAL  4.  Patient will report sitting in good posture intermittently throughout the day to reduce stress on neck and shoulder Baseline: not currently Goal status: INITIAL    LONG TERM GOALS: Target date: 11/17/2023    Patient will report at least 75% improvement in overall symptoms and/or function to demonstrate improved functional mobility Baseline: 0% better Goal status: INITIAL  2.  Patient will score at least 2 points higher on PSFS average to demonstrate change in overall function. Baseline: see above Goal status: INITIAL  3.  Patent will demonstrate at least 90 degrees of right shoulder scaption to improve shoulder mobility in R UE Baseline:  Goal status: INITIAL       PLAN:  PT FREQUENCY: 1-2x/week for a total of 16 visits over 12 week certification period   PT DURATION: 12 weeks  PLANNED INTERVENTIONS: 97110-Therapeutic exercises,  97530- Therapeutic activity, 97112- Neuromuscular re-education, 97535- Self Care, 40981- Manual therapy, [phone]- Gait training, [phone]- Orthotic Fit/training, [phone]- Canalith repositioning, [id number]- Aquatic Therapy, 97014- Electrical stimulation (unattended), [phone]- Ionotophoresis 4mg /ml Dexamethasone, Patient/Family education, Balance training, Stair training, Taping, Dry Needling, Joint mobilization, Joint manipulation, Spinal manipulation, Spinal mobilization, Cryotherapy, and Moist heat   PLAN FOR NEXT SESSION: f/u with lymphedema massage contact information shoulder ROM, manual/ STM, try KT tape to shoulder posture, breathing, core work, f/u with compression sleeve and pump  F/u with thoracic brace    1:47 PM, 09/29/23 Tabitha Ewings, DPT Physical Therapy with [place]

## 2023-09-30 ENCOUNTER — Other Ambulatory Visit: Payer: Self-pay | Admitting: Physician Assistant

## 2023-10-06 ENCOUNTER — Ambulatory Visit: Admitting: Physical Therapy

## 2023-10-06 ENCOUNTER — Encounter: Payer: Self-pay | Admitting: Physical Therapy

## 2023-10-06 DIAGNOSIS — M25511 Pain in right shoulder: Secondary | ICD-10-CM

## 2023-10-06 DIAGNOSIS — M6281 Muscle weakness (generalized): Secondary | ICD-10-CM | POA: Diagnosis not present

## 2023-10-06 DIAGNOSIS — G8929 Other chronic pain: Secondary | ICD-10-CM | POA: Diagnosis not present

## 2023-10-06 DIAGNOSIS — M25512 Pain in left shoulder: Secondary | ICD-10-CM | POA: Diagnosis not present

## 2023-10-06 NOTE — Therapy (Signed)
 OUTPATIENT PHYSICAL THERAPY CERVICAL TREATMENT   Patient Name: Amanda Rollins MRN: [id number] DOB:Oct 23, 1950, 73 y.o., female Today's Date: 10/06/2023  END OF SESSION:  PT End of Session - 10/06/23 1300     Visit Number 5    Number of Visits 16    Date for PT Re-Evaluation 11/17/23    Authorization Type medicare BCBS no auth    Progress Note Due on Visit 10    PT Start Time 1301    PT Stop Time 1339    PT Time Calculation (min) 38 min    Activity Tolerance Patient limited by pain    Behavior During Therapy [facility] for tasks assessed/performed          Past Medical History:  Diagnosis Date   Breast cancer (HCC) 2011   Diabetes mellitus type 2, controlled (HCC)    Heart murmur    Hyperlipidemia    Hypertension    Hypothyroidism    Past Surgical History:  Procedure Laterality Date   ABDOMINAL HYSTERECTOMY  2000   Still has ovaries   BELPHAROPTOSIS REPAIR  07/2019   BREAST SURGERY     CATARACT EXTRACTION  2019   Left eye 2019, Right eye 2020   CESAREAN SECTION  12/08/1990   MASTECTOMY  2011   Bilateral   ROBOTIC ASSISTED LAPAROSCOPIC VAGINAL HYSTERECTOMY WITH FIBROID REMOVAL  1991   Patient Active Problem List   Diagnosis Date Noted   History of breast cancer 04/10/2022   Osteoporosis 04/10/2022   Depression, major, single episode, mild (HCC) 04/05/2020   Lymphedema 02/08/2020   Pulmonary nodules 02/08/2020   Atherosclerosis of abdominal aorta (HCC) 02/08/2020   Hypothyroidism    Hypertension    Hyperlipidemia    Heart murmur    Diabetes mellitus type 2, controlled (HCC)    Breast cancer (HCC) 2011    PCP: Alexander Iba, PA   REFERRING PROVIDER: Alexander Iba, PA   REFERRING DIAG: M25.511,G89.29,M25.512 (ICD-10-CM) - Chronic pain of both shoulders  THERAPY DIAG:  Chronic pain of both shoulders  Muscle weakness (generalized)  Rationale for Evaluation and Treatment: Rehabilitation  ONSET DATE: > 7 months ago  SUBJECTIVE:                                                                                                                                                                                                          SUBJECTIVE STATEMENT: 10/06/2023 States she was doing pretty well. States when she wakes up in the morning she tenses her shoulders. When that happens she moves around and it just  deals with it. States she is watching dogs so she is very busy.   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 Reports during that time she fell on her right shoulder and broker her right humerus. She has base line lymphedema in her R UE that she manages but her compression sleeve is > 46 year old and she doesn't use her pump regularly due to the time restraints.  She has has 2 grand children that are 68.53 years old and 13 months old and she lifts them a lot. It hurts when she sleeps on her side.  She also notes occasional pelvic pain and incontinence.     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   Hand dominance: Right  PERTINENT HISTORY:  DB, HTN, cesarean section, B mastectomy, lymphedema history, breast cancer in remission, abdominal hernia, back pain, osteoporosis   PAIN:  Are you having pain? Yes: NPRS scale: current pain is 5/10 Pain location: upper shoulders  Pain description: achy Aggravating factors: reaching a certain way, sleeping on her sides,  Relieving factors: tylenol  PRECAUTIONS: None  RED FLAGS: None     WEIGHT BEARING RESTRICTIONS: No  FALLS:  Has patient fallen in last 6 months? no     OCCUPATION: house sits dogs  PLOF: Independent  PATIENT GOALS: to have less pain    OBJECTIVE:  Note: Objective measures were completed at Evaluation unless otherwise  noted.  DIAGNOSTIC FINDINGS:  No recent imaging   PATIENT SURVEYS:  Patient-specific activity functional scoring scheme (Point to one number):  0 represents "unable to perform." 10 represents "able to perform at prior level. 0 1 2 3 4 5 6 7 8 9  10 (Date and Score) Activity Initial  Activity Eval     Reaching up (putting things away)  3    Washing back (reaching behind) 3     Sleeping on her side 3    Additional Additional Total score = sum of the activity scores/number of activities Minimum detectable change (90%CI) for average score = 2 points Minimum detectable change (90%CI) for single activity score = 3 points PSFS developed by: [place] Spitz., & Binkley, J. (1995). Assessing disability and change on individual  patients: a report of a patient specific measure. Physiotherapy [place], 47, [phone]. Reproduced with the permission of the authors  Score: 3   COGNITION: Overall cognitive status: Within functional limits for tasks assessed  SENSATION: Not tested  POSTURE/observation: rounded shoulders, forward head, posterior pelvic tilt, and flexed trunk , R UE swelling throughout- stretched out compression sleeve in place.  PALPATION: Increased tenderness to palpation along right shoulder girdle, shoulder and right trunk    CERVICAL ROM:   Active ROM A/PROM (deg) eval  Flexion WFL  Extension 20*  Right lateral flexion   Left lateral flexion   Right rotation 60*  Left rotation 30*   (Blank rows = not tested) *tight/pulling    UE Measurements Upper Extremity Right EVAL Left EVAL   A/PROM MMT A/PROM MMT  Shoulder Flexion 80* NT* 140* 3+*  Shoulder Extension      Shoulder Abduction 80* NT* WFL^ 3+*  Shoulder Adduction      Shoulder Internal Rotation  Shoulder External Rotation      Elbow Flexion      Elbow Extension      Wrist Flexion      Wrist Extension      Wrist Supination      Wrist Pronation      Wrist Ulnar  Deviation      Wrist Radial Deviation      Grip Strength NA  NA     (Blank rows = not tested)   * pain ^ tight     TREATMENT DATE:                                                                                                                               10/06/2023  Therapeutic Exercise:  Review of HEP  Supine: Prone:  Seated: scapular retraction 1 minute total 5 holds, shoulder rolls 2 minutes, shoulder ER YTB 3x5 B, bicep curls 3x5 B YTB one at a time, unilateral shoulder EXT 4x5 R.    Standing:    Neuromuscular Re-education:   Manual Therapy: STM  and IASTM with tennis ball  to B  shoulder and scapular region and Right UE- tolerated well  Therapeutic Activity: Self Care:   Trigger Point Dry Needling:  Modalities:      PATIENT EDUCATION:  Education details: on HEP  Person educated: Patient Education method: Explanation, Facilities manager, and Handouts Education comprehension: verbalized understanding   HOME EXERCISE PROGRAM: [id number]  ASSESSMENT:  CLINICAL IMPRESSION: 10/06/2023 Continued with pain management which was tolerated moderately well. Continued intermittent pain with certain exercises but this resolved with rest. No increase in pain noted end of session. Overall patient doing OK but pain limits ability to perform exercises. Will continue with current POC as tolerated  EVAL:Patient presents to PT with complaints of chronic  B shoulder pain that has worsening over time. Complex medical history of B mastectomy, right lymphedema, and history of chemo/radiation to R UE contributing to current presentation. Patient with increased swelling in right UE, ROM and strength limitations bilaterally with R>L and postural limitation that are likely contributing to current presentation. Patient would greatly benefit from skilled PT to address physical impairments and return her to optimal function.   OBJECTIVE IMPAIRMENTS: decreased activity tolerance, decreased ROM, decreased  strength, increased edema, impaired UE functional use, improper body mechanics, postural dysfunction, and pain.   ACTIVITY LIMITATIONS: carrying, lifting, sleeping, bathing, dressing, reach over head, and hygiene/grooming  PARTICIPATION LIMITATIONS: meal prep and cleaning  PERSONAL FACTORS: Fitness, Time since onset of injury/illness/exacerbation, and 1-2 comorbidities: B mastectomy, right UE lymphedema are also affecting patient's functional outcome.   REHAB POTENTIAL: Good  CLINICAL DECISION MAKING: Evolving/moderate complexity  EVALUATION COMPLEXITY: Moderate   GOALS: Goals reviewed with patient? yes  SHORT TERM GOALS: Target date: 10/06/2023   Patient will be independent in self management strategies to improve quality of life and functional outcomes. Baseline: New Program Goal status: INITIAL  2.  Patient will report at  least 50% improvement in overall symptoms and/or function to demonstrate improved functional mobility Baseline: 0% better Goal status: INITIAL  3.  Patient will report using her pump at least 3x a week to help manage the fluid in her R UE  Baseline: not currently Goal status: INITIAL  4.  Patient will report sitting in good posture intermittently throughout the day to reduce stress on neck and shoulder Baseline: not currently Goal status: INITIAL    LONG TERM GOALS: Target date: 11/17/2023    Patient will report at least 75% improvement in overall symptoms and/or function to demonstrate improved functional mobility Baseline: 0% better Goal status: INITIAL  2.  Patient will score at least 2 points higher on PSFS average to demonstrate change in overall function. Baseline: see above Goal status: INITIAL  3.  Patent will demonstrate at least 90 degrees of right shoulder scaption to improve shoulder mobility in R UE Baseline:  Goal status: INITIAL       PLAN:  PT FREQUENCY: 1-2x/week for a total of 16 visits over 12 week certification period    PT DURATION: 12 weeks  PLANNED INTERVENTIONS: 97110-Therapeutic exercises, 97530- Therapeutic activity, 97112- Neuromuscular re-education, 97535- Self Care, 32440- Manual therapy, [phone]- Gait training, [phone]- Orthotic Fit/training, [phone]- Canalith repositioning, [id number]- Aquatic Therapy, 97014- Electrical stimulation (unattended), [phone]- Ionotophoresis 4mg /ml Dexamethasone, Patient/Family education, Balance training, Stair training, Taping, Dry Needling, Joint mobilization, Joint manipulation, Spinal manipulation, Spinal mobilization, Cryotherapy, and Moist heat   PLAN FOR NEXT SESSION: f/u with lymphedema massage contact information shoulder ROM, manual/ STM, try KT tape to shoulder posture, breathing, core work, f/u with compression sleeve and pump  F/u with thoracic brace    1:41 PM, 10/06/23 Tabitha Ewings, DPT Physical Therapy with [place]

## 2023-10-07 DIAGNOSIS — K080 Exfoliation of teeth due to systemic causes: Secondary | ICD-10-CM | POA: Diagnosis not present

## 2023-10-13 ENCOUNTER — Ambulatory Visit: Admitting: Physical Therapy

## 2023-10-13 ENCOUNTER — Encounter: Payer: Self-pay | Admitting: Physical Therapy

## 2023-10-13 ENCOUNTER — Ambulatory Visit: Admitting: Dietician

## 2023-10-13 DIAGNOSIS — G8929 Other chronic pain: Secondary | ICD-10-CM | POA: Diagnosis not present

## 2023-10-13 DIAGNOSIS — M25512 Pain in left shoulder: Secondary | ICD-10-CM

## 2023-10-13 DIAGNOSIS — M25511 Pain in right shoulder: Secondary | ICD-10-CM | POA: Diagnosis not present

## 2023-10-13 DIAGNOSIS — M6281 Muscle weakness (generalized): Secondary | ICD-10-CM

## 2023-10-13 NOTE — Therapy (Signed)
 OUTPATIENT PHYSICAL THERAPY CERVICAL TREATMENT   Patient Name: Shakila Mak MRN: [id number] DOB:[birth year], 73 y.o., female Today's Date: 10/13/2023  END OF SESSION:  PT End of Session - 10/13/23 1257     Visit Number 6    Number of Visits 16    Date for PT Re-Evaluation 11/17/23    Authorization Type medicare BCBS no auth    Progress Note Due on Visit 10    PT Start Time 1300    PT Stop Time 1338    PT Time Calculation (min) 38 min    Activity Tolerance Patient limited by pain    Behavior During Therapy [facility] for tasks assessed/performed          Past Medical History:  Diagnosis Date   Breast cancer (HCC) 2011   Diabetes mellitus type 2, controlled (HCC)    Heart murmur    Hyperlipidemia    Hypertension    Hypothyroidism    Past Surgical History:  Procedure Laterality Date   ABDOMINAL HYSTERECTOMY  2000   Still has ovaries   BELPHAROPTOSIS REPAIR  07/2019   BREAST SURGERY     CATARACT EXTRACTION  2019   Left eye 2019, Right eye 2020   CESAREAN SECTION  12/08/1990   MASTECTOMY  2011   Bilateral   ROBOTIC ASSISTED LAPAROSCOPIC VAGINAL HYSTERECTOMY WITH FIBROID REMOVAL  1991   Patient Active Problem List   Diagnosis Date Noted   History of breast cancer 04/10/2022   Osteoporosis 04/10/2022   Depression, major, single episode, mild (HCC) 04/05/2020   Lymphedema 02/08/2020   Pulmonary nodules 02/08/2020   Atherosclerosis of abdominal aorta (HCC) 02/08/2020   Hypothyroidism    Hypertension    Hyperlipidemia    Heart murmur    Diabetes mellitus type 2, controlled (HCC)    Breast cancer (HCC) 2011    PCP: Job Lukes, PA   REFERRING PROVIDER: Job Lukes, PA   REFERRING DIAG: M25.511,G89.29,M25.512 (ICD-10-CM) - Chronic pain of both shoulders  THERAPY DIAG:  Chronic pain of both shoulders  Muscle weakness (generalized)  Rationale for Evaluation and Treatment: Rehabilitation  ONSET DATE: > 7 months ago  SUBJECTIVE:                                                                                                                                                                                                          SUBJECTIVE STATEMENT: 10/13/2023 States she thinks she might have lifted a bit much yesterday. States she did a massage Friday and that helped. Reports she feels about 70% better since  the start of PT. Has not been using her pump but is back home now and is going to try it at night.  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 Reports during that time she fell on her right shoulder and broker her right humerus. She has base line lymphedema in her R UE that she manages but her compression sleeve is > 22 year old and she doesn't use her pump regularly due to the time restraints.  She has has 2 grand children that are 9.22 years old and 58 months old and she lifts them a lot. It hurts when she sleeps on her side.  She also notes occasional pelvic pain and incontinence.     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   Hand dominance: Right  PERTINENT HISTORY:  DB, HTN, cesarean section, B mastectomy, lymphedema history, breast cancer in remission, abdominal hernia, back pain, osteoporosis   PAIN:  Are you having pain? Yes: NPRS scale: current pain is 5 at rest, 8 with movement/10 Pain location: upper shoulders  Pain description: achy Aggravating factors: reaching a certain way, sleeping on her sides,  Relieving factors: tylenol  PRECAUTIONS: None  RED FLAGS: None     WEIGHT BEARING RESTRICTIONS: No  FALLS:  Has patient fallen in last 6 months? no     OCCUPATION: house sits dogs  PLOF: Independent  PATIENT GOALS: to have less pain    OBJECTIVE:  Note:  Objective measures were completed at Evaluation unless otherwise noted.  DIAGNOSTIC FINDINGS:  No recent imaging   PATIENT SURVEYS:  Patient-specific activity functional scoring scheme (Point to one number):  0 represents "unable to perform." 10 represents "able to perform at prior level. 0 1 2 3 4 5 6 7 8 9  10 (Date and Score) Activity Initial  Activity Eval  6/23   Reaching up (putting things away)  3  5  Washing back (reaching behind) 3   1  Sleeping on her side 3 8   Additional Additional Total score = sum of the activity scores/number of activities Minimum detectable change (90%CI) for average score = 2 points Minimum detectable change (90%CI) for single activity score = 3 points PSFS developed by: Rosalee MYRTIS Marvis KYM Charlet CHRISTELLA., & Binkley, J. (1995). Assessing disability and change on individual  patients: a report of a patient specific measure. Physiotherapy [place], 47, [phone]. Reproduced with the permission of the authors  Score: 3= eval  6/23 14/3/4.66   COGNITION: Overall cognitive status: Within functional limits for tasks assessed  SENSATION: Not tested  POSTURE/observation: rounded shoulders, forward head, posterior pelvic tilt, and flexed trunk , R UE swelling throughout- stretched out compression sleeve in place.  PALPATION: Increased tenderness to palpation along right shoulder girdle, shoulder and right trunk    CERVICAL ROM:   Active ROM A/PROM (deg) 6/23  Flexion WFL**  Extension 20*  Right lateral flexion   Left lateral flexion   Right rotation 60  Left rotation 30**   (Blank rows = not tested) *tight/pulling **discomfort in neck     UE Measurements Upper Extremity Right 6/23 Left 6/23   A/PROM MMT A/PROM MMT  Shoulder Flexion 80^ NT* 145 4-  Shoulder Extension  Shoulder Abduction 80* NT* WFL* 3+*  Shoulder Adduction      Shoulder Internal Rotation      Shoulder External Rotation      Elbow Flexion      Elbow  Extension      Wrist Flexion      Wrist Extension      Wrist Supination      Wrist Pronation      Wrist Ulnar Deviation      Wrist Radial Deviation      Grip Strength NA  NA     (Blank rows = not tested)   * pain ^ tight     TREATMENT DATE:                                                                                                                               10/13/2023  Therapeutic Exercise:  Review of HEP, objective measures and goals updated.  Supine: Prone:  Seated:  scapular shrug and depression x15 5 holds, shoulder ER 2 minutes, shoulder rolls forward and backward- 2 minutes, shoulder SB 2 minutes  Standing:   shoulder extension red band 3x10 B, horizontal shoulder abd low 2x8 B, UE rangers R  and L -5 minutes total Neuromuscular Re-education:   Manual Therapy: STM  and IASTM with wooden tool to B  shoulder and scapular region and Right UE- tolerated well  Therapeutic Activity: Self Care:   Trigger Point Dry Needling:  Modalities:  cryotherapy to shoulders  during seated ROM exercises    PATIENT EDUCATION:  Education details: on HEP  Person educated: Patient Education method: Programmer, multimedia, Facilities manager, and Handouts Education comprehension: verbalized understanding   HOME EXERCISE PROGRAM: [id number]  ASSESSMENT:  CLINICAL IMPRESSION: 10/13/2023 Continued to feel good with manual interventions best. Added new strength exercises which were tolerated OK. Overall patient doing about the same, she has met one goal at this time. Will continue with current POC as tolerated.   EVAL:Patient presents to PT with complaints of chronic  B shoulder pain that has worsening over time. Complex medical history of B mastectomy, right lymphedema, and history of chemo/radiation to R UE contributing to current presentation. Patient with increased swelling in right UE, ROM and strength limitations bilaterally with R>L and postural limitation that are likely contributing to current  presentation. Patient would greatly benefit from skilled PT to address physical impairments and return her to optimal function.   OBJECTIVE IMPAIRMENTS: decreased activity tolerance, decreased ROM, decreased strength, increased edema, impaired UE functional use, improper body mechanics, postural dysfunction, and pain.   ACTIVITY LIMITATIONS: carrying, lifting, sleeping, bathing, dressing, reach over head, and hygiene/grooming  PARTICIPATION LIMITATIONS: meal prep and cleaning  PERSONAL FACTORS: Fitness, Time since onset of injury/illness/exacerbation, and 1-2 comorbidities: B mastectomy, right UE lymphedema are also affecting patient's functional outcome.   REHAB POTENTIAL: Good  CLINICAL DECISION MAKING: Evolving/moderate complexity  EVALUATION COMPLEXITY: Moderate   GOALS: Goals reviewed with patient? yes  SHORT  TERM GOALS: Target date: 10/06/2023   Patient will be independent in self management strategies to improve quality of life and functional outcomes. Baseline: New Program Goal status: PROGRESSING  2.  Patient will report at least 50% improvement in overall symptoms and/or function to demonstrate improved functional mobility Baseline: 0% better Goal status: MET  3.  Patient will report using her pump at least 3x a week to help manage the fluid in her R UE  Baseline: not currently Goal status: PROGRESSING  4.  Patient will report sitting in good posture intermittently throughout the day to reduce stress on neck and shoulder Baseline: not currently Goal status: PROGRESSING    LONG TERM GOALS: Target date: 11/17/2023    Patient will report at least 75% improvement in overall symptoms and/or function to demonstrate improved functional mobility Baseline: 0% better Goal status: PROGRESSING  2.  Patient will score at least 2 points higher on PSFS average to demonstrate change in overall function. Baseline: see above Goal status: PROGRESSING  3.  Patent will demonstrate  at least 90 degrees of right shoulder scaption to improve shoulder mobility in R UE Baseline:  Goal status: PROGRESSING       PLAN:  PT FREQUENCY: 1-2x/week for a total of 16 visits over 12 week certification period   PT DURATION: 12 weeks  PLANNED INTERVENTIONS: 97110-Therapeutic exercises, 97530- Therapeutic activity, 97112- Neuromuscular re-education, 97535- Self Care, [id number]- Manual therapy, [phone]- Gait training, [phone]- Orthotic Fit/training, [phone]- Canalith repositioning, [id number]- Aquatic Therapy, 97014- Electrical stimulation (unattended), [phone]- Ionotophoresis 4mg /ml Dexamethasone, Patient/Family education, Balance training, Stair training, Taping, Dry Needling, Joint mobilization, Joint manipulation, Spinal manipulation, Spinal mobilization, Cryotherapy, and Moist heat   PLAN FOR NEXT SESSION: f/u with lymphedema massage contact information shoulder ROM, manual/ STM, try KT tape to shoulder posture, breathing, core work, f/u with compression sleeve and pump  F/u with thoracic brace    1:41 PM, 10/13/23 Olivia Church, DPT Physical Therapy with [place]

## 2023-10-20 ENCOUNTER — Encounter: Admitting: Physical Therapy

## 2023-10-20 ENCOUNTER — Encounter: Payer: Self-pay | Admitting: Skilled Nursing Facility1

## 2023-10-20 ENCOUNTER — Encounter: Attending: Physician Assistant | Admitting: Skilled Nursing Facility1

## 2023-10-20 VITALS — Ht 64.0 in | Wt 180.4 lb

## 2023-10-20 DIAGNOSIS — E119 Type 2 diabetes mellitus without complications: Secondary | ICD-10-CM | POA: Insufficient documentation

## 2023-10-20 NOTE — Progress Notes (Signed)
 ASSESSMENT  Patient states that she has been having problems with compulsive eating and eating more sugar.  She is not checking her blood glucose stating she needs other options besides a traditional glucometer.  Referral:  Type 2 diabetes  History includes Type 2 diabetes, HLD, HTN, breast cancer in remission. Lymphodema , hypothyroid   Patient lives alone.  Widow.  She moved from [place]  from [place]  about 2021. She is a retired Producer, television/film/video and middle school chorus, piano, and Tax adviser. Cares for her grandson often Eats, chicken, [place], occasional beef. Some issues with knee and wants to avoid a knee replacement.  Shoulder issues. Took a class with a Charity fundraiser at the [employer] (nutrition and exercise)   Pt states she has been having trouble controlling her snacking and has a habit of wanting a dessert with each meal. Pt states she baby sits her grandson often stating she considers that exercise. Pt states her daughter eats well but when she is at home she finds it more difficult. Pt states she has an eradic schedule due to choir practice and her grandchild duties. Pt states she goes to bed around 12am.  Pt states does struggle with emotional eating.  Pt states she is open to working with talk therapy.   Pt advised if she does get a CGM and needs assistance understand the device she can follow up for this.   Diabetes Self-Management Education  Visit Type: Follow-up  Appt. Start Time: 2:01 Appt. End Time: 3:05  10/20/2023  [name], identified by name and date of birth, is a 73 y.o. female with a diagnosis of Diabetes:  .   ASSESSMENT  Height 5' 4 (1.626 m), weight 180 lb 6.4 oz (81.8 kg). Body mass index is 30.97 kg/m.   Diabetes Self-Management Education - 10/20/23 1521       Visit Information   Visit Type Follow-up      Health Coping   How would you rate your overall health? Good      Psychosocial Assessment   Patient Belief/Attitude about Diabetes  Defeat/Burnout    What is the hardest part about your diabetes right now, causing you the most concern, or is the most worrisome to you about your diabetes?   Making healty food and beverage choices;Checking blood sugar    Self-care barriers None    Self-management support Family    Patient Concerns Nutrition/Meal planning;Glycemic Control;Weight Control    Special Needs None    Learning Readiness Contemplating    How often do you need to have someone help you when you read instructions, pamphlets, or other written materials from your doctor or pharmacy? 1 - Never      Pre-Education Assessment   Patient understands the diabetes disease and treatment process. Needs Review    Patient understands incorporating nutritional management into lifestyle. Needs Review    Patient undertands incorporating physical activity into lifestyle. Needs Review    Patient understands using medications safely. Needs Review    Patient understands monitoring blood glucose, interpreting and using results Needs Review    Patient understands prevention, detection, and treatment of acute complications. Needs Review    Patient understands prevention, detection, and treatment of chronic complications. Needs Review    Patient understands how to develop strategies to address psychosocial issues. Needs Review    Patient understands how to develop strategies to promote health/change behavior. Needs Review      Complications   Last HgB A1C per patient/outside source 10.6 %  How often do you check your blood sugar? 0 times/day (not testing)    Number of hypoglycemic episodes per month 0      Activity / Exercise   Activity / Exercise Type ADL's    How many days per week do you exercise? 0    How many minutes per day do you exercise? 0    Total minutes per week of exercise 0      Patient Education   Previous Diabetes Education Yes (please comment)    Healthy Eating Role of diet in the treatment of diabetes and the  relationship between the three main macronutrients and blood glucose level;Food label reading, portion sizes and measuring food.;Plate Method    Being Active Role of exercise on diabetes management, blood pressure control and cardiac health.;Identified with patient nutritional and/or medication changes necessary with exercise.    Diabetes Stress and Support Identified and addressed patients feelings and concerns about diabetes;Role of stress on diabetes;Worked with patient to identify barriers to care and solutions    Lifestyle and Health Coping Lifestyle issues that need to be addressed for better diabetes care      Individualized Goals (developed by patient)   Nutrition Follow meal plan discussed;General guidelines for healthy choices and portions discussed    Physical Activity Exercise 3-5 times per week;15 minutes per day    Monitoring  Test my blood glucose as discussed;Test blood glucose pre and post meals as discussed    Problem Solving Eating Pattern      Patient Self-Evaluation of Goals - Patient rates self as meeting previously set goals (% of time)   Nutrition 25 - 50% (sometimes)    Physical Activity 25 - 50% (sometimes)    Medications >75% (most of the time)    Monitoring < 25% (hardly ever/never)    Problem Solving and behavior change strategies  25 - 50% (sometimes)    Reducing Risk (treating acute and chronic complications) 25 - 50% (sometimes)    Health Coping 25 - 50% (sometimes)      Post-Education Assessment   Patient understands the diabetes disease and treatment process. Demonstrates understanding / competency    Patient understands incorporating nutritional management into lifestyle. Demonstrates understanding / competency    Patient undertands incorporating physical activity into lifestyle. Demonstrates understanding / competency    Patient understands using medications safely. Demonstrates understanding / competency    Patient understands monitoring blood glucose,  interpreting and using results Demonstrates understanding / competency    Patient understands prevention, detection, and treatment of acute complications. Demonstrates understanding / competency    Patient understands prevention, detection, and treatment of chronic complications. Demonstrates understanding / competency    Patient understands how to develop strategies to address psychosocial issues. Demonstrates understanding / competency    Patient understands how to develop strategies to promote health/change behavior. Demonstrates understanding / competency      Outcomes   Expected Outcomes Demonstrated interest in learning but significant barriers to change    Future DMSE 4-6 wks    Program Status Completed      Subsequent Visit   Since your last visit have you continued or begun to take your medications as prescribed? Yes          Individualized Plan for Diabetes Self-Management Training:   Learning Objective:  Patient will have a greater understanding of diabetes self-management. Patient education plan is to attend individual and/or group sessions per assessed needs and concerns.    Expected Outcomes:  Demonstrated interest  in learning but significant barriers to change  Education material provided: ADA - How to Thrive: A Guide for Your Journey with Diabetes, Food label handouts, My Plate, Snack sheet, and Support group flyer  If problems or questions, patient to contact team via:  Phone and Email  Future DSME appointment: 4-6 wks

## 2023-10-27 ENCOUNTER — Ambulatory Visit: Admitting: Physical Therapy

## 2023-10-27 ENCOUNTER — Encounter: Payer: Self-pay | Admitting: Physical Therapy

## 2023-10-27 DIAGNOSIS — G8929 Other chronic pain: Secondary | ICD-10-CM | POA: Diagnosis not present

## 2023-10-27 DIAGNOSIS — M6281 Muscle weakness (generalized): Secondary | ICD-10-CM | POA: Diagnosis not present

## 2023-10-27 DIAGNOSIS — M25511 Pain in right shoulder: Secondary | ICD-10-CM

## 2023-10-27 DIAGNOSIS — M25512 Pain in left shoulder: Secondary | ICD-10-CM | POA: Diagnosis not present

## 2023-10-27 NOTE — Therapy (Signed)
 OUTPATIENT PHYSICAL THERAPY CERVICAL TREATMENT PHYSICAL THERAPY DISCHARGE SUMMARY  Visits from Start of Care: 7  Current functional level related to goals / functional outcomes: See below   Remaining deficits: See below   Education / Equipment: See below   Patient agrees to discharge. Patient goals were not met. Patient is being discharged due to lack of progress.   Patient Name: Amanda Rollins MRN: [id number] DOB:1951-02-15, 73 y.o., female Today's Date: 10/27/2023  END OF SESSION:  PT End of Session - 10/27/23 1300     Visit Number 7    Number of Visits 16    Date for PT Re-Evaluation 11/17/23    Authorization Type medicare BCBS no auth    Progress Note Due on Visit 10    PT Start Time 1301    PT Stop Time 1339    PT Time Calculation (min) 38 min    Activity Tolerance Patient limited by pain    Behavior During Therapy [facility] for tasks assessed/performed          Past Medical History:  Diagnosis Date   Breast cancer (HCC) 2011   Diabetes mellitus type 2, controlled (HCC)    Heart murmur    Hyperlipidemia    Hypertension    Hypothyroidism    Past Surgical History:  Procedure Laterality Date   ABDOMINAL HYSTERECTOMY  2000   Still has ovaries   BELPHAROPTOSIS REPAIR  07/2019   BREAST SURGERY     CATARACT EXTRACTION  2019   Left eye 2019, Right eye 2020   CESAREAN SECTION  12/08/1990   MASTECTOMY  2011   Bilateral   ROBOTIC ASSISTED LAPAROSCOPIC VAGINAL HYSTERECTOMY WITH FIBROID REMOVAL  1991   Patient Active Problem List   Diagnosis Date Noted   History of breast cancer 04/10/2022   Osteoporosis 04/10/2022   Depression, major, single episode, mild (HCC) 04/05/2020   Lymphedema 02/08/2020   Pulmonary nodules 02/08/2020   Atherosclerosis of abdominal aorta (HCC) 02/08/2020   Hypothyroidism    Hypertension    Hyperlipidemia    Heart murmur    Diabetes mellitus type 2, controlled (HCC)    Breast cancer (HCC) 2011    PCP: Job Lukes, PA    REFERRING PROVIDER: Job Lukes, PA   REFERRING DIAG: M25.511,G89.29,M25.512 (ICD-10-CM) - Chronic pain of both shoulders  THERAPY DIAG:  Chronic pain of both shoulders  Muscle weakness (generalized)  Rationale for Evaluation and Treatment: Rehabilitation  ONSET DATE: > 7 months ago  SUBJECTIVE:  SUBJECTIVE STATEMENT: 10/27/2023 States that she woke up in the middle of the night and her shoulders and upper back are killing her. States they aren't as sore as they were last night but still pretty sore. States she was lifting emily a lot yesterday. States her joints hurt everywhere. Overall she feels about 80% better.   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 Reports during that time she fell on her right shoulder and broker her right humerus. She has base line lymphedema in her R UE that she manages but her compression sleeve is > 75 year old and she doesn't use her pump regularly due to the time restraints.  She has has 2 grand children that are 85.58 years old and 27 months old and she lifts them a lot. It hurts when she sleeps on her side.  She also notes occasional pelvic pain and incontinence.     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   Hand dominance: Right  PERTINENT HISTORY:  DB, HTN, cesarean section, B mastectomy, lymphedema history, breast cancer in remission, abdominal hernia, back pain, osteoporosis   PAIN:  Are you having pain? Yes: NPRS scale: 6/10 Pain location: upper shoulders and neck   Pain description:  achy Aggravating factors: reaching a certain way, sleeping on her sides,  Relieving factors: tylenol  PRECAUTIONS: None  RED FLAGS: None     WEIGHT BEARING RESTRICTIONS: No  FALLS:  Has patient fallen in last 6 months? no     OCCUPATION: house sits dogs  PLOF: Independent  PATIENT GOALS: to have less pain    OBJECTIVE:  Note: Objective measures were completed at Evaluation unless otherwise noted.  DIAGNOSTIC FINDINGS:  No recent imaging   PATIENT SURVEYS:  Patient-specific activity functional scoring scheme (Point to one number):  0 represents "unable to perform." 10 represents "able to perform at prior level. 0 1 2 3 4 5 6 7 8 9  10 (Date and Score) Activity Initial  Activity Eval  6/23   Reaching up (putting things away)  3  5  Washing back (reaching behind) 3   1  Sleeping on her side 3 8   Additional Additional Total score = sum of the activity scores/number of activities Minimum detectable change (90%CI) for average score = 2 points Minimum detectable change (90%CI) for single activity score = 3 points PSFS developed by: Rosalee MYRTIS Marvis KYM Charlet CHRISTELLA., & Binkley, J. (1995). Assessing disability and change on individual  patients: a report of a patient specific measure. Physiotherapy [place], 47, [phone]. Reproduced with the permission of the authors  Score: 3= eval  6/23 14/3/4.66   COGNITION: Overall cognitive status: Within functional limits for tasks assessed  SENSATION: Not tested  POSTURE/observation: rounded shoulders, forward head, posterior pelvic tilt, and flexed trunk , R UE swelling throughout- stretched out compression sleeve in place.  PALPATION: Increased tenderness to palpation along right shoulder girdle, shoulder and right trunk    CERVICAL ROM:   Active ROM A/PROM (deg) 6/23  Flexion WFL**  Extension 20*  Right lateral flexion   Left lateral flexion   Right rotation 60  Left rotation 30**   (Blank rows = not  tested) *tight/pulling **discomfort in neck  UE Measurements Upper Extremity Right 6/23 Left 6/23   A/PROM MMT A/PROM MMT  Shoulder Flexion 80^ NT* 145 4-  Shoulder Extension      Shoulder Abduction 80* NT* WFL* 3+*  Shoulder Adduction      Shoulder Internal Rotation      Shoulder External Rotation      Elbow Flexion      Elbow Extension      Wrist Flexion      Wrist Extension      Wrist Supination      Wrist Pronation      Wrist Ulnar Deviation      Wrist Radial Deviation      Grip Strength NA  NA     (Blank rows = not tested)   * pain ^ tight     TREATMENT DATE:                                                                                                                               10/27/2023  Therapeutic Exercise:  Review of HEP Supine: Prone:  Seated:    Standing:   shoulder extension with towel x25 5 holds B, shoulder ER iso - not tolerated on R but tolerated on left, shoulder IR 1 minute each side Neuromuscular Re-education:   Manual Therapy: STM  and IASTM with wooden tool to B  shoulder and scapular region and Right UE- tolerated well  Therapeutic Activity: Self Care:   Trigger Point Dry Needling:  Modalities:     PATIENT EDUCATION:  Education details: on HEP  Person educated: Patient Education method: Explanation, Facilities manager, and Handouts Education comprehension: verbalized understanding   HOME EXERCISE PROGRAM: [id number]  ASSESSMENT:  CLINICAL IMPRESSION: 10/27/2023 Reviewed HEP and answered all questions. Discussed MD f/u secondary to lack of progress and continued pain. Added new isometrics to HEP. Patient to DC from PT to HEP.  EVAL:Patient presents to PT with complaints of chronic  B shoulder pain that has worsening over time. Complex medical history of B mastectomy, right lymphedema, and history of chemo/radiation to R UE contributing to current presentation. Patient with increased swelling in right UE, ROM and strength limitations  bilaterally with R>L and postural limitation that are likely contributing to current presentation. Patient would greatly benefit from skilled PT to address physical impairments and return her to optimal function.   OBJECTIVE IMPAIRMENTS: decreased activity tolerance, decreased ROM, decreased strength, increased edema, impaired UE functional use, improper body mechanics, postural dysfunction, and pain.   ACTIVITY LIMITATIONS: carrying, lifting, sleeping, bathing, dressing, reach over head, and hygiene/grooming  PARTICIPATION LIMITATIONS: meal prep and cleaning  PERSONAL FACTORS: Fitness, Time since onset of injury/illness/exacerbation, and 1-2 comorbidities: B mastectomy, right UE lymphedema are also affecting patient's functional outcome.   REHAB POTENTIAL: Good  CLINICAL DECISION MAKING: Evolving/moderate complexity  EVALUATION COMPLEXITY: Moderate   GOALS: Goals reviewed with patient? yes  SHORT TERM GOALS: Target date: 10/06/2023   Patient will be independent  in self management strategies to improve quality of life and functional outcomes. Baseline: New Program Goal status: PROGRESSING  2.  Patient will report at least 50% improvement in overall symptoms and/or function to demonstrate improved functional mobility Baseline: 0% better Goal status: MET  3.  Patient will report using her pump at least 3x a week to help manage the fluid in her R UE  Baseline: not currently Goal status: PROGRESSING  4.  Patient will report sitting in good posture intermittently throughout the day to reduce stress on neck and shoulder Baseline: not currently Goal status: PROGRESSING    LONG TERM GOALS: Target date: 11/17/2023    Patient will report at least 75% improvement in overall symptoms and/or function to demonstrate improved functional mobility Baseline: 0% better Goal status: MET  2.  Patient will score at least 2 points higher on PSFS average to demonstrate change in overall  function. Baseline: see above Goal status: PROGRESSING  3.  Patent will demonstrate at least 90 degrees of right shoulder scaption to improve shoulder mobility in R UE Baseline:  Goal status: PROGRESSING       PLAN:  PT FREQUENCY: 1-2x/week for a total of 16 visits over 12 week certification period   PT DURATION: 12 weeks  PLANNED INTERVENTIONS: 97110-Therapeutic exercises, 97530- Therapeutic activity, 97112- Neuromuscular re-education, 97535- Self Care, 02859- Manual therapy, [phone]- Gait training, [phone]- Orthotic Fit/training, [phone]- Canalith repositioning, [id number]- Aquatic Therapy, [id number]- Electrical stimulation (unattended), [phone]- Ionotophoresis 4mg /ml Dexamethasone, Patient/Family education, Balance training, Stair training, Taping, Dry Needling, Joint mobilization, Joint manipulation, Spinal manipulation, Spinal mobilization, Cryotherapy, and Moist heat   PLAN FOR NEXT SESSION: DC to HEP    1:39 PM, 10/27/23 Olivia Church, DPT Physical Therapy with 

## 2023-10-31 ENCOUNTER — Ambulatory Visit: Admitting: Physician Assistant

## 2023-11-03 ENCOUNTER — Ambulatory Visit: Admitting: Physician Assistant

## 2023-11-04 ENCOUNTER — Other Ambulatory Visit: Payer: Self-pay | Admitting: Physician Assistant

## 2023-11-05 ENCOUNTER — Ambulatory Visit: Admitting: Physician Assistant

## 2023-11-05 ENCOUNTER — Encounter: Payer: Self-pay | Admitting: Physician Assistant

## 2023-11-05 VITALS — BP 110/70 | HR 78 | Temp 97.9°F | Ht 64.0 in | Wt 181.4 lb

## 2023-11-05 DIAGNOSIS — E11649 Type 2 diabetes mellitus with hypoglycemia without coma: Secondary | ICD-10-CM | POA: Diagnosis not present

## 2023-11-05 DIAGNOSIS — M25532 Pain in left wrist: Secondary | ICD-10-CM | POA: Diagnosis not present

## 2023-11-05 DIAGNOSIS — E1165 Type 2 diabetes mellitus with hyperglycemia: Secondary | ICD-10-CM | POA: Diagnosis not present

## 2023-11-05 DIAGNOSIS — Z7984 Long term (current) use of oral hypoglycemic drugs: Secondary | ICD-10-CM

## 2023-11-05 MED ORDER — DICLOFENAC SODIUM 50 MG PO TBEC: 50.0000 mg | DELAYED_RELEASE_TABLET | Freq: Two times a day (BID) | ORAL | 1 refills | Status: DC

## 2023-11-05 MED ORDER — FREESTYLE LIBRE 3 PLUS SENSOR MISC: 2 refills | Status: DC

## 2023-11-05 NOTE — Patient Instructions (Signed)
 It was great to see you!  Follow up in end of August to recheck your Hemoglobin A1c  Trial the exercises, topical Voltaren , oral diclofenac , consider wrist brace If no improvement, please schedule with Dr Leonce  Take care,  Lucie Buttner PA-C

## 2023-11-05 NOTE — Progress Notes (Signed)
 Amanda Rollins is a 73 y.o. female here for a follow up of a pre-existing problem.  History of Present Illness:   Chief Complaint  Patient presents with   Joint Pain    Pt c/o joint pain both shoulders, both hands left > right. Alternating between Tylenol and Ibuprofen.    HPI  Diabetes  Pt is on Jardiance  25 mg daily and metformin  500 mg XR daily Pt is interested in a glucometer. No other concerns reported. She has had 3 episodes of low blood sugar in the past two weeks -- has had dizziness and nausea that prompted her to check her blood sugar - each time her blood sugar has been in the 50's  Joint pain Pt complains of joint pain in both shoulders and both hands.  She reports more pain in her left hand than her right hand. At this time, she reports her left hand is her worst concern. She states the pain has worsened over time and has not been able to do as much with her hands. The other night, she woke up and had difficulty moving fingers on her left hand due to the pain. She also reports a shock running through two of her fingers on her left hand when taking her grandchild out of his car seat. Pt has been on Meloxicam  in the past and states it did not help her much. Endorses alternating Ibuprofen and Tylenol, ice, and Aspercream. Denies falling. She has seen PT for her shoulders and has seen Dr. Leonce of sports med in the past.  Past Medical History:  Diagnosis Date   Breast cancer (HCC) 2011   Diabetes mellitus type 2, controlled (HCC)    Heart murmur    Hyperlipidemia    Hypertension    Hypothyroidism      Social History   Tobacco Use   Smoking status: Former    Types: Cigarettes    Start date: 02/07/1970   Smokeless tobacco: Never  Substance Use Topics   Alcohol use: Not Currently    Alcohol/week: 3.0 standard drinks of alcohol    Types: 3 Glasses of wine per week   Drug use: Never    Past Surgical History:  Procedure Laterality Date   ABDOMINAL  HYSTERECTOMY  2000   Still has ovaries   BELPHAROPTOSIS REPAIR  07/2019   BREAST SURGERY     CATARACT EXTRACTION  2019   Left eye 2019, Right eye 2020   CESAREAN SECTION  12/08/1990   MASTECTOMY  2011   Bilateral   ROBOTIC ASSISTED LAPAROSCOPIC VAGINAL HYSTERECTOMY WITH FIBROID REMOVAL  1991    Family History  Adopted: Yes  Problem Relation Age of Onset   Cancer Mother        small cell cancer of liver and lungs    Allergies  Allergen Reactions   Sulfa Antibiotics Rash   Metformin  Diarrhea    Current Medications:   Current Outpatient Medications:    alendronate  (FOSAMAX ) 70 MG tablet, TAKE 1 TABLET(70 MG) BY MOUTH 1 TIME A WEEK WITH A FULL GLASS OF WATER AND ON AN EMPTY STOMACH, Disp: 12 tablet, Rfl: 3   calcium -vitamin D  (OSCAL WITH D) 250-125 MG-UNIT tablet, Take 1 tablet by mouth daily., Disp: , Rfl:    diclofenac  (VOLTAREN ) 50 MG EC tablet, Take 1 tablet (50 mg total) by mouth 2 (two) times daily., Disp: 30 tablet, Rfl: 1   gabapentin  (NEURONTIN ) 300 MG capsule, Take 1 capsule (300 mg total) by mouth at bedtime.,  Disp: 90 capsule, Rfl: 1   JARDIANCE  25 MG TABS tablet, TAKE 1 TABLET(25 MG) BY MOUTH DAILY, Disp: 90 tablet, Rfl: 1   levothyroxine  (SYNTHROID ) 50 MCG tablet, TAKE 1 TABLET(50 MCG) BY MOUTH DAILY, Disp: 90 tablet, Rfl: 1   losartan -hydrochlorothiazide (HYZAAR) 100-12.5 MG tablet, TAKE 1 TABLET BY MOUTH DAILY, Disp: 90 tablet, Rfl: 1   magnesium gluconate (MAGONATE) 500 MG tablet, Take 500 mg by mouth 2 (two) times daily., Disp: , Rfl:    metFORMIN  (GLUCOPHAGE -XR) 500 MG 24 hr tablet, Take 1 tablet (500 mg total) by mouth daily with breakfast., Disp: 90 tablet, Rfl: 1   metoprolol  succinate (TOPROL -XL) 25 MG 24 hr tablet, TAKE 1 TABLET(25 MG) BY MOUTH DAILY, Disp: 90 tablet, Rfl: 1   omeprazole (PRILOSEC) 10 MG capsule, Take 10 mg by mouth daily., Disp: , Rfl:    rosuvastatin  (CRESTOR ) 5 MG tablet, TAKE 1 TABLET(5 MG) BY MOUTH DAILY, Disp: 90 tablet, Rfl: 1    traZODone  (DESYREL ) 50 MG tablet, TAKE 1/2 TO 1 TABLET(25 TO 50 MG) BY MOUTH AT BEDTIME AS NEEDED FOR SLEEP, Disp: 30 tablet, Rfl: 3   Review of Systems:   Negative unless otherwise specified per HPI.  Vitals:   Vitals:   11/05/23 0939  BP: 110/70  Pulse: 78  Temp: 97.9 F (36.6 C)  TempSrc: Temporal  SpO2: 97%  Weight: 181 lb 6.1 oz (82.3 kg)  Height: 5' 4 (1.626 m)     Body mass index is 31.13 kg/m.  Physical Exam:   Physical Exam Vitals and nursing note reviewed.  Constitutional:      General: She is not in acute distress.    Appearance: She is well-developed. She is not ill-appearing or toxic-appearing.  Cardiovascular:     Rate and Rhythm: Normal rate and regular rhythm.     Pulses: Normal pulses.     Heart sounds: Normal heart sounds, S1 normal and S2 normal.  Pulmonary:     Effort: Pulmonary effort is normal.     Breath sounds: Normal breath sounds.  Musculoskeletal:     Comments: Left wrist with tenderness to palpation, generalized with increase tenderness to palpation to cmc joint  Limited range of motion with flexion and extension due to pain Positive finkelstein's test  Skin:    General: Skin is warm and dry.  Neurological:     Mental Status: She is alert.     GCS: GCS eye subscore is 4. GCS verbal subscore is 5. GCS motor subscore is 6.  Psychiatric:        Speech: Speech normal.        Behavior: Behavior normal. Behavior is cooperative.     Assessment and Plan:   1. Low blood sugar in diabetes (HCC) (Primary) Patient would greatly benefit from freestyle libre 3 plus -- I have ordered this I recommend regular intake of carbohydrate intake with protein throughout the day to avoid low blood sugars Recommend glucose gels/tabs/etc at bedside If low blood sugars persist, she was advised to reach out  2. Type 2 diabetes mellitus with hyperglycemia, without long-term current use of insulin (HCC) Too soon to recheck Hemoglobin A1c Continue Jardiance   25 mg daily and metformin  500 mg XR daily Follow up in 2 months for Hemoglobin A1c recheck, sooner if concerns  3. Left wrist pain Suspect arthritis as well as tenosynovitis Handout provided Advised as follows: Trial the exercises, topical Voltaren , oral diclofenac , consider wrist brace If no improvement, please schedule with Dr Leonce  I, Lavern Simmers, acting as a Neurosurgeon for [employer], [place]., have documented all relevant documentation on the behalf of Lucie Buttner, [place], as directed by Lucie Buttner, PA while in the presence of Lucie Buttner, [place].  I, Lucie Buttner, [place], have reviewed all documentation for this visit. The documentation on 11/05/23 for the exam, diagnosis, procedures, and orders are all accurate and complete.  Lucie Buttner, PA-C

## 2023-11-22 ENCOUNTER — Other Ambulatory Visit: Payer: Self-pay | Admitting: Physician Assistant

## 2023-11-24 ENCOUNTER — Ambulatory Visit: Admitting: Physician Assistant

## 2023-11-24 ENCOUNTER — Ambulatory Visit (INDEPENDENT_AMBULATORY_CARE_PROVIDER_SITE_OTHER)

## 2023-11-24 VITALS — BP 118/64 | HR 74 | Temp 97.5°F | Ht 64.0 in | Wt 181.0 lb

## 2023-11-24 DIAGNOSIS — Z0000 Encounter for general adult medical examination without abnormal findings: Secondary | ICD-10-CM | POA: Diagnosis not present

## 2023-11-24 DIAGNOSIS — E11649 Type 2 diabetes mellitus with hypoglycemia without coma: Secondary | ICD-10-CM

## 2023-11-24 NOTE — Patient Instructions (Signed)
 Ms. Wingard , Thank you for taking time out of your busy schedule to complete your Annual Wellness Visit with me. I enjoyed our conversation and look forward to speaking with you again next year. I, as well as your care team,  appreciate your ongoing commitment to your health goals. Please review the following plan we discussed and let me know if I can assist you in the future. Your Game plan/ To Do List    Referrals: If you haven't heard from the office you've been referred to, please reach out to them at the phone provided.   Follow up Visits: We will see or speak with you next year for your Next Medicare AWV with our clinical staff Have you seen your provider in the last 6 months (3 months if uncontrolled diabetes)? Yes  Clinician Recommendations:  Aim for 30 minutes of exercise or brisk walking, 6-8 glasses of water, and 5 servings of fruits and vegetables each day.       This is a list of the screenings recommended for you:  Health Maintenance  Topic Date Due   Yearly kidney health urinalysis for diabetes  Never done   Complete foot exam   07/26/2021   Medicare Annual Wellness Visit  07/04/2023   Flu Shot  11/21/2023   DTaP/Tdap/Td vaccine (2 - Tdap) 05/18/2024*   Eye exam for diabetics  01/20/2024   Hemoglobin A1C  03/19/2024   Yearly kidney function blood test for diabetes  09/16/2024   Pneumococcal Vaccine for age over 5  Completed   DEXA scan (bone density measurement)  Completed   Hepatitis C Screening  Completed   Hepatitis B Vaccine  Aged Out   HPV Vaccine  Aged Out   Meningitis B Vaccine  Aged Out   Colon Cancer Screening  Discontinued   COVID-19 Vaccine  Discontinued   Zoster (Shingles) Vaccine  Discontinued  *Topic was postponed. The date shown is not the original due date.    Advanced directives: (Copy Requested) Please bring a copy of your health care power of attorney and living will to the office to be added to your chart at your convenience. You can mail to [facility] [address] [place], [place] [address] or email to [email or web address] Advance Care Planning is important because it:  [x]  Makes sure you receive the medical care that is consistent with your values, goals, and preferences  [x]  It provides guidance to your family and loved ones and reduces their decisional burden about whether or not they are making the right decisions based on your wishes.  Follow the link provided in your after visit summary or read over the paperwork we have mailed to you to help you started getting your Advance Directives in place. If you need assistance in completing these, please reach out to us  so that we can help you!  See attachments for Preventive Care and Fall Prevention Tips.

## 2023-11-24 NOTE — Progress Notes (Signed)
 Subjective:   Amanda Rollins is a 73 y.o. who presents for a Medicare Wellness preventive visit.  As a reminder, Annual Wellness Visits don't include a physical exam, and some assessments may be limited, especially if this visit is performed virtually. We may recommend an in-person follow-up visit with your provider if needed.  Visit Complete: In person    Persons Participating in Visit: Patient.  AWV Questionnaire: Yes: Patient Medicare AWV questionnaire was completed by the patient on 11/24/23; I have confirmed that all information answered by patient is correct and no changes since this date.  Cardiac Risk Factors include: advanced age (>28men, >56 women);dyslipidemia;hypertension;obesity (BMI >30kg/m2)     Objective:    Today's Vitals   11/24/23 1211  BP: 118/64  Pulse: 74  Temp: (!) 97.5 F (36.4 C)  SpO2: 97%  Weight: 181 lb (82.1 kg)  Height: 5' 4 (1.626 m)   Body mass index is 31.07 kg/m.     11/24/2023   12:19 PM 08/25/2023    1:07 PM 08/30/2022    9:18 AM 07/04/2022    8:13 AM 06/22/2021    1:47 PM 02/28/2021    8:52 AM 05/26/2020    1:20 PM  Advanced Directives  Does Patient Have a Medical Advance Directive? Yes Yes Yes Yes Yes No Yes  Type of Estate agent of [place];Living will   Healthcare Power of [place];Living will Living will  Healthcare Power of Attorney  Does patient want to make changes to medical advance directive?  No - Patient declined No - Patient declined      Copy of Healthcare Power of Attorney in Chart? No - copy requested   No - copy requested   No - copy requested  Would patient like information on creating a medical advance directive?      No - Patient declined     Current Medications (verified) Outpatient Encounter Medications as of 11/24/2023  Medication Sig   alendronate  (FOSAMAX ) 70 MG tablet TAKE 1 TABLET(70 MG) BY MOUTH 1 TIME A WEEK WITH A FULL GLASS OF WATER AND ON AN EMPTY STOMACH   calcium -vitamin D  (OSCAL  WITH D) 250-125 MG-UNIT tablet Take 1 tablet by mouth daily.   Continuous Glucose Sensor (FREESTYLE LIBRE 3 PLUS SENSOR) MISC Change sensor every 15 days.   diclofenac  (VOLTAREN ) 50 MG EC tablet TAKE 1 TABLET(50 MG) BY MOUTH TWICE DAILY   gabapentin  (NEURONTIN ) 300 MG capsule Take 1 capsule (300 mg total) by mouth at bedtime.   JARDIANCE  25 MG TABS tablet TAKE 1 TABLET(25 MG) BY MOUTH DAILY   levothyroxine  (SYNTHROID ) 50 MCG tablet TAKE 1 TABLET(50 MCG) BY MOUTH DAILY   losartan -hydrochlorothiazide (HYZAAR) 100-12.5 MG tablet TAKE 1 TABLET BY MOUTH DAILY   magnesium gluconate (MAGONATE) 500 MG tablet Take 500 mg by mouth 2 (two) times daily.   metFORMIN  (GLUCOPHAGE -XR) 500 MG 24 hr tablet Take 1 tablet (500 mg total) by mouth daily with breakfast.   metoprolol  succinate (TOPROL -XL) 25 MG 24 hr tablet TAKE 1 TABLET(25 MG) BY MOUTH DAILY   omeprazole (PRILOSEC) 10 MG capsule Take 10 mg by mouth daily.   rosuvastatin  (CRESTOR ) 5 MG tablet TAKE 1 TABLET(5 MG) BY MOUTH DAILY   traZODone  (DESYREL ) 50 MG tablet TAKE 1/2 TO 1 TABLET(25 TO 50 MG) BY MOUTH AT BEDTIME AS NEEDED FOR SLEEP   [DISCONTINUED] diclofenac  (VOLTAREN ) 50 MG EC tablet Take 1 tablet (50 mg total) by mouth 2 (two) times daily.   [DISCONTINUED] rosuvastatin  (CRESTOR )  5 MG tablet TAKE 1 TABLET(5 MG) BY MOUTH DAILY   No facility-administered encounter medications on file as of 11/24/2023.    Allergies (verified) Sulfa antibiotics and Metformin    History: Past Medical History:  Diagnosis Date   Breast cancer (HCC) 2011   Diabetes mellitus type 2, controlled (HCC)    Heart murmur    Hyperlipidemia    Hypertension    Hypothyroidism    Past Surgical History:  Procedure Laterality Date   ABDOMINAL HYSTERECTOMY  2000   Still has ovaries   BELPHAROPTOSIS REPAIR  07/2019   BREAST SURGERY     CATARACT EXTRACTION  2019   Left eye 2019, Right eye 2020   CESAREAN SECTION  12/08/1990   MASTECTOMY  2011   Bilateral   ROBOTIC  ASSISTED LAPAROSCOPIC VAGINAL HYSTERECTOMY WITH FIBROID REMOVAL  1991   Family History  Adopted: Yes  Problem Relation Age of Onset   Cancer Mother        small cell cancer of liver and lungs   Social History   Socioeconomic History   Marital status: Widowed    Spouse name: Not on file   Number of children: Not on file   Years of education: Not on file   Highest education level: Master's degree (e.g., MA, MS, MEng, MEd, MSW, MBA)  Occupational History   Occupation: Retired   Tobacco Use   Smoking status: Former    Types: Cigarettes    Start date: 02/07/1970   Smokeless tobacco: Never  Substance and Sexual Activity   Alcohol use: Not Currently    Alcohol/week: 3.0 standard drinks of alcohol    Types: 3 Glasses of wine per week   Drug use: Never   Sexual activity: Not on file  Other Topics Concern   Not on file  Social History Narrative   Widow   Moved from [place]  in about    Chiropractor -- former   Social Drivers of Corporate investment banker Strain: Low Risk  (11/24/2023)   Overall Financial Resource Strain (CARDIA)    Difficulty of Paying Living Expenses: Not hard at all  Food Insecurity: No Food Insecurity (11/24/2023)   Hunger Vital Sign    Worried About Running Out of Food in the Last Year: Never true    Ran Out of Food in the Last Year: Never true  Transportation Needs: No Transportation Needs (11/24/2023)   PRAPARE - Administrator, Civil Service (Medical): No    Lack of Transportation (Non-Medical): No  Physical Activity: Insufficiently Active (11/24/2023)   Exercise Vital Sign    Days of Exercise per Week: 4 days    Minutes of Exercise per Session: 20 min  Stress: No Stress Concern Present (11/24/2023)   [employer] of Occupational Health - Occupational Stress Questionnaire    Feeling of Stress: Not at all  Social Connections: Moderately Integrated (11/24/2023)   Social Connection and Isolation Panel    Frequency of Communication with  Friends and Family: More than three times a week    Frequency of Social Gatherings with Friends and Family: More than three times a week    Attends Religious Services: More than 4 times per year    Active Member of [employer] or Organizations: Yes    Attends Banker Meetings: 1 to 4 times per year    Marital Status: Widowed    Tobacco Counseling Counseling given: Not Answered    Clinical Intake:  Pre-visit preparation completed: Yes  Pain :  No/denies pain     BMI - recorded: 31.07 Nutritional Status: BMI > 30  Obese Nutritional Risks: None Diabetes: Yes CBG done?: Yes (165 per pt after breakfast) CBG resulted in Enter/ Edit results?: No Did pt. bring in CBG monitor from home?: No  Lab Results  Component Value Date   HGBA1C 10.6 (H) 09/17/2023   HGBA1C 10.7 (H) 05/19/2023   HGBA1C 10.4 (H) 11/01/2022     How often do you need to have someone help you when you read instructions, pamphlets, or other written materials from your doctor or pharmacy?: 1 - Never  Interpreter Needed?: No  Information entered by :: Ellouise Haws, LPN   Activities of Daily Living     11/24/2023    8:21 AM  In your present state of health, do you have any difficulty performing the following activities:  Hearing? 0  Vision? 0  Difficulty concentrating or making decisions? 0  Walking or climbing stairs? 0  Dressing or bathing? 0  Doing errands, shopping? 0  Preparing Food and eating ? N  Using the Toilet? N  In the past six months, have you accidently leaked urine? N  Do you have problems with loss of bowel control? N  Managing your Medications? N  Managing your Finances? N  Housekeeping or managing your Housekeeping? N    Patient Care Team: Job Lukes, [place] as PCP - General (Physician Assistant) Michele Richardson, DO as PCP - Cardiology (Cardiology)  I have updated your Care Teams any recent Medical Services you may have received from other providers in the past year.      Assessment:   This is a routine wellness examination for Amanda Rollins.  Hearing/Vision screen Hearing Screening - Comments:: Pt denies any heaRING  issues  Vision Screening - Comments:: Wears rx glasses - up to date with routine eye exams with Dr Adine Way[facility]    Goals Addressed             This Visit's Progress    Patient Stated       Weight loss        Depression Screen     11/24/2023   12:22 PM 05/19/2023   11:38 AM 12/24/2022   11:32 AM 10/02/2022    9:04 AM 08/30/2022    9:18 AM 07/04/2022    8:11 AM 12/31/2021   10:26 AM  PHQ 2/9 Scores  PHQ - 2 Score 0 0 0 0 0 0 0    Fall Risk     11/24/2023    8:21 AM 08/30/2022    9:18 AM 07/04/2022    8:13 AM 12/31/2021   10:26 AM 06/22/2021    1:47 PM  Fall Risk   Falls in the past year? 0 0 0 0 0  Number falls in past yr: 0  0 0 0  Injury with Fall? 0  0 0 0  Risk for fall due to : No Fall Risks  Impaired vision    Follow up Falls prevention discussed  Falls prevention discussed  Falls prevention discussed      Data saved with a previous flowsheet row definition    MEDICARE RISK AT HOME:  Medicare Risk at Home Any stairs in or around the home?: (Patient-Rptd) No If so, are there any without handrails?: (Patient-Rptd) No Home free of loose throw rugs in walkways, pet beds, electrical cords, etc?: (Patient-Rptd) Yes Adequate lighting in your home to reduce risk of falls?: (Patient-Rptd) Yes Life alert?: (Patient-Rptd) No Use  of a cane, walker or w/c?: (Patient-Rptd) No Grab bars in the bathroom?: (Patient-Rptd) No Shower chair or bench in shower?: (Patient-Rptd) No Elevated toilet seat or a handicapped toilet?: (Patient-Rptd) Yes  TIMED UP AND GO:  Was the test performed?  Yes  Length of time to ambulate 10 feet: 10 sec Gait steady and fast without use of assistive device  Cognitive Function: 6CIT completed        11/24/2023   12:21 PM 07/04/2022    8:15 AM 06/22/2021    1:48 PM 05/26/2020    1:24  PM  6CIT Screen  What Year? 0 points 0 points 0 points 0 points  What month? 0 points 0 points 0 points 0 points  What time? 0 points 0 points 0 points   Count back from 20 0 points 0 points 0 points 0 points  Months in reverse 0 points 0 points 0 points 0 points  Repeat phrase 0 points 0 points 2 points 2 points  Total Score 0 points 0 points 2 points     Immunizations Immunization History  Administered Date(s) Administered   Fluad Quad(high Dose 65+) 04/07/2019, 03/27/2020, 01/18/2021   Influenza Split 02/05/2012   Moderna Sars-Covid-2 Vaccination 06/25/2019, 07/23/2019, 04/11/2020   Pneumococcal Conjugate-13 12/10/2017   Pneumococcal Polysaccharide-23 03/24/2019   Td 03/02/2010    Screening Tests Health Maintenance  Topic Date Due   Diabetic kidney evaluation - Urine ACR  Never done   FOOT EXAM  07/26/2021   INFLUENZA VACCINE  11/21/2023   DTaP/Tdap/Td (2 - Tdap) 05/18/2024 (Originally 03/02/2020)   OPHTHALMOLOGY EXAM  01/20/2024   HEMOGLOBIN A1C  03/19/2024   Diabetic kidney evaluation - eGFR measurement  09/16/2024   Medicare Annual Wellness (AWV)  11/23/2024   Pneumococcal Vaccine: 50+ Years  Completed   DEXA SCAN  Completed   Hepatitis C Screening  Completed   Hepatitis B Vaccines  Aged Out   HPV VACCINES  Aged Out   Meningococcal B Vaccine  Aged Out   Colonoscopy  Discontinued   COVID-19 Vaccine  Discontinued   Zoster Vaccines- Shingrix  Discontinued    Health Maintenance  Health Maintenance Due  Topic Date Due   Diabetic kidney evaluation - Urine ACR  Never done   FOOT EXAM  07/26/2021   INFLUENZA VACCINE  11/21/2023   Health Maintenance Items Addressed: See Nurse Notes at the end of this note  Additional Screening:  Vision Screening: Recommended annual ophthalmology exams for early detection of glaucoma and other disorders of the eye. Would you like a referral to an eye doctor? No    Dental Screening: Recommended annual dental exams for proper  oral hygiene  Community Resource Referral / Chronic Care Management: CRR required this visit?  No   CCM required this visit?  No   Plan:    I have personally reviewed and noted the following in the patient's chart:   Medical and social history Use of alcohol, tobacco or illicit drugs  Current medications and supplements including opioid prescriptions. Patient is not currently taking opioid prescriptions. Functional ability and status Nutritional status Physical activity Advanced directives List of other physicians Hospitalizations, surgeries, and ER visits in previous 12 months Vitals Screenings to include cognitive, depression, and falls Referrals and appointments  In addition, I have reviewed and discussed with patient certain preventive protocols, quality metrics, and best practice recommendations. A written personalized care plan for preventive services as well as general preventive health recommendations were provided to patient.   Ellouise  H Zayden Maffei, LPN   04/26/7972   After Visit Summary: (MyChart) Due to this being a telephonic visit, the after visit summary with patients personalized plan was offered to patient via MyChart   Notes: Nothing significant to report at this time. Pt foot exam due at time of next appt

## 2023-12-04 ENCOUNTER — Institutional Professional Consult (permissible substitution) (INDEPENDENT_AMBULATORY_CARE_PROVIDER_SITE_OTHER): Admitting: Otolaryngology

## 2023-12-08 ENCOUNTER — Ambulatory Visit (INDEPENDENT_AMBULATORY_CARE_PROVIDER_SITE_OTHER): Admitting: Otolaryngology

## 2023-12-08 ENCOUNTER — Encounter (INDEPENDENT_AMBULATORY_CARE_PROVIDER_SITE_OTHER): Payer: Self-pay | Admitting: Otolaryngology

## 2023-12-08 VITALS — BP 151/75 | HR 71

## 2023-12-08 DIAGNOSIS — H9313 Tinnitus, bilateral: Secondary | ICD-10-CM

## 2023-12-08 DIAGNOSIS — H9193 Unspecified hearing loss, bilateral: Secondary | ICD-10-CM | POA: Diagnosis not present

## 2023-12-08 DIAGNOSIS — R490 Dysphonia: Secondary | ICD-10-CM

## 2023-12-08 DIAGNOSIS — J383 Other diseases of vocal cords: Secondary | ICD-10-CM

## 2023-12-08 DIAGNOSIS — J312 Chronic pharyngitis: Secondary | ICD-10-CM

## 2023-12-08 NOTE — Progress Notes (Signed)
 ENT CONSULT:  Reason for Consult: dysphonia    HPI: Discussed the use of AI scribe software for clinical note transcription with the patient, who gave verbal consent to proceed.  History of Present Illness Amanda Rollins is a 73 year old female with a history of breast cancer and lymphedema who presents with inconsistent voice and throat irritation.  She has experienced inconsistent voice quality while singing in the church choir over the past year, with her voice going 'in and out'. She has persistent throat irritation, described as a sore throat or discomfort, occurring about 95% of the time. No recent illness or overexertion related to singing or talking. She occasionally experiences pain with swallowing.  She has a history of gastroesophageal reflux disease (GERD) for which she takes omeprazole, which helps for the most part. She also takes Claritin, which provides some relief.  She has a history of breast cancer diagnosed in 2011, for which she underwent surgery.  She is also a type 2 diabetic and has recently changed her dietary plan, which she notes has helped with her condition.  She reports bilateral tinnitus, described as a buzzing sound, and acknowledges difficulty hearing initial consonants.  Records Reviewed:  PCP visit with Lucie Buttner PA  Diabetes  Pt is on Jardiance  25 mg daily and metformin  500 mg XR daily Pt is interested in a glucometer. No other concerns reported. She has had 3 episodes of low blood sugar in the past two weeks -- has had dizziness and nausea that prompted her to check her blood sugar - each time her blood sugar has been in the 50's   Joint pain Pt complains of joint pain in both shoulders and both hands.  She reports more pain in her left hand than her right hand. At this time, she reports her left hand is her worst concern. She states the pain has worsened over time and has not been able to do as much with her hands. The other  night, she woke up and had difficulty moving fingers on her left hand due to the pain. She also reports a shock running through two of her fingers on her left hand when taking her grandchild out of his car seat. Pt has been on Meloxicam  in the past and states it did not help her much. Endorses alternating Ibuprofen and Tylenol, ice, and Aspercream. Denies falling. She has seen PT for her shoulders and has seen Dr. Leonce of sports med in the past.   Past Medical History:  Diagnosis Date   Breast cancer (HCC) 2011   Diabetes mellitus type 2, controlled (HCC)    Heart murmur    Hyperlipidemia    Hypertension    Hypothyroidism     Past Surgical History:  Procedure Laterality Date   ABDOMINAL HYSTERECTOMY  2000   Still has ovaries   BELPHAROPTOSIS REPAIR  07/2019   BREAST SURGERY     CATARACT EXTRACTION  2019   Left eye 2019, Right eye 2020   CESAREAN SECTION  12/08/1990   MASTECTOMY  2011   Bilateral   ROBOTIC ASSISTED LAPAROSCOPIC VAGINAL HYSTERECTOMY WITH FIBROID REMOVAL  1991    Family History  Adopted: Yes  Problem Relation Age of Onset   Cancer Mother        small cell cancer of liver and lungs    Social History:  reports that she has quit smoking. Her smoking use included cigarettes. She started smoking about 53 years ago. She has never  used smokeless tobacco. She reports that she does not currently use alcohol after a past usage of about 3.0 standard drinks of alcohol per week. She reports that she does not use drugs.  Allergies:  Allergies  Allergen Reactions   Sulfa Antibiotics Rash   Metformin  Diarrhea    Medications: I have reviewed the patient's current medications.  The PMH, PSH, Medications, Allergies, and SH were reviewed and updated.  ROS: Constitutional: Negative for fever, weight loss and weight gain. Cardiovascular: Negative for chest pain and dyspnea on exertion. Respiratory: Is not experiencing shortness of breath at rest. Gastrointestinal:  Negative for nausea and vomiting. Neurological: Negative for headaches. Psychiatric: The patient is not nervous/anxious  Blood pressure (!) 151/75, pulse 71, SpO2 93%. There is no height or weight on file to calculate BMI.  PHYSICAL EXAM:  Exam: General: Well-developed, well-nourished Communication and Voice: Clear pitch and clarity Respiratory Respiratory effort: Equal inspiration and expiration without stridor Cardiovascular Peripheral Vascular: Warm extremities with equal color/perfusion Eyes: No nystagmus with equal extraocular motion bilaterally Neuro/Psych/Balance: Patient oriented to person, place, and time; Appropriate mood and affect; Gait is intact with no imbalance; Cranial nerves I-XII are intact Head and Face Inspection: Normocephalic and atraumatic without mass or lesion Palpation: Facial skeleton intact without bony stepoffs Salivary Glands: No mass or tenderness Facial Strength: Facial motility symmetric and full bilaterally ENT Pinna: External ear intact and fully developed External canal: Canal is patent with intact skin Tympanic Membrane: Clear and mobile External Nose: No scar or anatomic deformity Internal Nose: Septum is straight. No polyp, or purulence. Mucosal edema and erythema present.  Bilateral inferior turbinate hypertrophy.  Lips, Teeth, and gums: Mucosa and teeth intact and viable TMJ: No pain to palpation with full mobility Oral cavity/oropharynx: No erythema or exudate, no lesions present Nasopharynx: No mass or lesion with intact mucosa Hypopharynx: Intact mucosa without pooling of secretions Larynx Glottic: Full true vocal cord mobility without lesion or mass VF atrophy Supraglottic: Normal appearing epiglottis and AE folds Interarytenoid Space: Moderate pachydermia&edema Subglottic Space: Patent without lesion or edema Neck Neck and Trachea: Midline trachea without mass or lesion Thyroid : No mass or nodularity Lymphatics: No  lymphadenopathy  Procedure: Preoperative diagnosis: dysphonia  Postoperative diagnosis:   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 Indirect laryngoscopy view was incomplete. Thus it was recommended that they undergo a flexible fiberoptic laryngoscopy. All of the risks, benefits, and potential complications were reviewed with the patient preoperatively and verbal informed consent was obtained.  Procedure: The patient was seated upright in the clinic. Topical lidocaine and Afrin were applied to the nasal cavity. After adequate anesthesia had occurred, I then proceeded to pass the flexible telescope into the nasal cavity. The nasal cavity was patent without rhinorrhea or polyp. The nasopharynx was also patent without mass or lesion. The base of tongue was visualized and was normal. There were no signs of pooling of secretions in the piriform sinuses. The true vocal folds were mobile bilaterally. There were no signs of glottic or supraglottic mucosal lesion or mass. There was moderate interarytenoid pachydermia and post cricoid edema. The telescope was then slowly withdrawn and the patient tolerated the procedure throughout.    Studies Reviewed: CT chest 05/28/23   IMPRESSION: *No change in the fibrotic scar changes right  upper lobe anterior margin apex. *No change in the 3 mm right middle lobe nodule. *No change in the 4 mm right lower lobe nodule. *Previously described 6 mm nodule in the left upper lobe not clearly identified on today's images, poorly identified 2.5 mm fissural nodule is seen. *No other nodules. *No acute infiltrates or consolidations.  Assessment/Plan: Encounter Diagnoses  Name Primary?   Glottic insufficiency  Yes   Dysphonia    Hoarseness    Age-related vocal fold atrophy    Chronic sore throat    Decreased hearing of both ears    Tinnitus of both ears     Assessment and Plan Assessment & Plan Chronic dysphonia Sings at a church and noticed she does not sound the same any more a few months ago, and reports hoarseness, decrease in voice projection. She also reports mild throat discomfort with talking and singing. Flexible scope exam today with VF atrophy and glottic insufficiency as well as GERD LPR changes.  - Refer to voice therapy for breath support optimization. - Continue omeprazole for reflux management. - Advise dietary and lifestyle modifications to reduce reflux, including avoiding spicy foods, fried foods, and caffeinated beverages. - Recommend natural seaweed supplement post-meals, especially after dinner, to prevent nocturnal reflux.  GERD LPR - Continue omeprazole -  Reflux Gourmet after meals - diet and lifestyle changes to minimize GERD - Refer to [employer] blog for dietary and lifestyle modifications/reflux cook book  Hearing loss with tinnitus, gradually worse and bilateral  Bilateral hearing loss with tinnitus, likely age-related, with difficulty hearing initial consonants and buzzing noise in both ears. No recent hearing tests.  - Order hearing test to assess degree of hearing loss and type of hearing loss.   Thank you for allowing me to participate in the care of this patient. Please do not hesitate to contact me with any questions or concerns.   Elena Larry, MD Otolaryngology [facility] Phone: [phone] Fax: [phone]    12/08/2023, 10:55 AM

## 2023-12-08 NOTE — Patient Instructions (Signed)

## 2023-12-09 ENCOUNTER — Ambulatory Visit: Admitting: Skilled Nursing Facility1

## 2023-12-09 DIAGNOSIS — K080 Exfoliation of teeth due to systemic causes: Secondary | ICD-10-CM | POA: Diagnosis not present

## 2023-12-15 ENCOUNTER — Encounter: Attending: Physician Assistant | Admitting: Skilled Nursing Facility1

## 2023-12-15 VITALS — Wt 181.1 lb

## 2023-12-15 DIAGNOSIS — E119 Type 2 diabetes mellitus without complications: Secondary | ICD-10-CM | POA: Insufficient documentation

## 2023-12-15 NOTE — Progress Notes (Signed)
  ASSESSMENT  Patient states that she has been having problems with compulsive eating and eating more sugar.  She is not checking her blood glucose stating she needs other options besides a traditional glucometer.  Referral:  Type 2 diabetes  History includes Type 2 diabetes, HLD, HTN, breast cancer in remission. Lymphodema , hypothyroid   DM medications: Jardiance    Patient states she lives alone.She moved from [place]  from [place]  about 2021. 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 Cares for her grandson often throughout the week. Some issues with knee and wants to avoid a knee replacement and Shoulder issues which she states are feeling a little bit better since eating more nutrient dense foods. Took a class with a Charity fundraiser at the [employer] (nutrition and exercise)   (Previous appt) Pt states she has been having trouble controlling her snacking and has a habit of wanting a dessert with each meal. Pt states she baby sits her grandson often stating she considers that exercise. Pt states her daughter eats well but when she is at home she finds it more difficult. Pt states she has an eradic schedule due to choir practice and her grandchild duties. Pt states she goes to bed around 12am.  Pt states does struggle with emotional eating.  Pt states she is open to working with talk therapy.   Pt states she recognizes she snacks after dinner. Pt states she does not understand where some of her highs are coming from: CGM data reviewed with pt looking for patterns and how to identify them  Pt states she did get a CGM where she is seeing her sugars spike and get high not really understanding where from.   CGM Results from download:   % Time CGM active:   94 %   (Goal >70%)  Average glucose:   198 mg/dL for 14 days  Glucose management indicator:   8.0 %  Time in range (70-180 mg/dL):   57%   (Goal >29%)  Time High (181-250 mg/dL):   46 %   (Goal <  74%)  Time Very High (>250 mg/dL):    87%   (Goal < 5%)  Time Low (54-69 mg/dL):   0 %   (Goal <5%)  Time Very Low (<54 mg/dL):   0 %   (Goal <8%)  %CV (glucose variability)     %  (Goal <36%)    Breakfast:  English muffin, avocado and coffee + sugar free creamer and hard boiled egg then after a walk checked blood sugar about 30-40 minutes   Lunsh: tuna + 1 slice of bread and apple yogurt + blueberrs Snack:   Dinner: mythos: quinoa and chicken  Beverages: club soda, no sugar gingerale    Goals: Psychologist, forensic with your eating patterns throughout the day to avoid snacking beyond need Try the one nostril at a time breathing exercise when a strong craving comes around

## 2023-12-19 ENCOUNTER — Other Ambulatory Visit: Payer: Self-pay | Admitting: Physician Assistant

## 2024-01-01 ENCOUNTER — Other Ambulatory Visit: Payer: Self-pay | Admitting: Physician Assistant

## 2024-01-15 ENCOUNTER — Ambulatory Visit: Admitting: Skilled Nursing Facility1

## 2024-01-19 ENCOUNTER — Other Ambulatory Visit: Payer: Self-pay

## 2024-01-19 ENCOUNTER — Ambulatory Visit: Attending: Otolaryngology

## 2024-01-19 DIAGNOSIS — R490 Dysphonia: Secondary | ICD-10-CM | POA: Insufficient documentation

## 2024-01-19 DIAGNOSIS — J383 Other diseases of vocal cords: Secondary | ICD-10-CM | POA: Diagnosis not present

## 2024-01-19 NOTE — Patient Instructions (Addendum)
   Sing using a straw for 2 minutes before before you practice PHoRTE exercises   PHoRTE VOICE EXERCISES - DO TWICE EACH DAY  1) Hold AHHHHHHHHHHHHH 10 times   As long as you can as strong as you can - push from your abdominal muscles!   [email or web address]  2) Count 1-10 (skip 7)   Start at as low pitch as you can, glide to as high pitch as you can, then back down as low as you can   [email or web address]  3) Say 10 sentences -two different ways   (a) like you are calling over the fence to your neighbor in a higher-pitch voice  (b) like you are scolding your dog in a LOW authoritative voice   [email or web address]

## 2024-01-19 NOTE — Therapy (Signed)
 OUTPATIENT SPEECH LANGUAGE PATHOLOGY VOICE EVALUATION   Patient Name: Amanda Rollins MRN: [id number] DOB:14-Nov-1950, 73 y.o., female Today's Date: 01/19/2024  PCP: Job Lukes, MD REFERRING PROVIDER: Okey Burns, MD  END OF SESSION:  End of Session - 01/19/24 1742     Visit Number 1    Number of Visits 7    Date for Recertification  03/05/24   due to date of first ST   SLP Start Time 1020    SLP Stop Time  1101    SLP Time Calculation (min) 41 min    Activity Tolerance Patient tolerated treatment well          Past Medical History:  Diagnosis Date   Breast cancer (HCC) 2011   Diabetes mellitus type 2, controlled (HCC)    Heart murmur    Hyperlipidemia    Hypertension    Hypothyroidism    Past Surgical History:  Procedure Laterality Date   ABDOMINAL HYSTERECTOMY  2000   Still has ovaries   BELPHAROPTOSIS REPAIR  07/2019   BREAST SURGERY     CATARACT EXTRACTION  2019   Left eye 2019, Right eye 2020   CESAREAN SECTION  12/08/1990   MASTECTOMY  2011   Bilateral   ROBOTIC ASSISTED LAPAROSCOPIC VAGINAL HYSTERECTOMY WITH FIBROID REMOVAL  1991   Patient Active Problem List   Diagnosis Date Noted   History of breast cancer 04/10/2022   Osteoporosis 04/10/2022   Depression, major, single episode, mild 04/05/2020   Lymphedema 02/08/2020   Pulmonary nodules 02/08/2020   Atherosclerosis of abdominal aorta 02/08/2020   Hypothyroidism    Hypertension    Hyperlipidemia    Heart murmur    Diabetes mellitus type 2, controlled (HCC)    Breast cancer (HCC) 2011    Onset date: approx one year; script 12/08/23  REFERRING DIAG: J38.3 (ICD-10-CM) - Glottic insufficiency R49.0 (ICD-10-CM) - Dysphonia R49.0 (ICD-10-CM) - Hoarseness 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 I have had improvement in my singing voice since starting the reflux gel.  Pt accompanied by: self  PERTINENT HISTORY:   PAIN:  Are you having pain? No  FALLS: Has patient fallen in last 6 months? No LIVING ENVIRONMENT: Lives with: lives alone Lives in: House/apartment  PLOF:Level of assistance: Independent with ADLs, Independent with IADLs Employment: Retired  PATIENT GOALS: Improve singing voice  OBJECTIVE:  Note: Objective measures were completed at Evaluation unless otherwise noted.  DIAGNOSTIC FINDINGS:  [place] - 12/08/23 History of Present Illness Amanda Rollins is a 73 year old female with a history of breast cancer and lymphedema who presents with inconsistent voice and throat irritation.   She has experienced inconsistent voice quality while singing in the church choir over the past year, with her voice going 'in and out'. She has persistent throat irritation, described as a sore throat or discomfort, occurring about 95% of the time. No recent illness or overexertion related to singing or talking. She occasionally experiences pain with swallowing.   She has a history of gastroesophageal reflux disease (GERD) for which she takes omeprazole, which helps for the most part. She also takes Claritin, which provides some relief. 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 Indirect laryngoscopy view was incomplete. Thus it was recommended that they undergo a flexible fiberoptic laryngoscopy. All of the risks, benefits, and potential complications were reviewed with the patient preoperatively and verbal informed  consent was obtained.   Procedure: The patient was seated upright in the clinic. Topical lidocaine and Afrin were applied to the nasal cavity. After adequate anesthesia had occurred, I then proceeded to pass the flexible telescope into the nasal cavity. The nasal cavity was patent without rhinorrhea or polyp. The nasopharynx was also patent without mass or lesion. The base of tongue was visualized and was normal. There were no signs of pooling of secretions in the piriform sinuses. The true vocal folds were mobile bilaterally. There were no signs of glottic or supraglottic mucosal lesion or mass. There was moderate interarytenoid pachydermia and post cricoid edema. The telescope was then slowly withdrawn and the patient tolerated the procedure throughout.  Assessment & Plan Chronic dysphonia Sings at a church and noticed she does not sound the same any more a few months ago, and reports hoarseness, decrease in voice projection. She also reports mild throat discomfort with talking and singing. Flexible scope exam today with VF atrophy and glottic insufficiency as well as GERD LPR changes.  - Refer to voice therapy for breath support optimization. - Continue omeprazole for reflux management. - Advise dietary and lifestyle modifications to reduce reflux, including avoiding spicy foods, fried foods, and caffeinated beverages. - Recommend natural seaweed supplement post-meals, especially after dinner, to prevent nocturnal reflux.   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 No recent hearing tests.  - Order hearing test to assess degree of hearing loss and type  of hearing loss.  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 She reports improvement in her speaking and singing voice since initiation of Reflux Gourmet. Speaking voice is slightly louder, but not perceptually abnormally loud. Pt was using abdominal breathing today with speech.  OBJECTIVE VOICE ASSESSMENT: Maximum phonation time for sustained ah: 17.4 (WNL) Conversational pitch average: 171 Hz Conversational pitch range: 89-256 Hz Conversational loudness average: 72 dB Conversational loudness range: 55-78 dB S/z ratio: 1.037 (WNL) (Suggestive of dysfunction >1.0)  PATIENT REPORTED OUTCOME MEASURES (PROM): V-RQOL: or VHI to be provided in first session  TREATMENT DATE:   01/19/24:  After evaluation today, SLP introduced PhoRTE with pt today and provided rationale for HEP. Pt asked SLP what a slit in the vocal folds meant. SLP explained this to pt. SLP then guided pt through each PhoRTE exercise, with a model, and shaped pt's productions. She will cont with this BID.  PATIENT EDUCATION: Education details: see treatment date Person educated: Patient Education method: Explanation, Demonstration, Verbal cues, and Handouts Education comprehension: verbalized understanding, returned demonstration, verbal cues required, and needs further education  HOME EXERCISE  PROGRAM: PhoRTE  GOALS: Goals reviewed with patient? Yes, generally  SHORT TERM GOALS: Target date: 02/20/24  Pt will compete PhoRTE with mod I in two sessions Baseline: Goal status: INITIAL  2.  Pt will report improvement in speaking voice to 9/10 (10=WNL voice) in 2 sessions Baseline:  Goal status: INITIAL  3.  Pt will maintain AB 80% in 5 minutes conversation Baseline:  Goal status: INITIAL   LONG TERM GOALS: Target date: 03/05/24  Pt will improve PROM Baseline:  Goal status: INITIAL  2.  Pt will maintain AB 80% for 10 minutes conversation Baseline:  Goal status: INITIAL  3.  Pt will report improvement in speaking voice to 10/10 (10=WNL voice) in 2 sessions Baseline:  Goal status: INITIAL  4.  Pt will decr caffeine intake to < 1 mug/day Baseline:  Goal status: INITIAL   ASSESSMENT:  CLINICAL IMPRESSION: Patient is a 73 y.o. F who was seen today for assessment of voice in light of hoarseness (intermittent) in the last approx 12 months. Today she reports her voice is 8/10 speaking, 9/10 singing (10=WNL voice). She admits improvement with singing voice since starting alginate therapy. Today pt's breathing pattern was mostly abdominal, SLP to confirm this is maintained over 5 mintues and 10 minutes of conversation to maximize diaphragmatic control and use in voicing. Pt will assist pt in completion of PhoRTE voice exercises to decr/eliminate effects of vocal fold atrophy (pt reported Dr. Okey called this a slit in the vocal folds).  OBJECTIVE IMPAIRMENTS: include voice disorder. These impairments are limiting patient from household responsibilities, ADLs/IADLs, and effectively communicating at home and in community. Factors affecting potential to achieve goals and functional outcome are none noted today. Patient will benefit from skilled SLP services to address above impairments and improve overall function.  REHAB POTENTIAL: Excellent  PLAN:  SLP FREQUENCY:  1x/week  SLP DURATION: 6 weeks  PLANNED INTERVENTIONS: Functional tasks, SLP instruction and feedback, Compensatory strategies, Patient/family education, [phone] Treatment of speech (30 or 45 min) , and voice exercises    Simona Rocque, CCC-SLP 01/19/2024, 5:43 PM

## 2024-01-21 NOTE — Progress Notes (Signed)
 Amanda Rollins                                          MRN: [id number]   01/21/2024   The [facility] Quality Team Specialist reviewed this patient medical record for the purposes of chart review for care gap closure. The following were reviewed: chart review for care gap closure-glycemic status assessment and kidney health evaluation for diabetes:eGFR  and uACR.    [facility]I Quality Team

## 2024-01-22 ENCOUNTER — Other Ambulatory Visit: Payer: Self-pay | Admitting: Physician Assistant

## 2024-01-23 DIAGNOSIS — H524 Presbyopia: Secondary | ICD-10-CM | POA: Diagnosis not present

## 2024-01-23 LAB — OPHTHALMOLOGY REPORT-SCANNED

## 2024-01-26 ENCOUNTER — Ambulatory Visit: Attending: Otolaryngology

## 2024-01-26 DIAGNOSIS — R490 Dysphonia: Secondary | ICD-10-CM | POA: Diagnosis not present

## 2024-01-26 NOTE — Therapy (Signed)
 OUTPATIENT SPEECH LANGUAGE PATHOLOGY VOICE TREATMENT   Patient Name: Amanda Rollins MRN: [id number] DOB:Dec 01, 1950, 73 y.o., female Today's Date: 01/26/2024  PCP: Job Lukes, MD REFERRING PROVIDER: Okey Burns, MD  END OF SESSION:  End of Session - 01/26/24 0933     Visit Number 2    Number of Visits 7    Date for Recertification  03/05/24   due to date of first ST   SLP Start Time 0848    SLP Stop Time  0930    SLP Time Calculation (min) 42 min    Activity Tolerance Patient tolerated treatment well           Past Medical History:  Diagnosis Date   Breast cancer (HCC) 2011   Diabetes mellitus type 2, controlled (HCC)    Heart murmur    Hyperlipidemia    Hypertension    Hypothyroidism    Past Surgical History:  Procedure Laterality Date   ABDOMINAL HYSTERECTOMY  2000   Still has ovaries   BELPHAROPTOSIS REPAIR  07/2019   BREAST SURGERY     CATARACT EXTRACTION  2019   Left eye 2019, Right eye 2020   CESAREAN SECTION  12/08/1990   MASTECTOMY  2011   Bilateral   ROBOTIC ASSISTED LAPAROSCOPIC VAGINAL HYSTERECTOMY WITH FIBROID REMOVAL  1991   Patient Active Problem List   Diagnosis Date Noted   History of breast cancer 04/10/2022   Osteoporosis 04/10/2022   Depression, major, single episode, mild 04/05/2020   Lymphedema 02/08/2020   Pulmonary nodules 02/08/2020   Atherosclerosis of abdominal aorta 02/08/2020   Hypothyroidism    Hypertension    Hyperlipidemia    Heart murmur    Diabetes mellitus type 2, controlled (HCC)    Breast cancer (HCC) 2011    Onset date: approx one year; script 12/08/23  REFERRING DIAG: J38.3 (ICD-10-CM) - Glottic insufficiency R49.0 (ICD-10-CM) - Dysphonia R49.0 (ICD-10-CM) - Hoarseness 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 It could be the weather.  Pt accompanied by: self  PERTINENT HISTORY:   PAIN:  Are you having pain? No  FALLS: Has patient fallen in last 6 months? No  PATIENT GOALS: Improve singing voice  OBJECTIVE:  Note: Objective measures were completed at Evaluation unless otherwise noted.  DIAGNOSTIC FINDINGS:  [place] - 12/08/23 History of Present Illness [name] is a 73 year old female with a history of breast cancer and lymphedema who presents with inconsistent voice and throat irritation.   She has experienced inconsistent voice quality while singing in the church choir over the past year, with her voice going 'in and out'. She has persistent throat irritation, described as a sore throat or discomfort, occurring about 95% of the time. No recent illness or overexertion related to singing or talking. She occasionally experiences pain with swallowing.   She has a history of gastroesophageal reflux disease (GERD) for which she takes omeprazole, which helps for the most part. She also takes Claritin, which provides some relief. 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  Indirect laryngoscopy view was incomplete. Thus it was recommended that they undergo a flexible fiberoptic laryngoscopy. All of the risks, benefits, and potential complications were reviewed with the patient preoperatively and verbal informed consent was obtained.   Procedure: The patient was seated upright in the clinic. Topical lidocaine and Afrin were applied to the nasal cavity. After adequate anesthesia had occurred, I then proceeded to pass the flexible  telescope into the nasal cavity. The nasal cavity was patent without rhinorrhea or polyp. The nasopharynx was also patent without mass or lesion. The base of tongue was visualized and was normal. There were no signs of pooling of secretions in the piriform sinuses. The true vocal folds were mobile bilaterally. There were no signs of glottic or supraglottic mucosal lesion or mass. There was moderate interarytenoid pachydermia and post cricoid edema. The telescope was then slowly withdrawn and the patient tolerated the procedure throughout.  Assessment & Plan Chronic dysphonia Sings at a church and noticed she does not sound the same any more a few months ago, and reports hoarseness, decrease in voice projection. She also reports mild throat discomfort with talking and singing. Flexible scope exam today with VF atrophy and glottic insufficiency as well as GERD LPR changes.  - Refer to voice therapy for breath support optimization. - Continue omeprazole for reflux management. - Advise dietary and lifestyle modifications to reduce reflux, including avoiding spicy foods, fried foods, and caffeinated beverages. - Recommend natural seaweed supplement post-meals, especially after dinner, to prevent nocturnal reflux.   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 No recent hearing tests.  - Order hearing test to assess degree of hearing loss and type of hearing loss.   PATIENT REPORTED OUTCOME MEASURES (PROM): V-RQOL: will be provided in pt's next visit                                                                                                                             TREATMENT DATE:   01/26/24: Pt needs V-RQOL next session. She cont to use Reflux Gourmet. Pam rated her voice today 6-7/10 but as the day goes on it will get better. She entered clearing throat. SLP provided water. Pt cont to clear throat even after SLP encouragement not to do so. SLP provided some throat clearing alternatives. After these pt used predominantly very gentle throat clearing. Total number of clears today: 27 (last 12 were very gentle). PhoRTE exercises were reviewed. Sustained /a/ for 9 seconds with average 85dB. Mod cues necessary for correct procedure today with numbers but pt was independent by task end. Average 85dB. With sentences, pt was independent with procedure: average high pitched sentences  86dB and low pitched average 86dB. Pt spoke with AB 85% today throughout session.  01/19/24:  After evaluation today, SLP introduced PhoRTE with pt today and provided rationale for HEP. Pt asked SLP what a slit in the vocal folds meant. SLP explained this to pt. SLP then guided pt through each PhoRTE exercise, with a model, and shaped pt's productions. She will cont with this BID.  PATIENT EDUCATION: Education details: see treatment date Person educated: Patient Education method: Explanation, Demonstration, Verbal cues, and Handouts Education comprehension: verbalized understanding, returned demonstration, verbal cues required, and needs further education  HOME EXERCISE PROGRAM: PhoRTE  GOALS: Goals reviewed with patient? Yes, generally  SHORT TERM GOALS: Target date: 02/20/24  Pt will compete PhoRTE with mod I in two sessions Baseline: Goal status: INITIAL  2.  Pt will report improvement in speaking voice to 9/10 (10=WNL voice) in 2 sessions Baseline:  Goal status: INITIAL  3.  Pt will maintain AB 80% in 5 minutes conversation Baseline:  Goal status: met   LONG TERM GOALS: Target date: 03/05/24  Pt will improve PROM Baseline:  Goal status:  INITIAL  2.  Pt will maintain AB 80% for 10 minutes conversation Baseline:  Goal status: INITIAL  3.  Pt will report improvement in speaking voice to 10/10 (10=WNL voice) in 2 sessions Baseline:  Goal status: INITIAL  4.  Pt will decr caffeine intake to < 1 mug/day Baseline:  Goal status: INITIAL   ASSESSMENT:  CLINICAL IMPRESSION: Patient is a 73 y.o. F who was seen today for treatment of voice in light of hoarseness (intermittent) in the last approx 12 months. Today she reports her voice is 6-7/10 speaking (10=WNL voice).  Pt cont to require assistance in completion of PhoRTE voice exercises to decr/eliminate effects of vocal fold atrophy (pt reported Dr. Okey called this a slit in the vocal folds).  OBJECTIVE IMPAIRMENTS: include voice disorder. These impairments are limiting patient from household responsibilities, ADLs/IADLs, and effectively communicating at home and in community. Factors affecting potential to achieve goals and functional outcome are none noted today. Patient will benefit from skilled SLP services to address above impairments and improve overall function.  REHAB POTENTIAL: Excellent  PLAN:  SLP FREQUENCY: 1x/week  SLP DURATION: 6 weeks  PLANNED INTERVENTIONS: Functional tasks, SLP instruction and feedback, Compensatory strategies, Patient/family education, [phone] Treatment of speech (30 or 45 min) , and voice exercises    Harpreet Pompey, CCC-SLP 01/26/2024, 9:33 AM

## 2024-01-26 NOTE — Patient Instructions (Signed)

## 2024-02-02 ENCOUNTER — Ambulatory Visit

## 2024-02-04 ENCOUNTER — Other Ambulatory Visit: Payer: Self-pay | Admitting: Physician Assistant

## 2024-02-05 NOTE — Progress Notes (Signed)
 Amanda Rollins                                          MRN: [id number]   02/05/2024   The [facility] Quality Team Specialist reviewed this patient medical record for the purposes of chart review for care gap closure. The following were reviewed: chart review for care gap closure-controlling blood pressure and glycemic status assessment. Gsd - 10.6, BP -151/75    [facility]I Quality Team

## 2024-02-09 DIAGNOSIS — K432 Incisional hernia without obstruction or gangrene: Secondary | ICD-10-CM | POA: Diagnosis not present

## 2024-02-09 DIAGNOSIS — K429 Umbilical hernia without obstruction or gangrene: Secondary | ICD-10-CM | POA: Diagnosis not present

## 2024-02-09 NOTE — Progress Notes (Signed)
 REFERRING PHYSICIAN:  Self  SURGEON:  CORDELLA BEVERLEY IDLER, MD  MRN: [id number] DOB: 1950/07/28 DATE OF ENCOUNTER: 02/09/2024  Subjective   Chief Complaint: Follow-up     History of Present Illness:  Amanda Rollins is a 73 y.o. female who is seen today as an office consultation at the request of Dr. Arch for evaluation of Follow-up  Interval History Overall, she is doing well. She reports minimal discomfort related to her hernias but is mostly annoyed by the asymmetry. She has started on new medications for her diabetes and has lab work scheduled for November.   History of Present Illness A 73 year old patient with a past medical history of breast cancer, type 2 diabetes, lymphedema of the RUE, hypertension, and hypercholesterolemia presents for evaluation of a possible ventral hernia. The patient was diagnosed with a hernia in the lower left quadrant in 2017, which was not causing discomfort at the time. However, over the past few months, the patient has noticed increased discomfort and believes the bulge has grown slightly larger. The patient denies any acute pain, skin changes, or other symptoms that might suggest an emergent hernia complication. The patient has a history of multiple abdominal surgeries, including fibroid surgery, a C-section, and a hysterectomy, which may contribute to the development of hernias. The patient's type 2 diabetes is currently not well-controlled, with a recent A1c of 10.7, and she is working on improving her blood glucose control.  CT scan from 2017 showed a ventral hernia containing colon. She had a repeat CT in 2025 that was read as having a laxity without definitive hernia.   Review of Systems: A complete review of systems was obtained from the patient.  I have reviewed this information and discussed as appropriate with the patient.  See HPI as well for other ROS.  Review of Systems  Constitutional: Negative.   HENT: Negative.    Eyes: Negative.    Respiratory: Negative.    Cardiovascular: Negative.   Gastrointestinal: Negative.   Genitourinary: Negative.   Musculoskeletal: Negative.   Skin: Negative.   Neurological: Negative.   Endo/Heme/Allergies: Negative.   Psychiatric/Behavioral: Negative.        Vitals:   02/09/24 1006  PainSc: 0-No pain    There is no height or weight on file to calculate BMI.  Allergies  Allergen Reactions  . Metformin  Diarrhea    Severe    Current Outpatient Medications on File Prior to Visit  Medication Sig Dispense Refill  . alendronate  (FOSAMAX ) 70 MG tablet TAKE 1 TABLET(70 MG) BY MOUTH 1 TIME A WEEK WITH A FULL GLASS OF WATER AND ON AN EMPTY STOMACH    . empagliflozin  (JARDIANCE ) 25 mg tablet Take 1 tablet by mouth once daily    . gabapentin  (NEURONTIN ) 300 MG capsule Take 300 mg by mouth at bedtime    . levothyroxine  (SYNTHROID ) 50 MCG tablet TAKE 1 TABLET(50 MCG) BY MOUTH DAILY    . losartan -hydroCHLOROthiazide (HYZAAR) 100-12.5 mg tablet Take 1 tablet by mouth once daily    . metoprolol  SUCCinate (TOPROL -XL) 25 MG XL tablet Take 25 mg by mouth once daily    . omeprazole (PRILOSEC) 10 MG DR capsule Take 10 mg by mouth once daily    . rosuvastatin  (CRESTOR ) 5 MG tablet Take 5 mg by mouth once daily     No current facility-administered medications on file prior to visit.    There is no problem list on file for this patient.   Past Medical History:  Diagnosis  Date  . Breast cancer (CMS/HHS-HCC) 2011  . Diabetes mellitus without complication (CMS/HHS-HCC)   . Hyperlipidemia   . Hypertension   . Thyroid  disease     Past Surgical History:  Procedure Laterality Date  . Belpharoptosis repair  07/2019  . CATARACT EXTRACTION    . HYSTERECTOMY    . MASTECTOMY Bilateral      Family History  Adopted: Yes  Problem Relation Age of Onset  . Other (Small cell lung ca) Mother      Social History   Tobacco Use  Smoking Status Former  . Types: Cigarettes  Smokeless Tobacco Not  on file     Social History   Socioeconomic History  . Marital status: Widowed  Tobacco Use  . Smoking status: Former    Types: Cigarettes  Vaping Use  . Vaping status: Unknown  Substance and Sexual Activity  . Alcohol use: Yes  . Drug use: Never   Social Drivers of Corporate investment banker Strain: Low Risk  (11/24/2023)   Received from [facility]   Overall Financial Resource Strain (CARDIA)   . How hard is it for you to pay for the very basics like food, housing, medical care, and heating?: Not hard at all  Food Insecurity: No Food Insecurity (11/24/2023)   Received from [facility]   Hunger Vital Sign   . Within the past 12 months, you worried that your food would run out before you got the money to buy more.: Never true   . Within the past 12 months, the food you bought just didn't last and you didn't have money to get more.: Never true  Transportation Needs: No Transportation Needs (11/24/2023)   Received from [facility] - Transportation   . In the past 12 months, has lack of transportation kept you from medical appointments or from getting medications?: No   . In the past 12 months, has lack of transportation kept you from meetings, work, or from getting things needed for daily living?: No  Physical Activity: Insufficiently Active (11/24/2023)   Received from [facility]   Exercise Vital Sign   . On average, how many days per week do you engage in moderate to strenuous exercise (like a brisk walk)?: 4 days   . On average, how many minutes do you engage in exercise at this level?: 20 min  Stress: No Stress Concern Present (11/24/2023)   Received from [facility] - Occupational Stress Questionnaire   . Do you feel stress - tense, restless, nervous, or anxious, or unable to sleep at night because your mind is troubled all the time - these days?: Not at all  Social Connections: Moderately Integrated (11/24/2023)   Received from  [facility]   Social Connection and Isolation Panel   . In a typical week, how many times do you talk on the phone with family, friends, or neighbors?: More than three times a week   . How often do you get together with friends or relatives?: More than three times a week   . How often do you attend church or religious services?: More than 4 times per year   . Do you belong to any clubs or organizations such as church groups, unions, fraternal or athletic groups, or school groups?: Yes   . How often do you attend meetings of the clubs or organizations you belong to?: 1 to 4 times per year   . Are  you married, widowed, divorced, separated, never married, or living with a partner?: Widowed  Housing Stability: Unknown (06/13/2023)   Housing Stability Vital Sign   . Homeless in the Last Year: No    Objective:   Physical Exam   Gen: female, NAD Abd: soft, non-distended, well healed lower abdominal incision.  No obvious hernia defect appreciated but exam is limited by her habitus  Labs, Imaging and Diagnostic Testing:   Procedure Note:    Assessment and Plan:   Assessment & Plan Umbilical hernia - I offered her robotic repair of her umbilical hernia. She does not notice a bulge and only has minimal pain. She would like to defer for now.  ?Ventral hernia - She has a CT from 2017 that was read as showing a hernia, however, the CT from 2025 showed more of a laxity. With her previous surgeries, I would expect that she likely has a hernia in the area that may be swiss-cheese type that is not readily apparent on CT. I offered to do a diagnostic lap at the time of umbilical hernia repair. If I identify a hernia at that time then I would proceed with an IPOM repair. She would like to continue with conservative management for now. She will call if she desires to schedule surgery.   GREGORY BEVERLEY IDLER, MD   Evaluation and management of this patient required review of outside notes, review of  outside labs, an assessment with an independent historian, as well as personal review of her CT scan.

## 2024-02-13 ENCOUNTER — Ambulatory Visit

## 2024-02-13 DIAGNOSIS — R490 Dysphonia: Secondary | ICD-10-CM

## 2024-02-13 NOTE — Therapy (Signed)
 OUTPATIENT SPEECH LANGUAGE PATHOLOGY VOICE TREATMENT   Patient Name: Amanda Rollins MRN: [id number] DOB:[birth year], 73 y.o., female Today's Date: 02/13/2024  PCP: Amanda Lukes, MD REFERRING PROVIDER: Okey Burns, MD  END OF SESSION:  End of Session - 02/13/24 1126     Visit Number 3    Number of Visits 7    Date for Recertification  03/05/24   due to date of first ST   SLP Start Time 0933    SLP Stop Time  1012    SLP Time Calculation (min) 39 min    Activity Tolerance Patient tolerated treatment well            Past Medical History:  Diagnosis Date   Breast cancer (HCC) 2011   Diabetes mellitus type 2, controlled (HCC)    Heart murmur    Hyperlipidemia    Hypertension    Hypothyroidism    Past Surgical History:  Procedure Laterality Date   ABDOMINAL HYSTERECTOMY  2000   Still has ovaries   BELPHAROPTOSIS REPAIR  07/2019   BREAST SURGERY     CATARACT EXTRACTION  2019   Left eye 2019, Right eye 2020   CESAREAN SECTION  12/08/1990   MASTECTOMY  2011   Bilateral   ROBOTIC ASSISTED LAPAROSCOPIC VAGINAL HYSTERECTOMY WITH FIBROID REMOVAL  1991   Patient Active Problem List   Diagnosis Date Noted   History of breast cancer 04/10/2022   Osteoporosis 04/10/2022   Depression, major, single episode, mild 04/05/2020   Lymphedema 02/08/2020   Pulmonary nodules 02/08/2020   Atherosclerosis of abdominal aorta 02/08/2020   Hypothyroidism    Hypertension    Hyperlipidemia    Heart murmur    Diabetes mellitus type 2, controlled (HCC)    Breast cancer (HCC) 2011    Onset date: approx one year; script 12/08/23  REFERRING DIAG: 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  Pt accompanied by: self  PERTINENT HISTORY:   PAIN:  Are you having pain? No  FALLS: Has patient fallen in last 6 months? No  PATIENT GOALS: Improve singing voice  OBJECTIVE:  Note: Objective measures were completed at Evaluation unless otherwise noted.  DIAGNOSTIC FINDINGS:  [place] - 12/08/23 History of Present Illness Amanda Rollins is a 73 year old female with a history of breast cancer and lymphedema who presents with inconsistent voice and throat irritation.   She has experienced inconsistent voice quality while singing in the church choir over the past year, with her voice going 'in and out'. She has persistent throat irritation, described as a sore throat or discomfort, occurring about 95% of the time. No recent illness or overexertion related to singing or talking. She occasionally experiences pain with swallowing.   She has a history of gastroesophageal reflux disease (GERD) for which she takes omeprazole, which helps for the most part. She also takes Claritin, which provides some relief. 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  Indirect laryngoscopy view was incomplete. Thus it was recommended that they undergo a flexible fiberoptic laryngoscopy. All of the risks, benefits, and potential complications were reviewed with the patient preoperatively and verbal informed consent was obtained.   Procedure: The patient was seated upright in the clinic. Topical lidocaine and Afrin were applied to the nasal cavity. After adequate anesthesia had occurred, I then proceeded to pass the flexible telescope into  the nasal cavity. The nasal cavity was patent without rhinorrhea or polyp. The nasopharynx was also patent without mass or lesion. The base of tongue was visualized and was normal. There were no signs of pooling of secretions in the piriform sinuses. The true vocal folds were mobile bilaterally. There were no signs of glottic or supraglottic mucosal lesion or mass. There was moderate interarytenoid pachydermia and post cricoid edema. The telescope was then slowly withdrawn and the patient tolerated the procedure throughout.  Assessment & Plan Chronic dysphonia Sings at a church and noticed she does not sound the same any more a few months ago, and reports hoarseness, decrease in voice projection. She also reports mild throat discomfort with talking and singing. Flexible scope exam today with VF atrophy and glottic insufficiency as well as GERD LPR changes.  - Refer to voice therapy for breath support optimization. - Continue omeprazole for reflux management. - Advise dietary and lifestyle modifications to reduce reflux, including avoiding spicy foods, fried foods, and caffeinated beverages. - Recommend natural seaweed supplement post-meals, especially after dinner, to prevent nocturnal reflux.   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 No recent hearing tests.  - Order hearing test to assess degree of hearing loss and type of hearing loss.   PATIENT REPORTED OUTCOME MEASURES (PROM): V-RQOL: will be provided in pt's next visit                                                                                                                             TREATMENT DATE:   02/13/24: Pt rated voice 7/10 today, tells SLP she thinks her quality of voice is generally better - attributes (yes, maybe) some to PhoRTE. AB noted in conversation 80%. Pam cleared throat 17 times today in this session; SLP provided water for her and she used this at times as an alternative to throat clearing. She led a portion of choir practice last night. SLP led pt through PhoRTE and pt completed with rare min A. SLP and pt agreed she could be seen every other week. She cont to use Reflux Gourmet with success.   01/26/24: Pt needs V-RQOL next session. She cont to use Reflux Gourmet. Pam rated her voice today 6-7/10  but as the day goes on it will get better. She entered clearing throat. SLP provided water. Pt cont to clear throat even after SLP encouragement not to do so. SLP provided some throat clearing alternatives. After these pt used predominantly very gentle throat clearing. Total number of clears today: 27 (last 12 were very gentle). PhoRTE exercises were reviewed. Sustained /a/ for 9 seconds with average 85dB. Mod cues necessary for correct procedure today with numbers but pt was independent by task end. Average 85dB. With sentences, pt was independent with procedure: average high pitched sentences 86dB and low pitched average 86dB. Pt spoke with AB 85% today throughout session.  01/19/24:  After evaluation today, SLP introduced PhoRTE with pt today and provided rationale for HEP. Pt asked SLP what a slit in the vocal folds meant. SLP explained this to pt. SLP then guided pt through each PhoRTE exercise, with a model, and shaped pt's productions. She will cont with this BID.  PATIENT EDUCATION: Education details: see treatment date Person educated: Patient Education method: Explanation, Demonstration, Verbal cues, and Handouts Education comprehension: verbalized understanding, returned demonstration, verbal cues required, and needs further education  HOME EXERCISE  PROGRAM: PhoRTE  GOALS: Goals reviewed with patient? Yes, generally  SHORT TERM GOALS: Target date: 02/20/24  Pt will compete PhoRTE with mod I in two sessions Baseline:02/13/24 Goal status: INITIAL  2.  Pt will report improvement in speaking voice to 9/10 (10=WNL voice) in 2 sessions Baseline:  Goal status: INITIAL  3.  Pt will maintain AB 80% in 5 minutes conversation Baseline:  Goal status: met   LONG TERM GOALS: Target date: 03/05/24  Pt will improve PROM Baseline:  Goal status: INITIAL  2.  Pt will maintain AB 80% for 10 minutes conversation Baseline:  Goal status: INITIAL  3.  Pt will report improvement in speaking voice to 10/10 (10=WNL voice) in 2 sessions Baseline:  Goal status: INITIAL  4.  Pt will decr caffeine intake to < 1 mug/day Baseline:  Goal status: INITIAL   ASSESSMENT:  CLINICAL IMPRESSION: Patient is a 73 y.o. F who was seen today for treatment of voice in light of hoarseness (intermittent) in the last approx 12 months. Today she reports her voice is 7/10 speaking (10=WNL voice).  Pt completed PhoRTE voice exercises to decr/eliminate effects of vocal fold atrophy (pt reported Dr. Okey called this a slit in the vocal folds) independently today and agreed that she could decr to once every two weeks.  OBJECTIVE IMPAIRMENTS: include voice disorder. These impairments are limiting patient from household responsibilities, ADLs/IADLs, and effectively communicating at home and in community. Factors affecting potential to achieve goals and functional outcome are none noted today. Patient will benefit from skilled SLP services to address above impairments and improve overall function.  REHAB POTENTIAL: Excellent  PLAN:  SLP FREQUENCY: every other week  SLP DURATION: 6 weeks  PLANNED INTERVENTIONS: Functional tasks, SLP instruction and feedback, Compensatory strategies, Patient/family education, [phone] Treatment of speech (30 or 45 min) , and  voice exercises    Hadiya Spoerl, CCC-SLP 02/13/2024, 11:27 AM

## 2024-02-14 ENCOUNTER — Other Ambulatory Visit: Payer: Self-pay | Admitting: Physician Assistant

## 2024-02-19 ENCOUNTER — Ambulatory Visit

## 2024-02-25 ENCOUNTER — Ambulatory Visit

## 2024-03-01 ENCOUNTER — Encounter

## 2024-03-07 ENCOUNTER — Other Ambulatory Visit: Payer: Self-pay | Admitting: Cardiology

## 2024-03-07 DIAGNOSIS — R002 Palpitations: Secondary | ICD-10-CM

## 2024-03-07 DIAGNOSIS — I4729 Other ventricular tachycardia: Secondary | ICD-10-CM

## 2024-03-09 ENCOUNTER — Ambulatory Visit: Attending: Otolaryngology

## 2024-03-09 DIAGNOSIS — K080 Exfoliation of teeth due to systemic causes: Secondary | ICD-10-CM | POA: Diagnosis not present

## 2024-03-09 DIAGNOSIS — R490 Dysphonia: Secondary | ICD-10-CM | POA: Insufficient documentation

## 2024-03-09 NOTE — Therapy (Signed)
 OUTPATIENT SPEECH LANGUAGE PATHOLOGY VOICE TREATMENT/RECERTIFICATION   Patient Name: Amanda Rollins MRN: [id number] DOB:1950/09/30, 73 y.o., female Today's Date: 03/09/2024  PCP: Job Lukes, MD REFERRING PROVIDER: Okey Burns, MD (Documentation to Dr. Penne Croak)  END OF SESSION:  End of Session - 03/09/24 0926     Visit Number 4    Number of Visits 7    Date for Recertification  06/07/24    SLP Start Time 0849    SLP Stop Time  0926    SLP Time Calculation (min) 37 min    Activity Tolerance Patient tolerated treatment well            Past Medical History:  Diagnosis Date   Breast cancer (HCC) 2011   Diabetes mellitus type 2, controlled (HCC)    Heart murmur    Hyperlipidemia    Hypertension    Hypothyroidism    Past Surgical History:  Procedure Laterality Date   ABDOMINAL HYSTERECTOMY  2000   Still has ovaries   BELPHAROPTOSIS REPAIR  07/2019   BREAST SURGERY     CATARACT EXTRACTION  2019   Left eye 2019, Right eye 2020   CESAREAN SECTION  12/08/1990   MASTECTOMY  2011   Bilateral   ROBOTIC ASSISTED LAPAROSCOPIC VAGINAL HYSTERECTOMY WITH FIBROID REMOVAL  1991   Patient Active Problem List   Diagnosis Date Noted   History of breast cancer 04/10/2022   Osteoporosis 04/10/2022   Depression, major, single episode, mild 04/05/2020   Lymphedema 02/08/2020   Pulmonary nodules 02/08/2020   Atherosclerosis of abdominal aorta 02/08/2020   Hypothyroidism    Hypertension    Hyperlipidemia    Heart murmur    Diabetes mellitus type 2, controlled (HCC)    Breast cancer (HCC) 2011    Speech Therapy Progress Note  Dates of Reporting Period: 01/19/24 to present  Subjective Statement: Amanda Rollins has been seen for 4 sessions of voice therapy since 01/19/24.   Objective: She has been completing PhoRTE voice exercises to improve vocal fold mass to negate the effects of vocal fold atrophy. She has completed exercises at a suboptimal frequency (once a day  when BID was recommended). She continues to clear her throat excessively during sessions however states that mornings are worse for this. SLP cont to suggest alternatives for this. She uses alginate therapy regularly.  Goal Update: see below.   Plan: see pt once a month, x2-3 more sessions  Reason Skilled Services are Required: Pt has not mastered procedure for PhoRTE, cont to throat clear.   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 Pt thinks that part of the reason for her sinus drainage (and thus throat clearing) is due to change in weather.  Pt accompanied by: self  PERTINENT HISTORY:   PAIN:  Are you having pain? No  FALLS: Has patient fallen in last 6 months? No  PATIENT GOALS: Improve singing voice  OBJECTIVE:  Note: Objective measures were completed at Evaluation unless otherwise noted.  DIAGNOSTIC FINDINGS:  [place] - 12/08/23 History of Present Illness Nikaela Coyne is a 73 year old female with a history of breast cancer and lymphedema who presents with inconsistent voice and throat irritation.   She has  experienced inconsistent voice quality while singing in the church choir over the past year, with her voice going 'in and out'. She has persistent throat irritation, described as a sore throat or discomfort, occurring about 95% of the time. No recent illness or overexertion related to singing or talking. She occasionally experiences pain with swallowing.   She has a history of gastroesophageal reflux disease (GERD) for which she takes omeprazole, which helps for the most part. She also takes Claritin, which provides  some relief. 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 Indirect laryngoscopy view was incomplete. Thus it was recommended that they undergo a flexible fiberoptic laryngoscopy. All of the risks, benefits, and potential complications were reviewed with the patient preoperatively and verbal informed consent was obtained.   Procedure: The patient was seated upright in the clinic. Topical lidocaine and Afrin were applied to the nasal cavity. After adequate anesthesia had occurred, I then proceeded to pass the flexible telescope into the nasal cavity. The nasal cavity was patent without rhinorrhea or polyp. The nasopharynx was also patent without mass or lesion. The base of tongue was visualized and was normal. There were no signs of pooling of secretions in the piriform sinuses. The true vocal folds were mobile bilaterally. There were no signs of glottic or supraglottic mucosal lesion or mass. There was moderate interarytenoid pachydermia and post cricoid edema. The telescope was then slowly withdrawn and the patient tolerated the procedure throughout.  Assessment & Plan Chronic dysphonia Sings at a church and noticed she does not sound the same any more a few months ago, and reports hoarseness, decrease in voice projection. She also reports mild throat discomfort with talking and singing. Flexible scope exam today with VF atrophy and glottic insufficiency as well as GERD LPR changes.  - Refer to voice therapy for breath support optimization. - Continue omeprazole for reflux management. - Advise dietary and lifestyle modifications to reduce reflux, including avoiding spicy  foods, fried foods, and caffeinated beverages. - Recommend natural seaweed supplement post-meals, especially after dinner, to prevent nocturnal reflux.   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 No recent hearing tests.  - Order hearing test to assess degree of hearing loss and type of hearing loss.   PATIENT REPORTED OUTCOME MEASURES (PROM): V-RQOL: was administered 03/09/24, with pt scoring raw score 22/50 and V-RQOL score of 70 (good). She indicated 1 (not a problem) for avoiding going out socially, repeating herself, or becoming less outgoing due to her voice. 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 She rated her voice very good in the last two weeks (choices of poor, fair, good, very good, excellent)  TREATMENT DATE:   03/09/24: V-RQOL provided today with above results. Pt rates speaking voice 7/10 today. Continues to use alginate therapy daily. Thinking about switching to low acid coffee. SLP reiterated negative effects of caffeine on the voice and that goal was for a caffeine equivalent of less than one mug/day.  Amanda Rollins does PhoRTE once, in the car, daily (suboptimal frequency), at least 5 days/week. SLP encouraged pt to complete BID at least 5 days/week for maximum benefit. Throat cleared 14 times today - pt told  SLP she has more sinus drainage in the mornings and will not be throat clearing in 1-2 hours. After 4 throat clears in 2 minutes SLP reminded pt of the phonotraumatic nature of throat clearing and that alternatives would be best, demonstrated 2-3 she could try, and got pt a cup of water. Her frequency of throat clearing decreased when she had water. When pt finished her water frequency increased again. SLP encouraged pt to have water throughout the day and pt stated she does have water throughout the day. Pt maintained AB for the session. With PhoRTE, pt req'd cues for maintaining volume throughout the numbers productions as she was increasing volume AND pitch instead of a continuous louder volume across the glide, for producint a high pitch with high pitched sentences and low pitch for low-pitched sentences. Pt would like to return once a month. SLP reiterated the need to complete PhoRTE at recommended frequency with proper procedure, to cont to think about decr'ing frequency of throat clearing, and keeping water with her to accomplish this.   02/13/24: Pt rated voice 7/10 today, tells SLP she thinks her quality of voice is generally better - attributes (yes, maybe) some to PhoRTE. AB noted in conversation 80%. Amanda Rollins cleared throat 17 times today in this session; SLP provided water for her and she used this at times as an alternative to throat clearing. She led a portion of choir practice last night. SLP led pt through PhoRTE and pt completed with rare min A. SLP and pt agreed she could be seen every other week. She cont to use Reflux Gourmet with success.   01/26/24: Pt needs V-RQOL next session. She cont to use Reflux Gourmet. Amanda Rollins rated her voice today 6-7/10 but as the day goes on it will get better. She entered clearing throat. SLP provided water. Pt cont to clear throat even after SLP encouragement not to do so. SLP provided some throat clearing alternatives. After these pt used predominantly very gentle  throat clearing. Total number of clears today: 27 (last 12 were very gentle). PhoRTE exercises were reviewed. Sustained /a/ for 9 seconds with average 85dB. Mod cues necessary for correct procedure today with numbers but pt was independent by task end. Average 85dB. With sentences, pt was independent with procedure: average high pitched sentences 86dB and low pitched average 86dB. Pt spoke with AB 85% today throughout session.  01/19/24:  After evaluation today, SLP introduced PhoRTE with pt today and provided rationale for HEP. Pt asked SLP what a slit in the vocal folds meant. SLP explained this to pt. SLP then guided pt through each PhoRTE exercise, with a model, and shaped pt's productions. She will cont with this BID.  PATIENT EDUCATION: Education details: see treatment date Person educated: Patient Education method: Explanation, Demonstration, Verbal cues, and Handouts Education comprehension: verbalized understanding, returned demonstration, verbal cues required, and needs further education  HOME EXERCISE PROGRAM: PhoRTE  GOALS: Goals reviewed with patient? Yes, generally  SHORT TERM GOALS: Target  date: 02/20/24  Pt will compete PhoRTE with mod I in two sessions Baseline:02/13/24 Goal status: paritally met  2.  Pt will report improvement in speaking voice to 9/10 (10=WNL voice) in 2 sessions Baseline:  Goal status: not met  3.  Pt will maintain AB 80% in 5 minutes conversation Baseline:  Goal status: met   LONG TERM GOALS: Target date:  06/07/24  Pt will improve PROM Baseline:  Goal status: taken during last session  2.  Pt will maintain AB 80% for 10 minutes conversation Baseline:  Goal status: met  3.  Pt will report improvement in speaking voice to 10/10 (10=WNL voice) in 2 sessions Baseline:  Goal status: not met and cont  4.  Pt will decr caffeine intake to < 1 mug/day Baseline:  Goal status: not met and cont  5.   Pt will compete PhoRTE at least twice  a day, 4 days a week Baseline:  Goal status: INITIAL  6.  Pt will compete PhoRTE indpendently Baseline:  Goal status: INITIAL  ASSESSMENT:  CLINICAL IMPRESSION: Patient is a 73 y.o. F who was seen today for treatment of voice in light of hoarseness (intermittent) in the last approx 12 months. Today she cont to report her voice is 7/10 speaking (10=WNL voice).  She cleared her throat 14 times in this session, despite SLP encouragement to use alternatives, which she did after receiving water. Pt completed PhoRTE voice exercises to decr/eliminate effects of vocal fold atrophy (pt reported Dr. Okey called this a slit in the vocal folds)  today however she req'd cues from SLP. She desired to decr frequency to once every four weeks.  OBJECTIVE IMPAIRMENTS: include voice disorder. These impairments are limiting patient from household responsibilities, ADLs/IADLs, and effectively communicating at home and in community. Factors affecting potential to achieve goals and functional outcome are cooperation/participation level. Patient will benefit from skilled SLP services to address above impairments and improve overall function.  REHAB POTENTIAL: Good  PLAN:  SLP FREQUENCY: 1x/month  SLP DURATION: up to 3  sessions  PLANNED INTERVENTIONS: Functional tasks, SLP instruction and feedback, Compensatory strategies, Patient/family education, [phone] Treatment of speech (30 or 45 min) , and voice exercises    Nehemyah Foushee, CCC-SLP 03/09/2024, 9:51 AM

## 2024-03-10 ENCOUNTER — Other Ambulatory Visit: Payer: Self-pay | Admitting: Physician Assistant

## 2024-03-12 ENCOUNTER — Ambulatory Visit (INDEPENDENT_AMBULATORY_CARE_PROVIDER_SITE_OTHER): Admitting: Otolaryngology

## 2024-03-12 ENCOUNTER — Ambulatory Visit (INDEPENDENT_AMBULATORY_CARE_PROVIDER_SITE_OTHER): Admitting: Audiology

## 2024-03-22 ENCOUNTER — Ambulatory Visit (INDEPENDENT_AMBULATORY_CARE_PROVIDER_SITE_OTHER): Admitting: Otolaryngology

## 2024-03-22 ENCOUNTER — Ambulatory Visit (INDEPENDENT_AMBULATORY_CARE_PROVIDER_SITE_OTHER)

## 2024-03-22 ENCOUNTER — Encounter (INDEPENDENT_AMBULATORY_CARE_PROVIDER_SITE_OTHER): Payer: Self-pay

## 2024-03-22 ENCOUNTER — Ambulatory Visit (INDEPENDENT_AMBULATORY_CARE_PROVIDER_SITE_OTHER): Admitting: Audiology

## 2024-03-22 VITALS — BP 124/75 | HR 74

## 2024-03-22 DIAGNOSIS — J383 Other diseases of vocal cords: Secondary | ICD-10-CM

## 2024-03-22 DIAGNOSIS — H903 Sensorineural hearing loss, bilateral: Secondary | ICD-10-CM

## 2024-03-22 DIAGNOSIS — R490 Dysphonia: Secondary | ICD-10-CM

## 2024-03-22 DIAGNOSIS — H90A21 Sensorineural hearing loss, unilateral, right ear, with restricted hearing on the contralateral side: Secondary | ICD-10-CM

## 2024-03-22 DIAGNOSIS — H9313 Tinnitus, bilateral: Secondary | ICD-10-CM

## 2024-03-22 DIAGNOSIS — H90A22 Sensorineural hearing loss, unilateral, left ear, with restricted hearing on the contralateral side: Secondary | ICD-10-CM

## 2024-03-22 DIAGNOSIS — H918X3 Other specified hearing loss, bilateral: Secondary | ICD-10-CM

## 2024-03-22 NOTE — Progress Notes (Signed)
 Dear Dr. Job, Here is my assessment for our mutual patient, Amanda Rollins. Thank you for allowing me the opportunity to care for your patient. Please do not hesitate to contact me should you have any other questions. Sincerely, Dr. Penne Croak  Otolaryngology Clinic Note Referring provider: Dr. Job HPI:  Discussed the use of AI scribe software for clinical note transcription with the patient, who gave verbal consent to proceed.  History of Present Illness Amanda Rollins is a 73 year old female who presents with hearing loss and vocal cord issues.  Hearing loss and tinnitus - Difficulty hearing, particularly initial consonants - Buzzing sound in both ears - No significant history of noise exposure, except for occasional loud environments such as certain stores - Difficulty hearing conversations, especially with family - Concern about impact on ability to sing, an activity she enjoys - Ear clogging during flights, relieved by Afrin and use of 'ear planes' ear plugs - Currently using Flonase  for fluid in ears, prescribed by her PA  Dysphonia and vocal cord dysfunction - History of incomplete vocal cord closure, described as a 'slit' on prior laryngoscopic evaluation - Voice cuts out or produces abnormal sounds, especially during winter - Symptoms impact ability to sing - Vocal therapy has been beneficial; continues to perform exercises learned in therapy  Nasal congestion - Occasional nasal congestion, particularly in the mornings - Generally able to breathe well through nose     Independent Review of Additional Tests or Records:  Reviewed external note from referring PCP, [place],describing relevant history incorporated into today's evaluation. I personally reviewed and interpreted audiogram- bilateral normal hearing downsloping to profound SNHL in the HF. Word rec 96% AU. Type A tymp AU. 25db difference at 4k left worse than right.      PMH/Meds/All/SocHx/FamHx/ROS:   Past Medical History:  Diagnosis Date   Breast cancer (HCC) 2011   Diabetes mellitus type 2, controlled (HCC)    Heart murmur    Hyperlipidemia    Hypertension    Hypothyroidism      Past Surgical History:  Procedure Laterality Date   ABDOMINAL HYSTERECTOMY  2000   Still has ovaries   BELPHAROPTOSIS REPAIR  07/2019   BREAST SURGERY     CATARACT EXTRACTION  2019   Left eye 2019, Right eye 2020   CESAREAN SECTION  12/08/1990   MASTECTOMY  2011   Bilateral   ROBOTIC ASSISTED LAPAROSCOPIC VAGINAL HYSTERECTOMY WITH FIBROID REMOVAL  1991    Family History  Adopted: Yes  Problem Relation Age of Onset   Cancer Mother        small cell cancer of liver and lungs     Social Connections: Moderately Integrated (11/24/2023)   Social Connection and Isolation Panel    Frequency of Communication with Friends and Family: More than three times a week    Frequency of Social Gatherings with Friends and Family: More than three times a week    Attends Religious Services: More than 4 times per year    Active Member of [employer] or Organizations: Yes    Attends Banker Meetings: 1 to 4 times per year    Marital Status: Widowed      Current Outpatient Medications:    alendronate  (FOSAMAX ) 70 MG tablet, TAKE 1 TABLET(70 MG) BY MOUTH 1 TIME A WEEK WITH A FULL GLASS OF WATER AND ON AN EMPTY STOMACH, Disp: 12 tablet, Rfl: 3   calcium -vitamin D  (OSCAL WITH D) 250-125 MG-UNIT tablet, Take 1  tablet by mouth daily., Disp: , Rfl:    Continuous Glucose Sensor (FREESTYLE LIBRE 3 PLUS SENSOR) MISC, USE AS DIRECTED CHANGE SENSOR EVERY 15 DAYS, Disp: 2 each, Rfl: 2   diclofenac  (VOLTAREN ) 50 MG EC tablet, TAKE 1 TABLET(50 MG) BY MOUTH TWICE DAILY, Disp: 30 tablet, Rfl: 1   gabapentin  (NEURONTIN ) 300 MG capsule, TAKE 1 CAPSULE(300 MG) BY MOUTH AT BEDTIME, Disp: 90 capsule, Rfl: 1   JARDIANCE  25 MG TABS tablet, TAKE 1 TABLET(25 MG) BY MOUTH DAILY, Disp: 90  tablet, Rfl: 1   levothyroxine  (SYNTHROID ) 50 MCG tablet, TAKE 1 TABLET(50 MCG) BY MOUTH DAILY, Disp: 90 tablet, Rfl: 1   losartan -hydrochlorothiazide (HYZAAR) 100-12.5 MG tablet, TAKE 1 TABLET BY MOUTH DAILY, Disp: 90 tablet, Rfl: 1   magnesium gluconate (MAGONATE) 500 MG tablet, Take 500 mg by mouth 2 (two) times daily., Disp: , Rfl:    metFORMIN  (GLUCOPHAGE -XR) 500 MG 24 hr tablet, TAKE 1 TABLET(500 MG) BY MOUTH DAILY WITH BREAKFAST, Disp: 90 tablet, Rfl: 1   metoprolol  succinate (TOPROL -XL) 25 MG 24 hr tablet, TAKE 1 TABLET(25 MG) BY MOUTH DAILY, Disp: 30 tablet, Rfl: 0   omeprazole (PRILOSEC) 10 MG capsule, Take 10 mg by mouth daily., Disp: , Rfl:    rosuvastatin  (CRESTOR ) 5 MG tablet, TAKE 1 TABLET(5 MG) BY MOUTH DAILY, Disp: 90 tablet, Rfl: 1   traZODone  (DESYREL ) 50 MG tablet, TAKE 1/2 TO 1 TABLET(25 TO 50 MG) BY MOUTH AT BEDTIME AS NEEDED FOR SLEEP, Disp: 30 tablet, Rfl: 3   Physical Exam:   BP 124/75 (BP Location: Left Arm, Patient Position: Sitting)   Pulse 74   SpO2 95%   The patient was awake, alert, and appropriate. The external ears were inspected, and otoscopy was performed to evaluate the external auditory canals and tympanic membranes. The nasal cavity and septum were examined for mucosal changes, obstruction, or discharge. The oral cavity and oropharynx were inspected for mucosal lesions, infection, or tonsillar hypertrophy. The neck was palpated for lymphadenopathy, thyroid  abnormalities, or other masses. Cranial nerve function was grossly intact.  Pertinent Findings: Physical Exam HEENT: Atraumatic, normocephalic. Cerumen in left ear. Nasal septum deviated to the right. Oral cavity normal. Normal neck examination.   Seprately Identifiable Procedures:  I personally ordered, reviewed and interpreted the following with the patient today  Procedure: Bilateral ear microscopy using microscope (CPT [phone]) Pre-procedure diagnosis: SNHL Post-procedure diagnosis:  same Indication: see above; given patient's otologic complaints and history, for improved and comprehensive examination of external ear and tympanic membrane, bilateral otologic examination using microscope was performed. Prior to proceeding, verbal consent was obtained after discussion of R/B/A  Procedure: Patient was placed semi-recumbent. Both ear canals were examined using the microscope with findings below. Patient tolerated the procedure well.  Right ear:  No significant lesions pinna. EAC: no significant lesions. Canal is clear. Eczematoid changes. minimal TM: Intact   Left ear:  No significant lesions pinna. EAC: no significant lesions. Canal is clear. Eczematoid changes. minimal TM: Intact   Impression & Plans:  Amanda Rollins is a 73 y.o. female  1. Asymmetrical hearing loss   2. 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 Sensorineural hearing loss (SNHL) of left ear with restricted hearing of right ear   4. Tinnitus of both ears    - Findings and diagnoses discussed in detail with the patient. - Risks, benefits, and alternatives were reviewed. Through shared decision making, the patient elects to proceed with  below. Assessment & Plan Bilateral sensorineural hearing loss Severe bilateral sensorineural hearing loss with asymmetry between ears. Further investigation required. - Ordered MRI to evaluate ear asymmetry. - Provided list of hearing aid providers. - Advised Afrin use before flights to alleviate ear clogging. - Recommended ear planes during flights.  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 Vocal therapy beneficial. No surgery needed. - Continue vocal therapy sessions. - Advised staying hydrated, especially in dry conditions.  - Orders placed:  Orders Placed This Encounter  Procedures   MR BRAIN/IAC W WO CONTRAST   - Medications prescribed/continued/adjusted: No orders of the  defined types were placed in this encounter.  - Education materials provided to the patient. - Follow up: after MRI - 6-8 weeks. Patient instructed to return sooner or go to the ED if new/worsening symptoms develop.  Thank you for allowing me the opportunity to care for your patient. Please do not hesitate to contact me should you have any other questions.  Sincerely, Penne Croak, DO Otolaryngologist (ENT) [facility] Phone: [phone] Fax: [phone]  03/22/2024, 7:45 PM

## 2024-03-22 NOTE — Progress Notes (Signed)
  [address], [address] [place], [place] [address] [phone]  Audiological Evaluation    Name: Amanda Rollins     DOB:   1950/12/17      MRN:   [id number]                                                                                     Service Date: 03/22/2024     Accompanied by: self   Patient comes today after Dr. Anice, ENT sent a referral for a hearing evaluation due to concerns with hearing loss.   Symptoms Yes Details  Hearing loss  [x]  Both ears for at least 10 years, unsure why  Tinnitus  [x]  Both ears, buzzing sound, slightly more in the right ear  Ear pain/ infections/pressure  []    Balance problems  []    Noise exposure history  []    Previous ear surgeries  []    Family history of hearing loss  []    Amplification  []    Other  []      Otoscopy: Right ear: Clear external ear canal and notable landmarks visualized on the tympanic membrane. Left ear:  Clear external ear canal and notable landmarks visualized on the tympanic membrane.  Tympanometry: Right ear: Normal external ear canal volume with normal middle ear pressure and tympanic membrane compliance (Type A). Findings are suggestive of normal middle ear function. Left ear: Normal external ear canal volume with normal middle ear pressure and tympanic membrane compliance (Type A). Findings are suggestive of normal middle ear function.   Hearing Evaluation The hearing test results were completed under headphones and results are deemed to be of good reliability. Test technique:  conventional    Pure tone Audiometry: Right ear- Hearing within normal limits at 250 Hz and then mild sloping to profound sensorineural hearing loss from 500 Hz - 8000 Hz. Left ear-  Hearing within normal limits at 250-500 Hz and then mild sloping to profound sensorineural hearing loss from 1000 Hz - 8000 Hz.  Speech Audiometry: Right ear- Speech Reception Threshold (SRT) was obtained at 30 dBHL. Left ear-Speech Reception  Threshold (SRT) was obtained at 30 dBHL.   Word Recognition Score Tested using NU-6 (recorded) Right ear: 96% was obtained at a presentation level of 70 dBHL with contralateral masking which is deemed as  excellent. Left ear: 96% was obtained at a presentation level of 70 dBHL with contralateral masking which is deemed as  excellent.   Impression: There is a significant difference in pure-tone thresholds between ears. There is not a significant difference in the word recognition score in between ears.    Recommendations: Follow up with ENT as scheduled for today. Return for a hearing evaluation if concerns with hearing changes arise or per MD recommendation., Consider a communication needs assessment after medical clearance for hearing aids is obtained.   Dominyk Law MARIE LEROUX-MARTINEZ, AUD

## 2024-03-25 NOTE — Progress Notes (Signed)
 Amanda Rollins                                          MRN: [id number]   03/25/2024   The [facility] Quality Team Specialist reviewed this patient medical record for the purposes of chart review for care gap closure. The following were reviewed: chart review for care gap closure-glycemic status assessment and kidney health evaluation for diabetes:eGFR  and uACR.    [facility] Quality Team

## 2024-03-26 ENCOUNTER — Other Ambulatory Visit: Payer: Self-pay | Admitting: Physician Assistant

## 2024-03-29 ENCOUNTER — Ambulatory Visit

## 2024-03-29 ENCOUNTER — Ambulatory Visit: Admitting: Sports Medicine

## 2024-03-29 VITALS — BP 132/76 | HR 79 | Ht 64.0 in | Wt 182.0 lb

## 2024-03-29 DIAGNOSIS — M19011 Primary osteoarthritis, right shoulder: Secondary | ICD-10-CM

## 2024-03-29 DIAGNOSIS — M25511 Pain in right shoulder: Secondary | ICD-10-CM | POA: Diagnosis not present

## 2024-03-29 DIAGNOSIS — G5603 Carpal tunnel syndrome, bilateral upper limbs: Secondary | ICD-10-CM | POA: Diagnosis not present

## 2024-03-29 DIAGNOSIS — G8929 Other chronic pain: Secondary | ICD-10-CM | POA: Diagnosis not present

## 2024-03-29 MED ORDER — CELECOXIB 200 MG PO CAPS: 200.0000 mg | ORAL_CAPSULE | Freq: Two times a day (BID) | ORAL | 0 refills | Status: DC

## 2024-03-29 NOTE — Patient Instructions (Addendum)
-   Start Celebrex  200 mg daily x2 .  If still having pain after 2 weeks, complete 3rd-week of NSAID. May use remaining NSAID as needed once daily for pain control.  Do not to use additional over-the-counter NSAIDs (ibuprofen, naproxen, Advil, Aleve, etc.) while taking prescription NSAIDs.  May use Tylenol [phone] mg 2 to 3 times a day for breakthrough pain.  Shoulder HEP   4-6 week follow up

## 2024-03-29 NOTE — Progress Notes (Signed)
 Ben Sanaiyah Kirchhoff D.[facility] [address] [place] [address] Phone: [phone]   Assessment and Plan:     1. Chronic right shoulder pain (Primary) 2. Primary osteoarthritis of right shoulder -Chronic with exacerbation, initial sports medicine visit - Chronic right shoulder pain with worsening flare over the past 1 to 2 weeks consistent with flare of severe osteoarthritis with history of humeral neck fracture - X-ray obtained in clinic.  My interpretation: No acute fracture or dislocation.  Severe glenohumeral degenerative changes.  Mild AC joint degenerative changes -Start HEP for shoulder to maximize ROM and strength - Start Celebrex  200 mg twice daily x2 weeks.  If still having pain after 2 weeks, complete 3rd-week of NSAID. May use remaining NSAID as needed once daily for pain control.  Do not to use additional over-the-counter NSAIDs (ibuprofen, naproxen, Advil, Aleve, etc.) while taking prescription NSAIDs.  May use Tylenol [phone] mg 2 to 3 times a day for breakthrough pain.   15 additional minutes spent for educating Therapeutic Home Exercise Program.  This included exercises focusing on stretching, strengthening, with focus on eccentric aspects.   Long term goals include an improvement in range of motion, strength, endurance as well as avoiding reinjury. Patient's frequency would include in 1-2 times a day, 3-5 times a week for a duration of 6-12 weeks. Proper technique shown and discussed handout in great detail with ATC.  All questions were discussed and answered.     3.  Bilateral carpal tunnel syndrome - Chronic with exacerbation, initial visit - Consistent with bilateral carpal tunnel likely flared by activities such as gaming on phone  -Start HEP for carpal tunnel - Start Celebrex  200 mg twice daily x2 weeks.  If still having pain after 2 weeks, complete 3rd-week of NSAID. May use remaining NSAID as needed once daily for pain control.   Do not to use additional over-the-counter NSAIDs (ibuprofen, naproxen, Advil, Aleve, etc.) while taking prescription NSAIDs.  May use Tylenol [phone] mg 2 to 3 times a day for breakthrough pain.   Pertinent previous records reviewed include none   Follow Up: 4 to 6 weeks for reevaluation.  Could consider subacromial versus intra-articular CSI versus carpal tunnel CSI.  Patient is needle phobic.   Subjective:   I, Chestine Reeves, am serving as a neurosurgeon for Doctor Morene Mace  Chief Complaint: right shoulder pain  HPI:   03/29/24 Patient is a 73 year old female with right shoulder pain. Patient states intermittent pain for years. Pain has increase over the lat week and a half. She was dog sitting and sat on a weird couch. Pain radiates up to the neck . Tylenol and ibu don't really help. Some numbness or tingling (ulnar nerve) when she drives. Pain when she sleeps through the night. Decreased ROM.   Relevant Historical Information: History of breast cancer treated with chemo and radiation in 2011, hypertension, DM type II  Additional pertinent review of systems negative.   Current Outpatient Medications:    celecoxib  (CELEBREX ) 200 MG capsule, Take 1 capsule (200 mg total) by mouth 2 (two) times daily., Disp: 60 capsule, Rfl: 0   alendronate  (FOSAMAX ) 70 MG tablet, TAKE 1 TABLET(70 MG) BY MOUTH 1 TIME A WEEK WITH A FULL GLASS OF WATER AND ON AN EMPTY STOMACH, Disp: 12 tablet, Rfl: 3   calcium -vitamin D  (OSCAL WITH D) 250-125 MG-UNIT tablet, Take 1 tablet by mouth daily., Disp: , Rfl:    Continuous Glucose Sensor (FREESTYLE LIBRE  3 PLUS SENSOR) MISC, USE AS DIRECTED CHANGE SENSOR EVERY 15 DAYS, Disp: 2 each, Rfl: 2   diclofenac  (VOLTAREN ) 50 MG EC tablet, TAKE 1 TABLET(50 MG) BY MOUTH TWICE DAILY, Disp: 30 tablet, Rfl: 1   gabapentin  (NEURONTIN ) 300 MG capsule, TAKE 1 CAPSULE(300 MG) BY MOUTH AT BEDTIME, Disp: 90 capsule, Rfl: 1   JARDIANCE  25 MG TABS tablet, TAKE 1 TABLET(25 MG) BY  MOUTH DAILY, Disp: 90 tablet, Rfl: 1   levothyroxine  (SYNTHROID ) 50 MCG tablet, TAKE 1 TABLET(50 MCG) BY MOUTH DAILY, Disp: 90 tablet, Rfl: 1   losartan -hydrochlorothiazide (HYZAAR) 100-12.5 MG tablet, TAKE 1 TABLET BY MOUTH DAILY, Disp: 90 tablet, Rfl: 1   magnesium gluconate (MAGONATE) 500 MG tablet, Take 500 mg by mouth 2 (two) times daily., Disp: , Rfl:    metFORMIN  (GLUCOPHAGE -XR) 500 MG 24 hr tablet, TAKE 1 TABLET(500 MG) BY MOUTH DAILY WITH BREAKFAST, Disp: 90 tablet, Rfl: 1   metoprolol  succinate (TOPROL -XL) 25 MG 24 hr tablet, TAKE 1 TABLET(25 MG) BY MOUTH DAILY, Disp: 30 tablet, Rfl: 0   omeprazole (PRILOSEC) 10 MG capsule, Take 10 mg by mouth daily., Disp: , Rfl:    rosuvastatin  (CRESTOR ) 5 MG tablet, TAKE 1 TABLET(5 MG) BY MOUTH DAILY, Disp: 90 tablet, Rfl: 1   traZODone  (DESYREL ) 50 MG tablet, TAKE 1/2 TO 1 TABLET(25 TO 50 MG) BY MOUTH AT BEDTIME AS NEEDED FOR SLEEP, Disp: 30 tablet, Rfl: 3   Objective:     Vitals:   03/29/24 1323  BP: 132/76  Pulse: 79  SpO2: 99%  Weight: 182 lb (82.6 kg)  Height: 5' 4 (1.626 m)      Body mass index is 31.24 kg/m.    Physical Exam:    Gen: Appears well, nad, nontoxic and pleasant Neuro:sensation intact, strength is 5/5, muscle tone wnl Skin: no suspicious lesion or defmority Psych: A&O, appropriate mood and affect  Right shoulder:  No deformity, swelling or muscle wasting No scapular winging FF 80, abd 80, int 30, ext 60 TTP biceps groove, coracoid, AC, humerus, deltoid, trapezius NTTP over the [place], cervical spine Negative Spurling's test bilat FROM of neck  Bilateral wrist Positive Phalen, prayer, Tinel bilaterally, worse on left   Electronically signed by:  Odis Mace D.[facility] 1:47 PM 03/29/24

## 2024-04-01 ENCOUNTER — Other Ambulatory Visit: Payer: Self-pay | Admitting: Physician Assistant

## 2024-04-01 ENCOUNTER — Ambulatory Visit

## 2024-04-02 ENCOUNTER — Ambulatory Visit: Payer: Self-pay | Admitting: Sports Medicine

## 2024-04-05 ENCOUNTER — Encounter: Payer: Self-pay | Admitting: Physician Assistant

## 2024-04-05 ENCOUNTER — Ambulatory Visit: Admitting: Physician Assistant

## 2024-04-05 VITALS — BP 136/80 | HR 74 | Temp 97.3°F | Ht 64.0 in | Wt 179.4 lb

## 2024-04-05 DIAGNOSIS — M25511 Pain in right shoulder: Secondary | ICD-10-CM | POA: Diagnosis not present

## 2024-04-05 DIAGNOSIS — E1165 Type 2 diabetes mellitus with hyperglycemia: Secondary | ICD-10-CM

## 2024-04-05 DIAGNOSIS — Z23 Encounter for immunization: Secondary | ICD-10-CM | POA: Diagnosis not present

## 2024-04-05 DIAGNOSIS — H9191 Unspecified hearing loss, right ear: Secondary | ICD-10-CM | POA: Diagnosis not present

## 2024-04-05 DIAGNOSIS — G5603 Carpal tunnel syndrome, bilateral upper limbs: Secondary | ICD-10-CM | POA: Diagnosis not present

## 2024-04-05 DIAGNOSIS — G8929 Other chronic pain: Secondary | ICD-10-CM | POA: Diagnosis not present

## 2024-04-05 DIAGNOSIS — Z7901 Long term (current) use of anticoagulants: Secondary | ICD-10-CM | POA: Diagnosis not present

## 2024-04-05 DIAGNOSIS — Z7984 Long term (current) use of oral hypoglycemic drugs: Secondary | ICD-10-CM | POA: Diagnosis not present

## 2024-04-05 LAB — COMPREHENSIVE METABOLIC PANEL WITH GFR
ALT: 13 U/L (ref 0–35)
AST: 13 U/L (ref 0–37)
Albumin: 4.4 g/dL (ref 3.5–5.2)
Alkaline Phosphatase: 34 U/L — ABNORMAL LOW (ref 39–117)
BUN: 23 mg/dL (ref 6–23)
CO2: 28 meq/L (ref 19–32)
Calcium: 9.5 mg/dL (ref 8.4–10.5)
Chloride: 101 meq/L (ref 96–112)
Creatinine, Ser: 0.7 mg/dL (ref 0.40–1.20)
GFR: 84.51 mL/min (ref >60.00)
Glucose, Bld: 173 mg/dL — ABNORMAL HIGH (ref 70–99)
Potassium: 4 meq/L (ref 3.5–5.1)
Sodium: 136 meq/L (ref 135–145)
Total Bilirubin: 0.6 mg/dL (ref 0.2–1.2)
Total Protein: 7.1 g/dL (ref 6.0–8.3)

## 2024-04-05 LAB — CBC WITH DIFFERENTIAL/PLATELET
Basophils Absolute: 0 K/uL (ref 0.0–0.1)
Basophils Relative: 0.7 % (ref 0.0–3.0)
Eosinophils Absolute: 0.3 K/uL (ref 0.0–0.7)
Eosinophils Relative: 4.4 % (ref 0.0–5.0)
HCT: 41.8 % (ref 36.0–46.0)
Hemoglobin: 13.9 g/dL (ref 12.0–15.0)
Lymphocytes Relative: 21.9 % (ref 12.0–46.0)
Lymphs Abs: 1.4 K/uL (ref 0.7–4.0)
MCHC: 33.3 g/dL (ref 30.0–36.0)
MCV: 86.2 fl (ref 78.0–100.0)
Monocytes Absolute: 0.6 K/uL (ref 0.1–1.0)
Monocytes Relative: 9.8 % (ref 3.0–12.0)
Neutro Abs: 4.1 K/uL (ref 1.4–7.7)
Neutrophils Relative %: 63.2 % (ref 43.0–77.0)
Platelets: 205 K/uL (ref 150.0–400.0)
RBC: 4.85 Mil/uL (ref 3.87–5.11)
RDW: 14.3 % (ref 11.5–15.5)
WBC: 6.6 K/uL (ref 4.0–10.5)

## 2024-04-05 LAB — HEMOGLOBIN A1C: Hgb A1c MFr Bld: 8.5 % — ABNORMAL HIGH (ref 4.6–6.5)

## 2024-04-05 LAB — MICROALBUMIN / CREATININE URINE RATIO
Creatinine,U: 27 mg/dL
Microalb Creat Ratio: UNDETERMINED mg/g (ref 0.0–30.0)
Microalb, Ur: <0.7 mg/dL

## 2024-04-05 LAB — LIPID PANEL
Cholesterol: 144 mg/dL (ref 0–200)
HDL: 47.3 mg/dL (ref >39.00)
LDL Cholesterol: 68 mg/dL (ref 0–99)
NonHDL: 96.62
Total CHOL/HDL Ratio: 3
Triglycerides: 145 mg/dL (ref 0.0–149.0)
VLDL: 29 mg/dL (ref 0.0–40.0)

## 2024-04-05 MED ORDER — DEXCOM G7 SENSOR MISC: 2 refills | Status: AC

## 2024-04-05 MED ORDER — ALPRAZOLAM 0.5 MG PO TABS: ORAL_TABLET | ORAL | 0 refills | Status: AC

## 2024-04-05 NOTE — Progress Notes (Signed)
 History of Present Illness:   Chief Complaint  Patient presents with   Shoulder Pain    Pt c/o right shoulder pain, worse past 2 weeks.    Discussed the use of AI scribe software for clinical note transcription with the patient, who gave verbal consent to proceed.  History of Present Illness   Amanda Rollins is a 73 year old female with right shoulder arthritis who presents with uncontrolled shoulder pain.  She has severe right shoulder pain with significant arthritis on prior x-ray and describes the joint as a total mess. She has shooting pain and difficulty lifting her arm. Celebrex  and gabapentin  have not helped, and she is currently taking diclofenac . She has avoided recommended exercises due to pain and limited time.  She has carpal tunnel syndrome, worse in the left hand, with numbness and tingling. An injection was recommended, but she is hesitant because she dislikes needles.  She has hearing problems, mainly in the right ear. She is scheduled for an MRI to evaluate this and has claustrophobia. She is requesting Xanax  to manage anxiety for the MRI.  She has diabetes treated with Jardiance  25 mg daily and metformin  XR 500 mg daily, which she tolerates. Her freestyle libre glucose monitor frequently malfunctions, and she is interested in trying a different device. Her last known A1c was 10, and she is awaiting new lab results to reassess control.        Past Medical History:  Diagnosis Date   Breast cancer (HCC) 2011   Diabetes mellitus type 2, controlled (HCC)    Heart murmur    Hyperlipidemia    Hypertension    Hypothyroidism      Social History[1]  Past Surgical History:  Procedure Laterality Date   ABDOMINAL HYSTERECTOMY  2000   Still has ovaries   BELPHAROPTOSIS REPAIR  07/2019   BREAST SURGERY     CATARACT EXTRACTION  2019   Left eye 2019, Right eye 2020   CESAREAN SECTION  12/08/1990   MASTECTOMY  2011   Bilateral    ROBOTIC ASSISTED LAPAROSCOPIC VAGINAL HYSTERECTOMY WITH FIBROID REMOVAL  1991    Family History  Adopted: Yes  Problem Relation Age of Onset   Cancer Mother        small cell cancer of liver and lungs    Allergies[2]  Current Medications:  Current Medications[3]   Review of Systems:   Negative unless otherwise specified per HPI.  Vitals:   Vitals:   04/05/24 1021  BP: 136/80  Pulse: 74  Temp: (!) 97.3 F (36.3 C)  TempSrc: Temporal  SpO2: 97%  Weight: 179 lb 6.1 oz (81.4 kg)  Height: 5' 4 (1.626 m)     Body mass index is 30.79 kg/m.  Physical Exam:   Physical Exam Vitals and nursing note reviewed.  Constitutional:      General: She is not in acute distress.    Appearance: She is well-developed. She is not ill-appearing or toxic-appearing.  Cardiovascular:     Rate and Rhythm: Normal rate and regular rhythm.     Pulses: Normal pulses.     Heart sounds: Normal heart sounds, S1 normal and S2 normal.  Pulmonary:     Effort: Pulmonary effort is normal.     Breath sounds: Normal breath sounds.  Skin:    General: Skin is warm and dry.  Neurological:     Mental Status: She is alert.     GCS: GCS eye subscore is 4. GCS  verbal subscore is 5. GCS motor subscore is 6.  Psychiatric:        Speech: Speech normal.        Behavior: Behavior normal. Behavior is cooperative.     Assessment and Plan:   Assessment and Plan    Chronic right shoulder pain Severe osteoarthritis with significant pain and limited range of motion. Current pain management ineffective. Concerns about lymphedema affecting surgical options. - Referred to Dr. Eva Herring at [facility] for evaluation and x-ray review. - Discontinued Celebrex . - Continue oral diclofenac . - Consider adding topical Voltaren . - Try Tylenol for additional pain relief. - Prescribed Xanax  for MRI and potential injection-related anxiety.  Carpal tunnel syndrome, bilateral  Bilateral carpal tunnel  syndrome, more severe in the left hand, likely exacerbated by frequent phone use. Hesitant about injection therapy due to needle aversion. - Will consider injection therapy if symptoms worsen. Defer to sports medicine.  Type 2 diabetes mellitus with hyperglycemia, without long-term current use of insulin (HCC)  Type 2 diabetes with previous A1c of 10. Issues with Levo sensors. Currently on Jardiance  and metformin , with good tolerance to metformin . - Ordered blood work to check A1c and other relevant parameters. - Prescribed Dexcom as an alternative to [place] sensors. - Continue Jardiance  and metformin .  Hearing loss, right ear Hearing loss. Concern for potential underlying mass or other pathology. - Proceed with scheduled MRI to evaluate for potential mass or other causes of hearing loss.  General Health Maintenance Received flu shot recently. Declined COVID vaccination. - Continue routine health maintenance.         Lucie Buttner, PA-C      [1] Social History Tobacco Use   Smoking status: Former    Types: Cigarettes    Start date: 02/07/1970   Smokeless tobacco: Never  Substance Use Topics   Alcohol use: Not Currently    Alcohol/week: 3.0 standard drinks of alcohol    Types: 3 Glasses of wine per week   Drug use: Never  [2] Allergies Allergen Reactions   Sulfa Antibiotics Rash   Metformin  Diarrhea  [3]  Current Outpatient Medications:    alendronate  (FOSAMAX ) 70 MG tablet, TAKE 1 TABLET(70 MG) BY MOUTH 1 TIME A WEEK WITH A FULL GLASS OF WATER AND ON AN EMPTY STOMACH, Disp: 12 tablet, Rfl: 3   ALPRAZolam  (XANAX ) 0.5 MG tablet, Take 1-2 tablets 30 minutes prior to procedure or testing., Disp: 4 tablet, Rfl: 0   calcium -vitamin D  (OSCAL WITH D) 250-125 MG-UNIT tablet, Take 1 tablet by mouth daily., Disp: , Rfl:    Continuous Glucose Sensor (DEXCOM G7 SENSOR) MISC, Apply one sensor every 15 days, Disp: 2 each, Rfl: 2   diclofenac  (VOLTAREN ) 50 MG EC tablet,  TAKE 1 TABLET(50 MG) BY MOUTH TWICE DAILY, Disp: 30 tablet, Rfl: 1   gabapentin  (NEURONTIN ) 300 MG capsule, TAKE 1 CAPSULE(300 MG) BY MOUTH AT BEDTIME, Disp: 90 capsule, Rfl: 1   JARDIANCE  25 MG TABS tablet, TAKE 1 TABLET(25 MG) BY MOUTH DAILY, Disp: 90 tablet, Rfl: 1   levothyroxine  (SYNTHROID ) 50 MCG tablet, TAKE 1 TABLET(50 MCG) BY MOUTH DAILY, Disp: 90 tablet, Rfl: 1   losartan -hydrochlorothiazide (HYZAAR) 100-12.5 MG tablet, TAKE 1 TABLET BY MOUTH DAILY, Disp: 90 tablet, Rfl: 1   magnesium gluconate (MAGONATE) 500 MG tablet, Take 500 mg by mouth 2 (two) times daily., Disp: , Rfl:    metFORMIN  (GLUCOPHAGE -XR) 500 MG 24 hr tablet, TAKE 1 TABLET(500 MG) BY MOUTH DAILY WITH BREAKFAST, Disp: 90 tablet, Rfl:  1   metoprolol  succinate (TOPROL -XL) 25 MG 24 hr tablet, TAKE 1 TABLET(25 MG) BY MOUTH DAILY, Disp: 30 tablet, Rfl: 0   omeprazole (PRILOSEC) 10 MG capsule, Take 10 mg by mouth daily., Disp: , Rfl:    rosuvastatin  (CRESTOR ) 5 MG tablet, TAKE 1 TABLET(5 MG) BY MOUTH DAILY, Disp: 90 tablet, Rfl: 1   traZODone  (DESYREL ) 50 MG tablet, TAKE 1/2 TO 1 TABLET(25 TO 50 MG) BY MOUTH AT BEDTIME AS NEEDED FOR SLEEP, Disp: 30 tablet, Rfl: 3

## 2024-04-06 ENCOUNTER — Other Ambulatory Visit: Payer: Self-pay | Admitting: Physician Assistant

## 2024-04-06 ENCOUNTER — Encounter: Payer: Self-pay | Admitting: Physician Assistant

## 2024-04-06 ENCOUNTER — Ambulatory Visit: Payer: Self-pay | Admitting: Physician Assistant

## 2024-04-06 NOTE — Telephone Encounter (Signed)
 Noted

## 2024-04-09 ENCOUNTER — Ambulatory Visit (HOSPITAL_BASED_OUTPATIENT_CLINIC_OR_DEPARTMENT_OTHER)
Admission: RE | Admit: 2024-04-09 | Discharge: 2024-04-09 | Disposition: A | Discharge status: Home / Self Care | Source: Ambulatory Visit

## 2024-04-09 ENCOUNTER — Encounter (HOSPITAL_BASED_OUTPATIENT_CLINIC_OR_DEPARTMENT_OTHER): Payer: Self-pay

## 2024-04-09 DIAGNOSIS — H918X3 Other specified hearing loss, bilateral: Secondary | ICD-10-CM | POA: Diagnosis present

## 2024-04-09 MED ORDER — GADOBUTROL 1 MMOL/ML IV SOLN
7.5000 mL | Freq: Once | INTRAVENOUS | Status: AC | PRN
  Administered 2024-04-09: 7.5 mL via INTRAVENOUS
  Filled 2024-04-09: qty 7.5

## 2024-04-12 DIAGNOSIS — K080 Exfoliation of teeth due to systemic causes: Secondary | ICD-10-CM | POA: Diagnosis not present

## 2024-04-13 ENCOUNTER — Encounter: Payer: Self-pay | Admitting: Physician Assistant

## 2024-04-27 ENCOUNTER — Other Ambulatory Visit: Payer: Self-pay | Admitting: Sports Medicine

## 2024-04-28 ENCOUNTER — Ambulatory Visit: Admitting: Physician Assistant

## 2024-04-30 ENCOUNTER — Ambulatory Visit: Admitting: Physician Assistant

## 2024-05-03 ENCOUNTER — Ambulatory Visit: Admitting: Physician Assistant

## 2024-05-03 ENCOUNTER — Encounter: Payer: Self-pay | Admitting: Physician Assistant

## 2024-05-03 VITALS — BP 130/80 | HR 68 | Temp 98.1°F | Ht 64.0 in | Wt 178.4 lb

## 2024-05-03 DIAGNOSIS — Z7984 Long term (current) use of oral hypoglycemic drugs: Secondary | ICD-10-CM | POA: Diagnosis not present

## 2024-05-03 DIAGNOSIS — M25561 Pain in right knee: Secondary | ICD-10-CM | POA: Diagnosis not present

## 2024-05-03 DIAGNOSIS — E1165 Type 2 diabetes mellitus with hyperglycemia: Secondary | ICD-10-CM

## 2024-05-03 NOTE — Progress Notes (Signed)
 "  History of Present Illness:   Chief Complaint  Patient presents with   Knee Pain    Pt c/o severe right knee pain 2 weeks ago, pain has decreased but still sore.    Discussed the use of AI scribe software for clinical note transcription with the patient, who gave verbal consent to proceed.  History of Present Illness   Amanda Rollins is a 74 year old female who presents with knee pain and shoulder issues.  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 Pain is localized to a specific area of the knee with occasional swelling and no radiation to the thigh. Stairs aggravate symptoms, so she has limited stair use. 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  She has chronic shoulder pain with marked limitation of motion that interferes with daily activities such as drying her hair. A prior cortisone injection from orthopedics gave minimal relief. She was told she has bone spurs and lymphedema, and that these may limit surgical options.  Her current pain regimen includes topical Voltaren , oral diclofenac  twice daily, and gabapentin  300 mg at night. She is unsure gabapentin  helps her knee and shoulder pain but continues to take it.  She is taking Jardiance  25 mg daily and metformin  XR 500 mg daily. Tolerating well. She has been intentionally reducing her sweet intake and moving body more.  Past Medical History:  Diagnosis Date   Breast cancer (HCC) 2011   Diabetes mellitus type 2, controlled (HCC)    Heart murmur    Hyperlipidemia    Hypertension    Hypothyroidism      Social History[1]  Past Surgical History:  Procedure Laterality Date   ABDOMINAL HYSTERECTOMY  2000   Still has ovaries   BELPHAROPTOSIS REPAIR   07/2019   BREAST SURGERY     CATARACT EXTRACTION  2019   Left eye 2019, Right eye 2020   CESAREAN SECTION  12/08/1990   MASTECTOMY  2011   Bilateral   ROBOTIC ASSISTED LAPAROSCOPIC VAGINAL HYSTERECTOMY WITH FIBROID REMOVAL  1991    Family History  Adopted: Yes  Problem Relation Age of Onset   Cancer Mother        small cell cancer of liver and lungs    Allergies[2]  Current Medications:  Current Medications[3]   Review of Systems:   Negative unless otherwise specified per HPI.  Vitals:   Vitals:   05/03/24 1421  BP: 130/80  Pulse: 68  Temp: 98.1 F (36.7 C)  TempSrc: Temporal  SpO2: 99%  Weight: 178 lb 6.1 oz (80.9 kg)  Height: 5' 4 (1.626 m)     Body mass index is 30.62 kg/m.  Physical Exam:   Physical Exam Vitals and nursing note reviewed.  Constitutional:      General: She is not in acute distress.    Appearance: She is well-developed. She is not ill-appearing or toxic-appearing.  Cardiovascular:     Rate and Rhythm: Normal rate and regular rhythm.     Pulses: Normal pulses.     Heart sounds: Normal heart sounds, S1 normal and S2 normal.  Pulmonary:     Effort: Pulmonary effort is normal.     Breath sounds: Normal breath sounds.  Skin:    General: Skin is  warm and dry.  Neurological:     Mental Status: She is alert.     GCS: GCS eye subscore is 4. GCS verbal subscore is 5. GCS motor subscore is 6.  Psychiatric:        Speech: Speech normal.        Behavior: Behavior normal. Behavior is cooperative.     Assessment and Plan:   Assessment and Plan    Right knee pain Pain exacerbated by standing and stairs, minimal relief from previous cortisone injection, limited efficacy of current medications. - Referred to [facility] for further evaluation and management.  Type 2 diabetes mellitus with hyperglycemia, without long-term current use of insulin (HCC)  A1c improved to 8.5% with weight loss and dietary changes. - Continue dietary  modifications and weight management. - Continue Jardiance  25 mg daily and metformin  500 mg XR -- declines changes at this time. Continue to monitor and check Hemoglobin A1c when due   Lucie Buttner, PA-C    [1]  Social History Tobacco Use   Smoking status: Former    Types: Cigarettes    Start date: 02/07/1970   Smokeless tobacco: Never  Substance Use Topics   Alcohol use: Not Currently    Alcohol/week: 3.0 standard drinks of alcohol    Types: 3 Glasses of wine per week   Drug use: Never  [2]  Allergies Allergen Reactions   Sulfa Antibiotics Rash   Metformin  Diarrhea  [3]  Current Outpatient Medications:    alendronate  (FOSAMAX ) 70 MG tablet, TAKE 1 TABLET(70 MG) BY MOUTH 1 TIME A WEEK WITH A FULL GLASS OF WATER AND ON AN EMPTY STOMACH, Disp: 12 tablet, Rfl: 3   ALPRAZolam  (XANAX ) 0.5 MG tablet, Take 1-2 tablets 30 minutes prior to procedure or testing., Disp: 4 tablet, Rfl: 0   calcium -vitamin D  (OSCAL WITH D) 250-125 MG-UNIT tablet, Take 1 tablet by mouth daily., Disp: , Rfl:    Continuous Glucose Sensor (DEXCOM G7 SENSOR) MISC, Apply one sensor every 15 days, Disp: 2 each, Rfl: 2   diclofenac  (VOLTAREN ) 50 MG EC tablet, TAKE 1 TABLET(50 MG) BY MOUTH TWICE DAILY, Disp: 30 tablet, Rfl: 1   gabapentin  (NEURONTIN ) 300 MG capsule, TAKE 1 CAPSULE(300 MG) BY MOUTH AT BEDTIME, Disp: 90 capsule, Rfl: 1   JARDIANCE  25 MG TABS tablet, TAKE 1 TABLET(25 MG) BY MOUTH DAILY, Disp: 90 tablet, Rfl: 1   levothyroxine  (SYNTHROID ) 50 MCG tablet, TAKE 1 TABLET(50 MCG) BY MOUTH DAILY, Disp: 90 tablet, Rfl: 1   losartan -hydrochlorothiazide (HYZAAR) 100-12.5 MG tablet, TAKE 1 TABLET BY MOUTH DAILY, Disp: 90 tablet, Rfl: 1   magnesium gluconate (MAGONATE) 500 MG tablet, Take 500 mg by mouth 2 (two) times daily., Disp: , Rfl:    metFORMIN  (GLUCOPHAGE -XR) 500 MG 24 hr tablet, TAKE 1 TABLET(500 MG) BY MOUTH DAILY WITH BREAKFAST, Disp: 90 tablet, Rfl: 1   metoprolol  succinate (TOPROL -XL) 25 MG 24 hr  tablet, TAKE 1 TABLET(25 MG) BY MOUTH DAILY, Disp: 30 tablet, Rfl: 0   omeprazole (PRILOSEC) 10 MG capsule, Take 10 mg by mouth daily., Disp: , Rfl:    rosuvastatin  (CRESTOR ) 5 MG tablet, TAKE 1 TABLET(5 MG) BY MOUTH DAILY, Disp: 90 tablet, Rfl: 1   traZODone  (DESYREL ) 50 MG tablet, TAKE 1/2 TO 1 TABLET(25 TO 50 MG) BY MOUTH AT BEDTIME AS NEEDED FOR SLEEP, Disp: 30 tablet, Rfl: 3  "

## 2024-05-03 NOTE — Patient Instructions (Addendum)
 SABRA

## 2024-05-04 ENCOUNTER — Other Ambulatory Visit: Payer: Self-pay | Admitting: Physician Assistant

## 2024-05-06 ENCOUNTER — Other Ambulatory Visit: Payer: Self-pay | Admitting: Physician Assistant

## 2024-05-13 ENCOUNTER — Ambulatory Visit

## 2024-05-25 ENCOUNTER — Other Ambulatory Visit: Payer: Self-pay | Admitting: Physician Assistant

## 2024-05-26 ENCOUNTER — Other Ambulatory Visit: Payer: Self-pay | Admitting: Physician Assistant

## 2024-05-28 NOTE — Progress Notes (Signed)
 Amanda Rollins                                          MRN: [id number]   05/28/2024   The [facility] Quality Team Specialist reviewed this patient medical record for the purposes of chart review for care gap closure. The following were reviewed: abstraction for care gap closure-glycemic status assessment and kidney health evaluation for diabetes:eGFR  and uACR.    [facility] Quality Team

## 2024-11-29 ENCOUNTER — Ambulatory Visit
